# Patient Record
Sex: Female | Born: 1945 | Race: White | Hispanic: No | Smoking: Former smoker
Health system: Southern US, Community
[De-identification: ages and names within clinical notes are randomized; demographics above are authoritative.]

## PROBLEM LIST (undated history)

## (undated) DIAGNOSIS — C801 Malignant (primary) neoplasm, unspecified: Secondary | ICD-10-CM

## (undated) DIAGNOSIS — M546 Pain in thoracic spine: Secondary | ICD-10-CM

## (undated) DIAGNOSIS — M810 Age-related osteoporosis without current pathological fracture: Secondary | ICD-10-CM

## (undated) DIAGNOSIS — T7840XA Allergy, unspecified, initial encounter: Secondary | ICD-10-CM

## (undated) DIAGNOSIS — F32A Depression, unspecified: Secondary | ICD-10-CM

## (undated) DIAGNOSIS — I10 Essential (primary) hypertension: Secondary | ICD-10-CM

## (undated) DIAGNOSIS — F329 Major depressive disorder, single episode, unspecified: Secondary | ICD-10-CM

## (undated) DIAGNOSIS — M549 Dorsalgia, unspecified: Secondary | ICD-10-CM

## (undated) DIAGNOSIS — G8929 Other chronic pain: Secondary | ICD-10-CM

## (undated) HISTORY — DX: Allergy, unspecified, initial encounter: T78.40XA

## (undated) HISTORY — DX: Age-related osteoporosis without current pathological fracture: M81.0

## (undated) HISTORY — PX: TOTAL ABDOMINAL HYSTERECTOMY W/ BILATERAL SALPINGOOPHORECTOMY: SHX83

## (undated) HISTORY — DX: Other chronic pain: G89.29

## (undated) HISTORY — DX: Malignant (primary) neoplasm, unspecified: C80.1

## (undated) HISTORY — DX: Pain in thoracic spine: M54.6

## (undated) HISTORY — DX: Essential (primary) hypertension: I10

## (undated) HISTORY — PX: ABDOMINAL HYSTERECTOMY: SHX81

## (undated) HISTORY — DX: Major depressive disorder, single episode, unspecified: F32.9

## (undated) HISTORY — DX: Depression, unspecified: F32.A

## (undated) HISTORY — DX: Dorsalgia, unspecified: M54.9

---

## 1999-07-17 ENCOUNTER — Ambulatory Visit (HOSPITAL_COMMUNITY): Admission: RE | Admit: 1999-07-17 | Discharge: 1999-07-17 | Payer: Self-pay | Admitting: Family Medicine

## 1999-07-17 ENCOUNTER — Encounter: Payer: Self-pay | Admitting: Family Medicine

## 2005-01-22 ENCOUNTER — Ambulatory Visit: Payer: Self-pay | Admitting: Otolaryngology

## 2006-06-11 ENCOUNTER — Ambulatory Visit: Payer: Self-pay | Admitting: Family Medicine

## 2006-12-24 ENCOUNTER — Ambulatory Visit: Payer: Self-pay | Admitting: Gastroenterology

## 2008-01-29 HISTORY — PX: BREAST LUMPECTOMY: SHX2

## 2008-05-04 ENCOUNTER — Ambulatory Visit: Payer: Self-pay | Admitting: Family Medicine

## 2009-10-09 ENCOUNTER — Ambulatory Visit: Payer: Self-pay | Admitting: Internal Medicine

## 2009-10-11 ENCOUNTER — Ambulatory Visit: Payer: Self-pay | Admitting: Family Medicine

## 2009-10-13 ENCOUNTER — Ambulatory Visit: Payer: Self-pay | Admitting: Internal Medicine

## 2009-10-15 ENCOUNTER — Ambulatory Visit: Payer: Self-pay | Admitting: Internal Medicine

## 2009-10-19 ENCOUNTER — Ambulatory Visit: Payer: Self-pay | Admitting: Family Medicine

## 2012-10-15 ENCOUNTER — Ambulatory Visit: Payer: Self-pay | Admitting: Family Medicine

## 2012-11-10 ENCOUNTER — Emergency Department: Payer: Self-pay | Admitting: Emergency Medicine

## 2013-03-19 ENCOUNTER — Ambulatory Visit: Payer: Self-pay | Admitting: Gastroenterology

## 2013-03-19 LAB — HM COLONOSCOPY

## 2014-11-18 ENCOUNTER — Other Ambulatory Visit: Payer: Self-pay | Admitting: Family Medicine

## 2014-11-18 NOTE — Telephone Encounter (Signed)
Called patient, no answer, unable to leave message.

## 2014-11-18 NOTE — Telephone Encounter (Signed)
Needs an appointment. Will get her enough medicine to make it to appointment when it's booked.   

## 2014-11-21 NOTE — Telephone Encounter (Signed)
No answer, voicemail left to notify patient that she needs an appointment scheduled, then enough medication will be sent to pharmacy.

## 2014-11-23 ENCOUNTER — Other Ambulatory Visit: Payer: Self-pay

## 2014-11-23 MED ORDER — METOPROLOL SUCCINATE ER 25 MG PO TB24: 25.0000 mg | ORAL_TABLET | Freq: Every day | ORAL | Status: DC

## 2014-11-23 MED ORDER — BENAZEPRIL HCL 40 MG PO TABS: 40.0000 mg | ORAL_TABLET | Freq: Every day | ORAL | Status: DC

## 2014-11-23 NOTE — Telephone Encounter (Signed)
Pt scheduled for oct 31 and would like meds to go to [facility]

## 2014-11-23 NOTE — Telephone Encounter (Signed)
No answer, left voicemail to notify patient that she needs an appointment then the medication will be sent over

## 2014-11-23 NOTE — Telephone Encounter (Addendum)
LAST VISIT: 12/16/2013 PRACTICE PARTNER: [id number]  Patient requests benazepril hcl 40 mg tab and metoprolol succ 25 mg tab.

## 2014-11-23 NOTE — Telephone Encounter (Signed)
Your patient.  Thanks 

## 2014-11-28 ENCOUNTER — Ambulatory Visit (INDEPENDENT_AMBULATORY_CARE_PROVIDER_SITE_OTHER): Payer: Managed Care, Other (non HMO) | Admitting: Family Medicine

## 2014-11-28 ENCOUNTER — Encounter: Payer: Self-pay | Admitting: Family Medicine

## 2014-11-28 VITALS — BP 139/84 | HR 69 | Temp 98.7°F | Ht 59.4 in | Wt 113.0 lb

## 2014-11-28 DIAGNOSIS — Z23 Encounter for immunization: Secondary | ICD-10-CM | POA: Diagnosis not present

## 2014-11-28 DIAGNOSIS — M545 Low back pain, unspecified: Secondary | ICD-10-CM | POA: Insufficient documentation

## 2014-11-28 DIAGNOSIS — I10 Essential (primary) hypertension: Secondary | ICD-10-CM | POA: Insufficient documentation

## 2014-11-28 DIAGNOSIS — M5442 Lumbago with sciatica, left side: Secondary | ICD-10-CM | POA: Diagnosis not present

## 2014-11-28 MED ORDER — TRAMADOL HCL 50 MG PO TABS: 50.0000 mg | ORAL_TABLET | Freq: Four times a day (QID) | ORAL | Status: DC | PRN

## 2014-11-28 MED ORDER — SERTRALINE HCL 50 MG PO TABS: 50.0000 mg | ORAL_TABLET | Freq: Every day | ORAL | Status: DC

## 2014-11-28 NOTE — Assessment & Plan Note (Signed)
Discuss back pain care and treatment Exercise regularly Limit use of tramadol and cautions may mix with Tylenol Patient can use Advil or Aleve because of some upset stomach.

## 2014-11-28 NOTE — Assessment & Plan Note (Signed)
The current medical regimen is effective;  continue present plan and medications.  

## 2014-11-28 NOTE — Progress Notes (Signed)
BP 139/84 mmHg  Pulse 69  Temp(Src) 98.7 F (37.1 C)  Ht 4' 11.4" (1.509 m)  Wt 113 lb (51.256 kg)  BMI 22.51 kg/m2  SpO2 97%   Subjective:    Patient ID: Casey Summers, female    DOB: March 12, 1945, 69 y.o.   MRN: [id number]  HPI: Casey Summers is a 69 y.o. female  Chief Complaint  Patient presents with  . Hypertension    medication refill  . Depression    medication refill   patient follow-up blood pressures been doing well taking medications faithfully no side effects Depression doing well on sertraline with no side effects sleeping well. pt now retired and doing better Bothered now primarily with some mild left low back pain with slight radiation into left hip area no other numbness irritation is a little better with walking takes tramadol half a tablet twice a day which seems to control symptoms.  Relevant past medical, surgical, family and social history reviewed and updated as indicated. Interim medical history since our last visit reviewed. Allergies and medications reviewed and updated.  Review of Systems  Constitutional: Negative.   Respiratory: Negative.   Cardiovascular: Negative.     Per HPI unless specifically indicated above     Objective:    BP 139/84 mmHg  Pulse 69  Temp(Src) 98.7 F (37.1 C)  Ht 4' 11.4" (1.509 m)  Wt 113 lb (51.256 kg)  BMI 22.51 kg/m2  SpO2 97%  Wt Readings from Last 3 Encounters:  11/28/14 113 lb (51.256 kg)  12/16/13 110 lb (49.896 kg)    Physical Exam  Constitutional: She is oriented to person, place, and time. She appears well-developed and well-nourished. No distress.  HENT:  Head: Normocephalic and atraumatic.  Right Ear: Hearing normal.  Left Ear: Hearing normal.  Nose: Nose normal.  Eyes: Conjunctivae and lids are normal. Right eye exhibits no discharge. Left eye exhibits no discharge. No scleral icterus.  Pulmonary/Chest: Effort normal. No respiratory distress.  Musculoskeletal: Normal range of  motion.  Back with full range of motion no neuromuscular deficits DTR normal with normal strength and sensation  Neurological: She is alert and oriented to person, place, and time.  Skin: Skin is intact. No rash noted.  Psychiatric: She has a normal mood and affect. Her speech is normal and behavior is normal. Judgment and thought content normal. Cognition and memory are normal.    No results found for this or any previous visit.    Assessment & Plan:   Problem List Items Addressed This Visit      Cardiovascular and Mediastinum   Hypertension    The current medical regimen is effective;  continue present plan and medications.       Relevant Medications   aspirin 81 MG tablet     Other   Low back pain    Discuss back pain care and treatment Exercise regularly Limit use of tramadol and cautions may mix with Tylenol Patient can use Advil or Aleve because of some upset stomach.      Relevant Medications   aspirin 81 MG tablet   traMADol (ULTRAM) 50 MG tablet    Other Visit Diagnoses    Immunization due    -  Primary    Relevant Orders    Flu Vaccine QUAD 36+ mos PF IM (Fluarix & Fluzone Quad PF) (Completed)        Follow up plan: Return for Physical Exam this winter.

## 2015-01-09 ENCOUNTER — Telehealth: Payer: Self-pay | Admitting: Family Medicine

## 2015-01-09 DIAGNOSIS — M810 Age-related osteoporosis without current pathological fracture: Secondary | ICD-10-CM

## 2015-01-09 NOTE — Telephone Encounter (Signed)
Pt would like a call back to discuss what she should do because her bone density results weren't good.

## 2015-01-11 NOTE — Telephone Encounter (Signed)
Phone call Patient with home [facility] screening was noted to have low bone density wants to have bone density test will schedule

## 2015-01-11 NOTE — Addendum Note (Signed)
Addended byGolden Pop on: 01/11/2015 05:13 PM   Modules accepted: Orders

## 2015-01-31 ENCOUNTER — Telehealth: Payer: Self-pay | Admitting: Family Medicine

## 2015-01-31 ENCOUNTER — Other Ambulatory Visit: Payer: Self-pay

## 2015-01-31 DIAGNOSIS — M810 Age-related osteoporosis without current pathological fracture: Secondary | ICD-10-CM

## 2015-01-31 NOTE — Telephone Encounter (Signed)
Pt came in stated she needs a Osteoporosis testing scheduled ASAP. Please call pt regarding this. Thanks.

## 2015-02-02 ENCOUNTER — Encounter: Payer: Self-pay | Admitting: Family Medicine

## 2015-02-02 ENCOUNTER — Ambulatory Visit
Admission: RE | Admit: 2015-02-02 | Discharge: 2015-02-02 | Disposition: A | Discharge status: Home / Self Care | Payer: 59 | Source: Ambulatory Visit | Attending: Family Medicine | Admitting: Family Medicine

## 2015-02-02 DIAGNOSIS — M810 Age-related osteoporosis without current pathological fracture: Secondary | ICD-10-CM | POA: Diagnosis not present

## 2015-02-02 MED ORDER — RALOXIFENE HCL 60 MG PO TABS
60.0000 mg | ORAL_TABLET | Freq: Every day | ORAL | Status: DC
Start: 2015-02-02 — End: 2015-05-24

## 2015-02-02 NOTE — Telephone Encounter (Signed)
Phone call Discussed with patient osteoporosis patient's had partial treatment almost 10 years ago Had partial treatment with tamoxifen for breast cancer but unable to complete due to side effects We'll use Evista to treat osteoporosis and for primary breast cancer prevention

## 2015-02-02 NOTE — Telephone Encounter (Signed)
-----   Message from Wynn Maudlin, [place] sent at 02/02/2015 12:04 PM EST ----- Bone density

## 2015-03-01 ENCOUNTER — Telehealth: Payer: Self-pay | Admitting: Family Medicine

## 2015-03-01 NOTE — Telephone Encounter (Signed)
Pt called stated that she needs a referral or order sent to [facility] for her mammogram. Please call pt with any issues. Thanks.

## 2015-03-02 ENCOUNTER — Other Ambulatory Visit: Payer: Self-pay

## 2015-03-02 NOTE — Telephone Encounter (Signed)
Expand All Collapse All   Pt called stated that she needs a referral or order sent to [facility] for her mammogram. Please call pt with any issues. Thanks.

## 2015-03-02 NOTE — Telephone Encounter (Signed)
Order faxed.

## 2015-03-06 ENCOUNTER — Other Ambulatory Visit: Payer: Self-pay

## 2015-03-06 DIAGNOSIS — Z853 Personal history of malignant neoplasm of breast: Secondary | ICD-10-CM

## 2015-03-06 DIAGNOSIS — Z1239 Encounter for other screening for malignant neoplasm of breast: Secondary | ICD-10-CM

## 2015-03-22 ENCOUNTER — Encounter: Payer: Self-pay | Admitting: Family Medicine

## 2015-03-22 ENCOUNTER — Encounter: Payer: Medicare Other | Admitting: Family Medicine

## 2015-05-24 ENCOUNTER — Ambulatory Visit (INDEPENDENT_AMBULATORY_CARE_PROVIDER_SITE_OTHER): Payer: 59 | Admitting: Family Medicine

## 2015-05-24 ENCOUNTER — Encounter: Payer: Self-pay | Admitting: Family Medicine

## 2015-05-24 VITALS — BP 163/91 | HR 71 | Ht 59.5 in | Wt 114.0 lb

## 2015-05-24 DIAGNOSIS — Z23 Encounter for immunization: Secondary | ICD-10-CM | POA: Diagnosis not present

## 2015-05-24 DIAGNOSIS — I10 Essential (primary) hypertension: Secondary | ICD-10-CM

## 2015-05-24 DIAGNOSIS — M5442 Lumbago with sciatica, left side: Secondary | ICD-10-CM

## 2015-05-24 MED ORDER — METOPROLOL SUCCINATE ER 25 MG PO TB24: 25.0000 mg | ORAL_TABLET | Freq: Every day | ORAL | Status: DC

## 2015-05-24 MED ORDER — TRAMADOL HCL 50 MG PO TABS: 50.0000 mg | ORAL_TABLET | Freq: Four times a day (QID) | ORAL | Status: DC | PRN

## 2015-05-24 MED ORDER — SERTRALINE HCL 50 MG PO TABS: 50.0000 mg | ORAL_TABLET | Freq: Every day | ORAL | Status: DC

## 2015-05-24 MED ORDER — BENAZEPRIL HCL 40 MG PO TABS: 40.0000 mg | ORAL_TABLET | Freq: Every day | ORAL | Status: DC

## 2015-05-24 MED ORDER — AMLODIPINE BESYLATE 5 MG PO TABS: 5.0000 mg | ORAL_TABLET | Freq: Every day | ORAL | Status: DC

## 2015-05-24 NOTE — Assessment & Plan Note (Signed)
Has hypertension poor control will add amlodipine 5 mg continue other current medications.

## 2015-05-24 NOTE — Assessment & Plan Note (Signed)
Ongoing chronic low back pain hurts a lot takes tramadol one a day.

## 2015-05-24 NOTE — Progress Notes (Signed)
   BP 163/91 mmHg  Pulse 71  Ht 4' 11.5" (1.511 m)  Wt 114 lb (51.71 kg)  BMI 22.65 kg/m2  SpO2 99%   Subjective:    Patient ID: Casey Summers, female    DOB: 03-28-1945, 70 y.o.   MRN: [id number]  HPI: [name] is a 70 y.o. female  Chief Complaint  Patient presents with  . Hypertension  Blood pressure patient with great deal of stress ongoing taking medications faithfully with no side effects  Back pain stable taking tramadol one a day seems to do okay.  Not taking Evista as is developed breast soreness taking extra calcium.  Depression doing okay taking sertraline without problems  Relevant past medical, surgical, family and social history reviewed and updated as indicated. Interim medical history since our last visit reviewed. Allergies and medications reviewed and updated.  Review of Systems  Constitutional: Negative.   Respiratory: Negative.   Cardiovascular: Negative.     Per HPI unless specifically indicated above     Objective:    BP 163/91 mmHg  Pulse 71  Ht 4' 11.5" (1.511 m)  Wt 114 lb (51.71 kg)  BMI 22.65 kg/m2  SpO2 99%  Wt Readings from Last 3 Encounters:  05/24/15 114 lb (51.71 kg)  11/28/14 113 lb (51.256 kg)  12/16/13 110 lb (49.896 kg)    Physical Exam  Constitutional: She is oriented to person, place, and time. She appears well-developed and well-nourished. No distress.  HENT:  Head: Normocephalic and atraumatic.  Right Ear: Hearing normal.  Left Ear: Hearing normal.  Nose: Nose normal.  Eyes: Conjunctivae and lids are normal. Right eye exhibits no discharge. Left eye exhibits no discharge. No scleral icterus.  Cardiovascular: Normal rate, regular rhythm and normal heart sounds.   Pulmonary/Chest: Effort normal and breath sounds normal. No respiratory distress.  Musculoskeletal: Normal range of motion.  Neurological: She is alert and oriented to person, place, and time.  Skin: Skin is intact. No rash noted.  Psychiatric:  She has a normal mood and affect. Her speech is normal and behavior is normal. Judgment and thought content normal. Cognition and memory are normal.    No results found for this or any previous visit.    Assessment & Plan:   Problem List Items Addressed This Visit      Cardiovascular and Mediastinum   Hypertension    Has hypertension poor control will add amlodipine 5 mg continue other current medications.      Relevant Medications   metoprolol succinate (TOPROL-XL) 25 MG 24 hr tablet   benazepril (LOTENSIN) 40 MG tablet   amLODipine (NORVASC) 5 MG tablet     Other   Low back pain    Ongoing chronic low back pain hurts a lot takes tramadol one a day.      Relevant Medications   traMADol (ULTRAM) 50 MG tablet    Other Visit Diagnoses    Immunization due    -  Primary    Relevant Orders    Pneumococcal conjugate vaccine 13-valent IM (Completed)        Follow up plan: Return in about 4 weeks (around 06/21/2015) for Physical Exam.

## 2015-06-14 DIAGNOSIS — S93492A Sprain of other ligament of left ankle, initial encounter: Secondary | ICD-10-CM | POA: Diagnosis not present

## 2015-06-20 DIAGNOSIS — S93412D Sprain of calcaneofibular ligament of left ankle, subsequent encounter: Secondary | ICD-10-CM | POA: Diagnosis not present

## 2015-07-19 ENCOUNTER — Encounter: Payer: Self-pay | Admitting: Family Medicine

## 2015-07-19 ENCOUNTER — Ambulatory Visit (INDEPENDENT_AMBULATORY_CARE_PROVIDER_SITE_OTHER): Payer: 59 | Admitting: Family Medicine

## 2015-07-19 VITALS — BP 125/76 | HR 72 | Temp 98.4°F | Ht 60.3 in | Wt 115.0 lb

## 2015-07-19 DIAGNOSIS — M5442 Lumbago with sciatica, left side: Secondary | ICD-10-CM | POA: Diagnosis not present

## 2015-07-19 DIAGNOSIS — Z23 Encounter for immunization: Secondary | ICD-10-CM

## 2015-07-19 DIAGNOSIS — I10 Essential (primary) hypertension: Secondary | ICD-10-CM

## 2015-07-19 DIAGNOSIS — Z0000 Encounter for general adult medical examination without abnormal findings: Secondary | ICD-10-CM

## 2015-07-19 DIAGNOSIS — L918 Other hypertrophic disorders of the skin: Secondary | ICD-10-CM | POA: Diagnosis not present

## 2015-07-19 LAB — URINALYSIS, ROUTINE W REFLEX MICROSCOPIC
BILIRUBIN UA: NEGATIVE
Glucose, UA: NEGATIVE
Ketones, UA: NEGATIVE
Nitrite, UA: NEGATIVE
PH UA: 7 (ref 5.0–7.5)
Protein, UA: NEGATIVE
RBC UA: NEGATIVE
Specific Gravity, UA: 1.015 (ref 1.005–1.030)
Urobilinogen, Ur: 0.2 mg/dL (ref 0.2–1.0)

## 2015-07-19 LAB — MICROSCOPIC EXAMINATION

## 2015-07-19 MED ORDER — SERTRALINE HCL 50 MG PO TABS: 50.0000 mg | ORAL_TABLET | Freq: Every day | ORAL | Status: DC

## 2015-07-19 MED ORDER — METOPROLOL SUCCINATE ER 25 MG PO TB24: 25.0000 mg | ORAL_TABLET | Freq: Every day | ORAL | Status: DC

## 2015-07-19 MED ORDER — AMLODIPINE BESYLATE 5 MG PO TABS: 5.0000 mg | ORAL_TABLET | Freq: Every day | ORAL | Status: DC

## 2015-07-19 MED ORDER — TRAMADOL HCL 50 MG PO TABS: 50.0000 mg | ORAL_TABLET | Freq: Four times a day (QID) | ORAL | Status: DC | PRN

## 2015-07-19 MED ORDER — BENAZEPRIL HCL 40 MG PO TABS: 40.0000 mg | ORAL_TABLET | Freq: Every day | ORAL | Status: DC

## 2015-07-19 NOTE — Patient Instructions (Signed)
Tdap Vaccine (Tetanus, Diphtheria and Pertussis): What You Need to Know 1. Why get vaccinated? Tetanus, diphtheria and pertussis are very serious diseases. Tdap vaccine can protect us from these diseases. And, Tdap vaccine given to pregnant women can protect newborn babies against pertussis. TETANUS (Lockjaw) is rare in the [place] today. It causes painful muscle tightening and stiffness, usually all over the body.  It can lead to tightening of muscles in the head and neck so you can't open your mouth, swallow, or sometimes even breathe. Tetanus kills about 1 out of 10 people who are infected even after receiving the best medical care. DIPHTHERIA is also rare in the [place] today. It can cause a thick coating to form in the back of the throat.  It can lead to breathing problems, heart failure, paralysis, and death. PERTUSSIS (Whooping Cough) causes severe coughing spells, which can cause difficulty breathing, vomiting and disturbed sleep.  It can also lead to weight loss, incontinence, and rib fractures. Up to 2 in 100 adolescents and 5 in 100 adults with pertussis are hospitalized or have complications, which could include pneumonia or death. These diseases are caused by bacteria. Diphtheria and pertussis are spread from person to person through secretions from coughing or sneezing. Tetanus enters the body through cuts, scratches, or wounds. Before vaccines, as many as 200,000 cases of diphtheria, 200,000 cases of pertussis, and hundreds of cases of tetanus, were reported in the [place] each year. Since vaccination began, reports of cases for tetanus and diphtheria have dropped by about 99% and for pertussis by about 80%. 2. Tdap vaccine Tdap vaccine can protect adolescents and adults from tetanus, diphtheria, and pertussis. One dose of Tdap is routinely given at age 11 or 12. People who did not get Tdap at that age should get it as soon as possible. Tdap is especially important  for healthcare professionals and anyone having close contact with a baby younger than 12 months. Pregnant women should get a dose of Tdap during every pregnancy, to protect the newborn from pertussis. Infants are most at risk for severe, life-threatening complications from pertussis. Another vaccine, called Td, protects against tetanus and diphtheria, but not pertussis. A Td booster should be given every 10 years. Tdap may be given as one of these boosters if you have never gotten Tdap before. Tdap may also be given after a severe cut or burn to prevent tetanus infection. Your doctor or the person giving you the vaccine can give you more information. Tdap may safely be given at the same time as other vaccines. 3. Some people should not get this vaccine  A person who has ever had a life-threatening allergic reaction after a previous dose of any diphtheria, tetanus or pertussis containing vaccine, OR has a severe allergy to any part of this vaccine, should not get Tdap vaccine. Tell the person giving the vaccine about any severe allergies.  Anyone who had coma or long repeated seizures within 7 days after a childhood dose of DTP or DTaP, or a previous dose of Tdap, should not get Tdap, unless a cause other than the vaccine was found. They can still get Td.  Talk to your doctor if you:  have seizures or another nervous system problem,  had severe pain or swelling after any vaccine containing diphtheria, tetanus or pertussis,  ever had a condition called Guillain-Barr Syndrome (GBS),  aren't feeling well on the day the shot is scheduled. 4. Risks With any medicine, including vaccines, there is   a chance of side effects. These are usually mild and go away on their own. Serious reactions are also possible but are rare. Most people who get Tdap vaccine do not have any problems with it. Mild problems following Tdap (Did not interfere with activities)  Pain where the shot was given (about 3 in 4  adolescents or 2 in 3 adults)  Redness or swelling where the shot was given (about 1 person in 5)  Mild fever of at least 100.4F (up to about 1 in 25 adolescents or 1 in 100 adults)  Headache (about 3 or 4 people in 10)  Tiredness (about 1 person in 3 or 4)  Nausea, vomiting, diarrhea, stomach ache (up to 1 in 4 adolescents or 1 in 10 adults)  Chills, sore joints (about 1 person in 10)  Body aches (about 1 person in 3 or 4)  Rash, swollen glands (uncommon) Moderate problems following Tdap (Interfered with activities, but did not require medical attention)  Pain where the shot was given (up to 1 in 5 or 6)  Redness or swelling where the shot was given (up to about 1 in 16 adolescents or 1 in 12 adults)  Fever over 102F (about 1 in 100 adolescents or 1 in 250 adults)  Headache (about 1 in 7 adolescents or 1 in 10 adults)  Nausea, vomiting, diarrhea, stomach ache (up to 1 or 3 people in 100)  Swelling of the entire arm where the shot was given (up to about 1 in 500). Severe problems following Tdap (Unable to perform usual activities; required medical attention)  Swelling, severe pain, bleeding and redness in the arm where the shot was given (rare). Problems that could happen after any vaccine:  People sometimes faint after a medical procedure, including vaccination. Sitting or lying down for about 15 minutes can help prevent fainting, and injuries caused by a fall. Tell your doctor if you feel dizzy, or have vision changes or ringing in the ears.  Some people get severe pain in the shoulder and have difficulty moving the arm where a shot was given. This happens very rarely.  Any medication can cause a severe allergic reaction. Such reactions from a vaccine are very rare, estimated at fewer than 1 in a million doses, and would happen within a few minutes to a few hours after the vaccination. As with any medicine, there is a very remote chance of a vaccine causing a serious  injury or death. The safety of vaccines is always being monitored. For more information, visit: www.cdc.gov/vaccinesafety/ 5. What if there is a serious problem? What should I look for?  Look for anything that concerns you, such as signs of a severe allergic reaction, very high fever, or unusual behavior.  Signs of a severe allergic reaction can include hives, swelling of the face and throat, difficulty breathing, a fast heartbeat, dizziness, and weakness. These would usually start a few minutes to a few hours after the vaccination. What should I do?  If you think it is a severe allergic reaction or other emergency that can't wait, call 9-1-1 or get the person to the nearest hospital. Otherwise, call your doctor.  Afterward, the reaction should be reported to the Vaccine Adverse Event Reporting System (VAERS). Your doctor might file this report, or you can do it yourself through the VAERS web site at www.vaers.hhs.gov, or by calling 1-800-822-7967. VAERS does not give medical advice.  6. The National Vaccine Injury Compensation Program The National Vaccine Injury Compensation Program (  VICP) is a federal program that was created to compensate people who may have been injured by certain vaccines. Persons who believe they may have been injured by a vaccine can learn about the program and about filing a claim by calling 1-800-338-2382 or visiting the VICP website at www.hrsa.gov/vaccinecompensation. There is a time limit to file a claim for compensation. 7. How can I learn more?  Ask your doctor. He or she can give you the vaccine package insert or suggest other sources of information.  Call your local or state health department.  Contact the Centers for Disease Control and Prevention (CDC):  Call 1-800-232-4636 (1-800-CDC-INFO) or  Visit CDC's website at www.cdc.gov/vaccines CDC Tdap Vaccine VIS (03/23/13)   This information is not intended to replace advice given to you by your health care  provider. Make sure you discuss any questions you have with your health care provider.   Document Released: 07/16/2011 Document Revised: 02/04/2014 Document Reviewed: 04/28/2013 Elsevier Interactive Patient Education 2016 [employer]vier Inc.  

## 2015-07-19 NOTE — Assessment & Plan Note (Signed)
The current medical regimen is effective;  continue present plan and medications.  

## 2015-07-19 NOTE — Progress Notes (Signed)
BP 125/76 mmHg  Pulse 72  Temp(Src) 98.4 F (36.9 C)  Ht 5' 0.3" (1.532 m)  Wt 115 lb (52.164 kg)  BMI 22.23 kg/m2  SpO2 98%   Subjective:    Patient ID: Casey Summers, female    DOB: 1945/12/23, 70 y.o.   MRN: [id number]  HPI: Casey Summers is a 70 y.o. female  Chief Complaint  Patient presents with  . Annual Exam   Patient all in all doing very well blood pressure good control with no side effects from medications taken faithfully. Nerves depression doing well on low-dose sertraline Low back pain intermittently has used tramadol in the past like to break her tramadol in half and take that on occasions last generic she got was not able to break and Upsets Her Stomach so Hasn't Taken Much  Relevant past medical, surgical, family and social history reviewed and updated as indicated. Interim medical history since our last visit reviewed. Allergies and medications reviewed and updated.  Review of Systems  Constitutional: Negative.   HENT: Negative.   Eyes: Negative.   Respiratory: Negative.   Cardiovascular: Negative.   Gastrointestinal: Negative.   Endocrine: Negative.   Genitourinary: Negative.   Musculoskeletal: Negative.   Skin: Negative.   Allergic/Immunologic: Negative.   Neurological: Negative.   Hematological: Negative.   Psychiatric/Behavioral: Negative.     Per HPI unless specifically indicated above     Objective:    BP 125/76 mmHg  Pulse 72  Temp(Src) 98.4 F (36.9 C)  Ht 5' 0.3" (1.532 m)  Wt 115 lb (52.164 kg)  BMI 22.23 kg/m2  SpO2 98%  Wt Readings from Last 3 Encounters:  07/19/15 115 lb (52.164 kg)  05/24/15 114 lb (51.71 kg)  11/28/14 113 lb (51.256 kg)    Physical Exam  Constitutional: She is oriented to person, place, and time. She appears well-developed and well-nourished.  HENT:  Head: Normocephalic and atraumatic.  Right Ear: External ear normal.  Left Ear: External ear normal.  Nose: Nose normal.  Mouth/Throat:  Oropharynx is clear and moist.  Eyes: Conjunctivae and EOM are normal. Pupils are equal, round, and reactive to light.  Neck: Normal range of motion. Neck supple. Carotid bruit is not present.  Cardiovascular: Normal rate, regular rhythm and normal heart sounds.   No murmur heard. Pulmonary/Chest: Effort normal and breath sounds normal. She exhibits no mass. Right breast exhibits no mass, no skin change and no tenderness. Left breast exhibits no mass, no skin change and no tenderness. Breasts are symmetrical.  Abdominal: Soft. Bowel sounds are normal. There is no hepatosplenomegaly.  Musculoskeletal: Normal range of motion.  Neurological: She is alert and oriented to person, place, and time.  Skin: No rash noted.  Skin tag right anterior chest occasionally gets caught and bleed destroyed with cryo-unit patient tolerated procedure well.  Psychiatric: She has a normal mood and affect. Her behavior is normal. Judgment and thought content normal.    No results found for this or any previous visit.    Assessment & Plan:   Problem List Items Addressed This Visit      Cardiovascular and Mediastinum   Hypertension    The current medical regimen is effective;  continue present plan and medications.       Relevant Medications   amLODipine (NORVASC) 5 MG tablet   benazepril (LOTENSIN) 40 MG tablet   metoprolol succinate (TOPROL-XL) 25 MG 24 hr tablet     Other   Low back pain  The current medical regimen is effective;  continue present plan and medications.       Relevant Medications   traMADol (ULTRAM) 50 MG tablet    Other Visit Diagnoses    Well adult exam    -  Primary    Relevant Orders    CBC with Differential/Platelet    Comprehensive metabolic panel    Lipid Panel w/o Chol/HDL Ratio    TSH    Urinalysis, Routine w reflex microscopic (not at [facility])    Healthcare maintenance        Relevant Orders    Hepatitis C Antibody    Need for Tdap vaccination        Relevant  Orders    Tdap vaccine greater than or equal to 7yo IM (Completed)    Cutaneous skin tags            Follow up plan: Return in about 6 months (around 01/18/2016) for bmp.

## 2015-07-20 LAB — HEPATITIS C ANTIBODY

## 2015-07-21 ENCOUNTER — Telehealth: Payer: Self-pay | Admitting: Family Medicine

## 2015-07-21 LAB — CBC WITH DIFFERENTIAL/PLATELET
BASOS: 0 %
Basophils Absolute: 0 10*3/uL (ref 0.0–0.2)
EOS (ABSOLUTE): 0.1 10*3/uL (ref 0.0–0.4)
EOS: 1 %
HEMATOCRIT: 39.2 % (ref 34.0–46.6)
Hemoglobin: 13.4 g/dL (ref 11.1–15.9)
IMMATURE GRANS (ABS): 0 10*3/uL (ref 0.0–0.1)
IMMATURE GRANULOCYTES: 0 %
LYMPHS: 16 %
Lymphocytes Absolute: 1 10*3/uL (ref 0.7–3.1)
MCH: 33.3 pg — ABNORMAL HIGH (ref 26.6–33.0)
MCHC: 34.2 g/dL (ref 31.5–35.7)
MCV: 98 fL — AB (ref 79–97)
MONOS ABS: 0.5 10*3/uL (ref 0.1–0.9)
Monocytes: 8 %
NEUTROS PCT: 75 %
Neutrophils Absolute: 4.6 10*3/uL (ref 1.4–7.0)
PLATELETS: 217 10*3/uL (ref 150–379)
RBC: 4.02 x10E6/uL (ref 3.77–5.28)
RDW: 12.7 % (ref 12.3–15.4)
WBC: 6.2 10*3/uL (ref 3.4–10.8)

## 2015-07-21 LAB — COMPREHENSIVE METABOLIC PANEL
ALT: 75 IU/L — AB (ref 0–32)
AST: 63 IU/L — AB (ref 0–40)
Albumin/Globulin Ratio: 2 (ref 1.2–2.2)
Albumin: 4.7 g/dL (ref 3.6–4.8)
Alkaline Phosphatase: 110 IU/L (ref 39–117)
BUN/Creatinine Ratio: 16 (ref 12–28)
BUN: 10 mg/dL (ref 8–27)
Bilirubin Total: 0.7 mg/dL (ref 0.0–1.2)
CALCIUM: 9.9 mg/dL (ref 8.7–10.3)
CO2: 22 mmol/L (ref 18–29)
Chloride: 101 mmol/L (ref 96–106)
Creatinine, Ser: 0.62 mg/dL (ref 0.57–1.00)
GFR, EST AFRICAN AMERICAN: 108 mL/min/{1.73_m2} (ref >59)
GFR, EST NON AFRICAN AMERICAN: 94 mL/min/{1.73_m2} (ref >59)
Globulin, Total: 2.3 g/dL (ref 1.5–4.5)
Glucose: 94 mg/dL (ref 65–99)
Potassium: 4.6 mmol/L (ref 3.5–5.2)
Sodium: 139 mmol/L (ref 134–144)
TOTAL PROTEIN: 7 g/dL (ref 6.0–8.5)

## 2015-07-21 LAB — LIPID PANEL W/O CHOL/HDL RATIO
Cholesterol, Total: 190 mg/dL (ref 100–199)
HDL: 91 mg/dL (ref >39)
LDL CALC: 81 mg/dL (ref 0–99)
TRIGLYCERIDES: 90 mg/dL (ref 0–149)
VLDL CHOLESTEROL CAL: 18 mg/dL (ref 5–40)

## 2015-07-21 LAB — TSH: TSH: 0.622 u[IU]/mL (ref 0.450–4.500)

## 2015-07-24 NOTE — Telephone Encounter (Signed)
Patient notified of labs. She said she had a weekend of drinking more than usual.

## 2015-08-04 DIAGNOSIS — H25013 Cortical age-related cataract, bilateral: Secondary | ICD-10-CM | POA: Diagnosis not present

## 2015-10-10 ENCOUNTER — Other Ambulatory Visit: Payer: Self-pay | Admitting: Family Medicine

## 2016-01-18 ENCOUNTER — Ambulatory Visit: Payer: Medicare Other | Admitting: Family Medicine

## 2016-01-24 ENCOUNTER — Other Ambulatory Visit: Payer: Self-pay | Admitting: Family Medicine

## 2016-02-07 ENCOUNTER — Encounter: Payer: Self-pay | Admitting: Family Medicine

## 2016-02-07 ENCOUNTER — Ambulatory Visit (INDEPENDENT_AMBULATORY_CARE_PROVIDER_SITE_OTHER): Payer: 59 | Admitting: Family Medicine

## 2016-02-07 VITALS — BP 136/80 | HR 65 | Temp 97.6°F | Wt 117.0 lb

## 2016-02-07 DIAGNOSIS — I10 Essential (primary) hypertension: Secondary | ICD-10-CM

## 2016-02-07 DIAGNOSIS — M5442 Lumbago with sciatica, left side: Secondary | ICD-10-CM | POA: Diagnosis not present

## 2016-02-07 DIAGNOSIS — Z23 Encounter for immunization: Secondary | ICD-10-CM

## 2016-02-07 DIAGNOSIS — G8929 Other chronic pain: Secondary | ICD-10-CM

## 2016-02-07 MED ORDER — TRAMADOL HCL 50 MG PO TABS: 50.0000 mg | ORAL_TABLET | Freq: Four times a day (QID) | ORAL | 5 refills | Status: DC | PRN

## 2016-02-07 MED ORDER — AMLODIPINE BESYLATE 5 MG PO TABS: 5.0000 mg | ORAL_TABLET | Freq: Every day | ORAL | 12 refills | Status: DC

## 2016-02-07 NOTE — Assessment & Plan Note (Signed)
Discuss hypertension with orthostatic hypotension changes with first standing is demonstrated with blood pressure drop with sitting to standing and then blood pressure raise with prolonged standing. Discuss preventative measures strengthening exercises increase fluids and Valsalva maneuver. Will restart amlodipine 5 mg continue other blood pressure medications

## 2016-02-07 NOTE — Assessment & Plan Note (Signed)
Back pain is improving only take an occasional half tramadol discussed need to continue to taper tramadol usage.

## 2016-02-07 NOTE — Progress Notes (Signed)
BP 136/80 (BP Location: Left Arm)   Pulse 65   Temp 97.6 F (36.4 C)   Wt 117 lb (53.1 kg)   SpO2 96%   BMI 22.62 kg/m    Subjective:    Patient ID: Casey Summers, female    DOB: July 14, 1945, 71 y.o.   MRN: [id number]  HPI: Casey Summers is a 71 y.o. female  Chief Complaint  Patient presents with  . Hypertension    She's been out of her Amlodipine for 5 days.   Patient with hypertension but having some lightheaded dizzy spells when first gets out of bed or stands up after sitting for some time. No dizziness with head position changes or gaze. Amlodipine's been out for several days. Symptoms have not changed with being off amlodipine. Relevant past medical, surgical, family and social history reviewed and updated as indicated. Interim medical history since our last visit reviewed. Allergies and medications reviewed and updated.  Review of Systems  Constitutional: Negative.   Respiratory: Negative.   Cardiovascular: Negative.     Per HPI unless specifically indicated above     Objective:    BP 136/80 (BP Location: Left Arm)   Pulse 65   Temp 97.6 F (36.4 C)   Wt 117 lb (53.1 kg)   SpO2 96%   BMI 22.62 kg/m   Wt Readings from Last 3 Encounters:  02/07/16 117 lb (53.1 kg)  07/19/15 115 lb (52.2 kg)  05/24/15 114 lb (51.7 kg)    Physical Exam  Constitutional: She is oriented to person, place, and time. She appears well-developed and well-nourished. No distress.  HENT:  Head: Normocephalic and atraumatic.  Right Ear: Hearing normal.  Left Ear: Hearing normal.  Nose: Nose normal.  Eyes: Conjunctivae and lids are normal. Right eye exhibits no discharge. Left eye exhibits no discharge. No scleral icterus.  Cardiovascular: Normal rate, regular rhythm and normal heart sounds.   Pulmonary/Chest: Effort normal and breath sounds normal. No respiratory distress.  Musculoskeletal: Normal range of motion.  Neurological: She is alert and oriented to person,  place, and time.  Skin: Skin is intact. No rash noted.  Psychiatric: She has a normal mood and affect. Her speech is normal and behavior is normal. Judgment and thought content normal. Cognition and memory are normal.    Results for orders placed or performed in visit on 07/19/15  Microscopic Examination  Result Value Ref Range   WBC, UA 0-5 0 - 5 /hpf   RBC, UA 0-2 0 - 2 /hpf   Epithelial Cells (non renal) 0-10 0 - 10 /hpf   Mucus, UA Present Not Estab.   Bacteria, UA Few None seen/Few  CBC with Differential/Platelet  Result Value Ref Range   WBC 6.2 3.4 - 10.8 x10E3/uL   RBC 4.02 3.77 - 5.28 x10E6/uL   Hemoglobin 13.4 11.1 - 15.9 g/dL   Hematocrit 39.2 34.0 - 46.6 %   MCV 98 (H) 79 - 97 fL   MCH 33.3 (H) 26.6 - 33.0 pg   MCHC 34.2 31.5 - 35.7 g/dL   RDW 12.7 12.3 - 15.4 %   Platelets 217 150 - 379 x10E3/uL   Neutrophils 75 %   Lymphs 16 %   Monocytes 8 %   Eos 1 %   Basos 0 %   Neutrophils Absolute 4.6 1.4 - 7.0 x10E3/uL   Lymphocytes Absolute 1.0 0.7 - 3.1 x10E3/uL   Monocytes Absolute 0.5 0.1 - 0.9 x10E3/uL   EOS (ABSOLUTE) 0.1 0.0 -  0.4 x10E3/uL   Basophils Absolute 0.0 0.0 - 0.2 x10E3/uL   Immature Granulocytes 0 %   Immature Grans (Abs) 0.0 0.0 - 0.1 x10E3/uL  Comprehensive metabolic panel  Result Value Ref Range   Glucose 94 65 - 99 mg/dL   BUN 10 8 - 27 mg/dL   Creatinine, Ser 0.62 0.57 - 1.00 mg/dL   GFR calc non Af Amer 94 >59 mL/min/1.73   GFR calc Af Amer 108 >59 mL/min/1.73   BUN/Creatinine Ratio 16 12 - 28   Sodium 139 134 - 144 mmol/L   Potassium 4.6 3.5 - 5.2 mmol/L   Chloride 101 96 - 106 mmol/L   CO2 22 18 - 29 mmol/L   Calcium 9.9 8.7 - 10.3 mg/dL   Total Protein 7.0 6.0 - 8.5 g/dL   Albumin 4.7 3.6 - 4.8 g/dL   Globulin, Total 2.3 1.5 - 4.5 g/dL   Albumin/Globulin Ratio 2.0 1.2 - 2.2   Bilirubin Total 0.7 0.0 - 1.2 mg/dL   Alkaline Phosphatase 110 39 - 117 IU/L   AST 63 (H) 0 - 40 IU/L   ALT 75 (H) 0 - 32 IU/L  Lipid Panel w/o Chol/HDL  Ratio  Result Value Ref Range   Cholesterol, Total 190 100 - 199 mg/dL   Triglycerides 90 0 - 149 mg/dL   HDL 91 >39 mg/dL   VLDL Cholesterol Cal 18 5 - 40 mg/dL   LDL Calculated 81 0 - 99 mg/dL  TSH  Result Value Ref Range   TSH 0.622 0.450 - 4.500 uIU/mL  Urinalysis, Routine w reflex microscopic (not at [facility])  Result Value Ref Range   Specific Gravity, UA 1.015 1.005 - 1.030   pH, UA 7.0 5.0 - 7.5   Color, UA Yellow Yellow   Appearance Ur Clear Clear   Leukocytes, UA 1+ (A) Negative   Protein, UA Negative Negative/Trace   Glucose, UA Negative Negative   Ketones, UA Negative Negative   RBC, UA Negative Negative   Bilirubin, UA Negative Negative   Urobilinogen, Ur 0.2 0.2 - 1.0 mg/dL   Nitrite, UA Negative Negative   Microscopic Examination See below:   Hepatitis C Antibody  Result Value Ref Range   Hep C Virus Ab <0.1 0.0 - 0.9 s/co ratio      Assessment & Plan:   Problem List Items Addressed This Visit      Cardiovascular and Mediastinum   Hypertension - Primary    Discuss hypertension with orthostatic hypotension changes with first standing is demonstrated with blood pressure drop with sitting to standing and then blood pressure raise with prolonged standing. Discuss preventative measures strengthening exercises increase fluids and Valsalva maneuver. Will restart amlodipine 5 mg continue other blood pressure medications      Relevant Medications   amLODipine (NORVASC) 5 MG tablet   Other Relevant Orders   Basic metabolic panel     Other   Low back pain    Back pain is improving only take an occasional half tramadol discussed need to continue to taper tramadol usage.      Relevant Medications   traMADol (ULTRAM) 50 MG tablet    Other Visit Diagnoses    Needs flu shot           Follow up plan: Return in about 4 weeks (around 03/06/2016) for Blood pressure dizzy check.

## 2016-02-08 ENCOUNTER — Encounter: Payer: Self-pay | Admitting: Family Medicine

## 2016-02-08 LAB — BASIC METABOLIC PANEL
BUN/Creatinine Ratio: 18 (ref 12–28)
BUN: 12 mg/dL (ref 8–27)
CHLORIDE: 96 mmol/L (ref 96–106)
CO2: 23 mmol/L (ref 18–29)
CREATININE: 0.66 mg/dL (ref 0.57–1.00)
Calcium: 9.4 mg/dL (ref 8.7–10.3)
GFR calc Af Amer: 105 mL/min/{1.73_m2} (ref >59)
GFR calc non Af Amer: 91 mL/min/{1.73_m2} (ref >59)
GLUCOSE: 83 mg/dL (ref 65–99)
Potassium: 4.7 mmol/L (ref 3.5–5.2)
SODIUM: 135 mmol/L (ref 134–144)

## 2016-03-06 ENCOUNTER — Telehealth: Payer: Self-pay | Admitting: Family Medicine

## 2016-03-06 DIAGNOSIS — Z1239 Encounter for other screening for malignant neoplasm of breast: Secondary | ICD-10-CM

## 2016-03-06 NOTE — Telephone Encounter (Signed)
Breast Imaging Appointment line at [facility] [id number] opt 2. Number was given to patient. Called Imaging center, they do  Not need an order.

## 2016-03-06 NOTE — Telephone Encounter (Signed)
Patient called to get a referral to [facility] for her mammogram.  Thank You Santiago Glad

## 2016-03-11 ENCOUNTER — Encounter: Payer: Self-pay | Admitting: Family Medicine

## 2016-03-11 ENCOUNTER — Ambulatory Visit (INDEPENDENT_AMBULATORY_CARE_PROVIDER_SITE_OTHER): Payer: 59 | Admitting: Family Medicine

## 2016-03-11 DIAGNOSIS — I10 Essential (primary) hypertension: Secondary | ICD-10-CM | POA: Diagnosis not present

## 2016-03-11 DIAGNOSIS — G8929 Other chronic pain: Secondary | ICD-10-CM | POA: Diagnosis not present

## 2016-03-11 DIAGNOSIS — M5442 Lumbago with sciatica, left side: Secondary | ICD-10-CM | POA: Diagnosis not present

## 2016-03-11 NOTE — Assessment & Plan Note (Signed)
The current medical regimen is effective;  continue present plan and medications.  

## 2016-03-11 NOTE — Progress Notes (Signed)
   BP 128/74   Pulse 82   Ht 5\' 4"  (1.626 m)   Wt 117 lb (53.1 kg)   SpO2 98%   BMI 20.08 kg/m    Subjective:    Patient ID: Casey Summers, female    DOB: 06/11/1945, 71 y.o.   MRN: [id number]  HPI: [name] is a 71 y.o. female  Chief Complaint  Patient presents with  . Blood Pressure Check  . Hypertension  Taking blood pressure medicines without problems or side effects taking faithfully. Reviewed back pain care and tramadol use and as noted below. Patient doing well with good control.  Relevant past medical, surgical, family and social history reviewed and updated as indicated. Interim medical history since our last visit reviewed. Allergies and medications reviewed and updated.  Review of Systems  Constitutional: Negative.   Respiratory: Negative.   Cardiovascular: Negative.     Per HPI unless specifically indicated above     Objective:    BP 128/74   Pulse 82   Ht 5\' 4"  (1.626 m)   Wt 117 lb (53.1 kg)   SpO2 98%   BMI 20.08 kg/m   Wt Readings from Last 3 Encounters:  03/11/16 117 lb (53.1 kg)  02/07/16 117 lb (53.1 kg)  07/19/15 115 lb (52.2 kg)    Physical Exam  Constitutional: She is oriented to person, place, and time. She appears well-developed and well-nourished. No distress.  HENT:  Head: Normocephalic and atraumatic.  Right Ear: Hearing normal.  Left Ear: Hearing normal.  Nose: Nose normal.  Eyes: Conjunctivae and lids are normal. Right eye exhibits no discharge. Left eye exhibits no discharge. No scleral icterus.  Cardiovascular: Normal rate, regular rhythm and normal heart sounds.   Pulmonary/Chest: Effort normal and breath sounds normal. No respiratory distress.  Musculoskeletal: Normal range of motion.  Neurological: She is alert and oriented to person, place, and time.  Skin: Skin is intact. No rash noted.  Psychiatric: She has a normal mood and affect. Her speech is normal and behavior is normal. Judgment and thought content  normal. Cognition and memory are normal.    Results for orders placed or performed in visit on [id number]  Basic metabolic panel  Result Value Ref Range   Glucose 83 65 - 99 mg/dL   BUN 12 8 - 27 mg/dL   Creatinine, Ser 0.66 0.57 - 1.00 mg/dL   GFR calc non Af Amer 91 >59 mL/min/1.73   GFR calc Af Amer 105 >59 mL/min/1.73   BUN/Creatinine Ratio 18 12 - 28   Sodium 135 134 - 144 mmol/L   Potassium 4.7 3.5 - 5.2 mmol/L   Chloride 96 96 - 106 mmol/L   CO2 23 18 - 29 mmol/L   Calcium 9.4 8.7 - 10.3 mg/dL      Assessment & Plan:   Problem List Items Addressed This Visit      Cardiovascular and Mediastinum   Hypertension    The current medical regimen is effective;  continue present plan and medications.         Other   Low back pain    Currently takes tramadol pretty much a half a tablet every day sometimes an extra half a tablet is taken. This works well to control her pain          Follow up plan: Return for June , Physical Exam.

## 2016-03-11 NOTE — Assessment & Plan Note (Signed)
Currently takes tramadol pretty much a half a tablet every day sometimes an extra half a tablet is taken. This works well to control her pain

## 2016-05-29 ENCOUNTER — Other Ambulatory Visit: Payer: Self-pay | Admitting: Family Medicine

## 2016-07-03 ENCOUNTER — Other Ambulatory Visit: Payer: Self-pay | Admitting: Family Medicine

## 2016-07-03 NOTE — Telephone Encounter (Signed)
Last OV: 03/11/16 Next OV: 07/23/16  BMP Latest Ref Rng & Units 02/07/2016 07/19/2015  Glucose 65 - 99 mg/dL 83 94  BUN 8 - 27 mg/dL 12 10  Creatinine 0.57 - 1.00 mg/dL 0.66 0.62  BUN/Creat Ratio 12 - 28 18 16   Sodium 134 - 144 mmol/L 135 139  Potassium 3.5 - 5.2 mmol/L 4.7 4.6  Chloride 96 - 106 mmol/L 96 101  CO2 18 - 29 mmol/L 23 22  Calcium 8.7 - 10.3 mg/dL 9.4 9.9

## 2016-07-11 ENCOUNTER — Other Ambulatory Visit: Payer: Self-pay | Admitting: Family Medicine

## 2016-07-23 ENCOUNTER — Encounter: Payer: Medicare Other | Admitting: Family Medicine

## 2016-07-30 ENCOUNTER — Other Ambulatory Visit: Payer: Self-pay | Admitting: Family Medicine

## 2016-07-30 NOTE — Telephone Encounter (Signed)
Apt pe 

## 2016-07-30 NOTE — Telephone Encounter (Signed)
Called and spoke to patient she is in [place] She will call back to schedule when she is in town.

## 2016-08-02 ENCOUNTER — Telehealth: Payer: Self-pay | Admitting: Family Medicine

## 2016-08-02 NOTE — Telephone Encounter (Signed)
Patient states Casey Summers or someone had called her regarding scheduling an appt but she needs an appt with Dr Jeananne Rama next week as she will be leaving to go back out of town.  I offered her to see someone else but she did not want to do that.  She said she would like to speak with Casey Summers.  Thanks  [phone]

## 2016-08-05 ENCOUNTER — Ambulatory Visit: Payer: Self-pay | Admitting: Family Medicine

## 2016-08-05 NOTE — Telephone Encounter (Signed)
Pt called back and she is unable to make it to office today as she is in [place] still.

## 2016-08-05 NOTE — Telephone Encounter (Signed)
Attempted to reach pt on both lines to see if she could come in today at 1 p.m. For annual exam since we had a cancellation. Was not able to reach pt.

## 2016-08-05 NOTE — Telephone Encounter (Signed)
Patient returned call. Patient stated that she is in [place] and will be in [place] until the end of July. Scheduled patient for Thursday 08/22/2016.

## 2016-08-22 ENCOUNTER — Encounter: Payer: Self-pay | Admitting: Family Medicine

## 2016-08-22 ENCOUNTER — Ambulatory Visit (INDEPENDENT_AMBULATORY_CARE_PROVIDER_SITE_OTHER): Payer: Medicare Other | Admitting: Family Medicine

## 2016-08-22 VITALS — BP 116/74 | HR 82 | Ht 60.63 in | Wt 115.0 lb

## 2016-08-22 DIAGNOSIS — G8929 Other chronic pain: Secondary | ICD-10-CM | POA: Diagnosis not present

## 2016-08-22 DIAGNOSIS — Z1329 Encounter for screening for other suspected endocrine disorder: Secondary | ICD-10-CM

## 2016-08-22 DIAGNOSIS — Z9079 Acquired absence of other genital organ(s): Secondary | ICD-10-CM

## 2016-08-22 DIAGNOSIS — Z13220 Encounter for screening for lipoid disorders: Secondary | ICD-10-CM

## 2016-08-22 DIAGNOSIS — Z170 Estrogen receptor positive status [ER+]: Secondary | ICD-10-CM | POA: Diagnosis not present

## 2016-08-22 DIAGNOSIS — Z90722 Acquired absence of ovaries, bilateral: Secondary | ICD-10-CM | POA: Diagnosis not present

## 2016-08-22 DIAGNOSIS — I10 Essential (primary) hypertension: Secondary | ICD-10-CM | POA: Diagnosis not present

## 2016-08-22 DIAGNOSIS — Z90710 Acquired absence of both cervix and uterus: Secondary | ICD-10-CM | POA: Diagnosis not present

## 2016-08-22 DIAGNOSIS — Z131 Encounter for screening for diabetes mellitus: Secondary | ICD-10-CM | POA: Diagnosis not present

## 2016-08-22 DIAGNOSIS — C50919 Malignant neoplasm of unspecified site of unspecified female breast: Secondary | ICD-10-CM | POA: Insufficient documentation

## 2016-08-22 DIAGNOSIS — M5442 Lumbago with sciatica, left side: Secondary | ICD-10-CM | POA: Diagnosis not present

## 2016-08-22 DIAGNOSIS — Z7189 Other specified counseling: Secondary | ICD-10-CM | POA: Insufficient documentation

## 2016-08-22 DIAGNOSIS — Z0000 Encounter for general adult medical examination without abnormal findings: Secondary | ICD-10-CM

## 2016-08-22 DIAGNOSIS — C50111 Malignant neoplasm of central portion of right female breast: Secondary | ICD-10-CM

## 2016-08-22 MED ORDER — METOPROLOL SUCCINATE ER 25 MG PO TB24: 25.0000 mg | ORAL_TABLET | Freq: Every day | ORAL | 12 refills | Status: DC

## 2016-08-22 MED ORDER — TRAMADOL HCL 50 MG PO TABS
50.0000 mg | ORAL_TABLET | Freq: Four times a day (QID) | ORAL | 5 refills | Status: DC | PRN
Start: 2016-08-22 — End: 2018-02-16

## 2016-08-22 MED ORDER — BENAZEPRIL HCL 40 MG PO TABS: 40.0000 mg | ORAL_TABLET | Freq: Every day | ORAL | 12 refills | Status: DC

## 2016-08-22 MED ORDER — SERTRALINE HCL 50 MG PO TABS: 50.0000 mg | ORAL_TABLET | Freq: Every day | ORAL | 12 refills | Status: DC

## 2016-08-22 MED ORDER — AMLODIPINE BESYLATE 5 MG PO TABS: 5.0000 mg | ORAL_TABLET | Freq: Every day | ORAL | 12 refills | Status: DC

## 2016-08-22 NOTE — Progress Notes (Signed)
BP 116/74   Pulse 82   Ht 5' 0.63" (1.54 m)   Wt 115 lb (52.2 kg)   SpO2 99%   BMI 22.00 kg/m    Subjective:    Patient ID: Casey Summers, female    DOB: 06-28-1945, 71 y.o.   MRN: [id number]  HPI: Casey Summers is a 71 y.o. female  Chief Complaint  Patient presents with  . Annual Exam  AWV metrics met.  Patient all in all doing well Blood pressure doing well with no complaints taking medications faithfully without problems Breast cancer on resolves not taking any antiestrogen agents because of intolerance and been doing well. Had follow-up this winter and is doing well.  Patient also with intermittent chronic back pain takes tramadol 50 mg half a tablet most days sometimes takes the other half a tablet. This controls her back pain adequately.  Relevant past medical, surgical, family and social history reviewed and updated as indicated. Interim medical history since our last visit reviewed. Allergies and medications reviewed and updated.  Review of Systems  Constitutional: Negative.   HENT: Negative.   Eyes: Negative.   Respiratory: Negative.   Cardiovascular: Negative.   Gastrointestinal: Negative.   Endocrine: Negative.   Genitourinary: Negative.   Musculoskeletal: Negative.   Skin: Negative.   Allergic/Immunologic: Negative.   Neurological: Negative.   Hematological: Negative.   Psychiatric/Behavioral: Negative.     Per HPI unless specifically indicated above     Objective:    BP 116/74   Pulse 82   Ht 5' 0.63" (1.54 m)   Wt 115 lb (52.2 kg)   SpO2 99%   BMI 22.00 kg/m   Wt Readings from Last 3 Encounters:  08/22/16 115 lb (52.2 kg)  03/11/16 117 lb (53.1 kg)  02/07/16 117 lb (53.1 kg)    Physical Exam  Constitutional: She is oriented to person, place, and time. She appears well-developed and well-nourished.  HENT:  Head: Normocephalic and atraumatic.  Right Ear: External ear normal.  Left Ear: External ear normal.  Nose: Nose  normal.  Mouth/Throat: Oropharynx is clear and moist.  Eyes: Pupils are equal, round, and reactive to light. Conjunctivae and EOM are normal.  Neck: Normal range of motion. Neck supple. Carotid bruit is not present.  Cardiovascular: Normal rate, regular rhythm and normal heart sounds.   No murmur heard. Pulmonary/Chest: Effort normal and breath sounds normal.  Breasts exam done in [facility] and reported as normal.  Abdominal: Soft. Bowel sounds are normal. There is no hepatosplenomegaly.  Musculoskeletal: Normal range of motion.  Neurological: She is alert and oriented to person, place, and time.  Skin: No rash noted.  Psychiatric: She has a normal mood and affect. Her behavior is normal. Judgment and thought content normal.    Results for orders placed or performed in visit on 39/53/20  Basic metabolic panel  Result Value Ref Range   Glucose 83 65 - 99 mg/dL   BUN 12 8 - 27 mg/dL   Creatinine, Ser 0.66 0.57 - 1.00 mg/dL   GFR calc non Af Amer 91 >59 mL/min/1.73   GFR calc Af Amer 105 >59 mL/min/1.73   BUN/Creatinine Ratio 18 12 - 28   Sodium 135 134 - 144 mmol/L   Potassium 4.7 3.5 - 5.2 mmol/L   Chloride 96 96 - 106 mmol/L   CO2 23 18 - 29 mmol/L   Calcium 9.4 8.7 - 10.3 mg/dL      Assessment & Plan:  Problem List Items Addressed This Visit      Cardiovascular and Mediastinum   Hypertension    The current medical regimen is effective;  continue present plan and medications.       Relevant Medications   benazepril (LOTENSIN) 40 MG tablet   metoprolol succinate (TOPROL-XL) 25 MG 24 hr tablet   amLODipine (NORVASC) 5 MG tablet   Other Relevant Orders   CBC with Differential/Platelet     Other   Low back pain    The current medical regimen is effective;  continue present plan and medications.       Relevant Medications   traMADol (ULTRAM) 50 MG tablet   Advanced care planning/counseling discussion    The current medical regimen is effective;  continue present  plan and medications.       Breast cancer ([place])   S/P [id number]    Other Visit Diagnoses    Screening for diabetes mellitus (DM)    -  Primary   Relevant Orders   Comprehensive metabolic panel   Urinalysis, Routine w reflex microscopic   Well adult exam       Screening cholesterol level       Relevant Orders   Lipid panel   Thyroid disorder screen       Relevant Orders   TSH       Follow up plan: Return in about 6 months (around 02/22/2017) for BMP.

## 2016-08-22 NOTE — Assessment & Plan Note (Signed)
The current medical regimen is effective;  continue present plan and medications.  

## 2016-08-23 LAB — COMPREHENSIVE METABOLIC PANEL
ALBUMIN: 4.5 g/dL (ref 3.6–4.8)
ALT: 41 IU/L — ABNORMAL HIGH (ref 0–32)
AST: 39 IU/L (ref 0–40)
Albumin/Globulin Ratio: 2 (ref 1.2–2.2)
Alkaline Phosphatase: 97 IU/L (ref 39–117)
BUN / CREAT RATIO: 14 (ref 12–28)
BUN: 10 mg/dL (ref 8–27)
Bilirubin Total: 0.8 mg/dL (ref 0.0–1.2)
CALCIUM: 9.4 mg/dL (ref 8.7–10.3)
CO2: 21 mmol/L (ref 20–29)
CREATININE: 0.71 mg/dL (ref 0.57–1.00)
Chloride: 103 mmol/L (ref 96–106)
GFR calc Af Amer: 101 mL/min/{1.73_m2} (ref >59)
GFR, EST NON AFRICAN AMERICAN: 88 mL/min/{1.73_m2} (ref >59)
GLOBULIN, TOTAL: 2.3 g/dL (ref 1.5–4.5)
Glucose: 96 mg/dL (ref 65–99)
Potassium: 4.5 mmol/L (ref 3.5–5.2)
SODIUM: 140 mmol/L (ref 134–144)
Total Protein: 6.8 g/dL (ref 6.0–8.5)

## 2016-08-23 LAB — CBC WITH DIFFERENTIAL/PLATELET
BASOS ABS: 0 10*3/uL (ref 0.0–0.2)
Basos: 0 %
EOS (ABSOLUTE): 0.1 10*3/uL (ref 0.0–0.4)
Eos: 1 %
Hematocrit: 44.4 % (ref 34.0–46.6)
Hemoglobin: 14.5 g/dL (ref 11.1–15.9)
Immature Grans (Abs): 0 10*3/uL (ref 0.0–0.1)
Immature Granulocytes: 0 %
LYMPHS ABS: 0.9 10*3/uL (ref 0.7–3.1)
LYMPHS: 18 %
MCH: 33 pg (ref 26.6–33.0)
MCHC: 32.7 g/dL (ref 31.5–35.7)
MCV: 101 fL — ABNORMAL HIGH (ref 79–97)
Monocytes Absolute: 0.4 10*3/uL (ref 0.1–0.9)
Monocytes: 8 %
NEUTROS ABS: 3.5 10*3/uL (ref 1.4–7.0)
Neutrophils: 73 %
PLATELETS: 203 10*3/uL (ref 150–379)
RBC: 4.4 x10E6/uL (ref 3.77–5.28)
RDW: 12.8 % (ref 12.3–15.4)
WBC: 4.8 10*3/uL (ref 3.4–10.8)

## 2016-08-23 LAB — LIPID PANEL
CHOLESTEROL TOTAL: 188 mg/dL (ref 100–199)
Chol/HDL Ratio: 2.5 ratio (ref 0.0–4.4)
HDL: 76 mg/dL (ref >39)
LDL Calculated: 96 mg/dL (ref 0–99)
TRIGLYCERIDES: 79 mg/dL (ref 0–149)
VLDL Cholesterol Cal: 16 mg/dL (ref 5–40)

## 2016-08-23 LAB — TSH: TSH: 0.637 u[IU]/mL (ref 0.450–4.500)

## 2016-08-26 ENCOUNTER — Encounter: Payer: Self-pay | Admitting: Family Medicine

## 2016-08-27 LAB — MICROSCOPIC EXAMINATION: BACTERIA UA: NONE SEEN

## 2016-08-27 LAB — URINALYSIS, ROUTINE W REFLEX MICROSCOPIC
Bilirubin, UA: NEGATIVE
GLUCOSE, UA: NEGATIVE
NITRITE UA: NEGATIVE
Protein, UA: NEGATIVE
RBC, UA: NEGATIVE
Specific Gravity, UA: 1.02 (ref 1.005–1.030)
Urobilinogen, Ur: 0.2 mg/dL (ref 0.2–1.0)
pH, UA: 6 (ref 5.0–7.5)

## 2016-11-01 DIAGNOSIS — H04201 Unspecified epiphora, right lacrimal gland: Secondary | ICD-10-CM | POA: Diagnosis not present

## 2016-11-05 ENCOUNTER — Ambulatory Visit (INDEPENDENT_AMBULATORY_CARE_PROVIDER_SITE_OTHER): Payer: Medicare Other | Admitting: Family Medicine

## 2016-11-05 ENCOUNTER — Encounter: Payer: Self-pay | Admitting: Family Medicine

## 2016-11-05 VITALS — BP 100/62 | HR 73 | Temp 98.4°F | Wt 116.6 lb

## 2016-11-05 DIAGNOSIS — R1310 Dysphagia, unspecified: Secondary | ICD-10-CM

## 2016-11-05 DIAGNOSIS — I10 Essential (primary) hypertension: Secondary | ICD-10-CM

## 2016-11-05 MED ORDER — PANTOPRAZOLE SODIUM 40 MG PO TBEC: 40.0000 mg | DELAYED_RELEASE_TABLET | Freq: Every day | ORAL | 3 refills | Status: DC

## 2016-11-05 NOTE — Progress Notes (Signed)
BP 100/62 (BP Location: Right Arm, Patient Position: Sitting, Cuff Size: Normal)   Pulse 73   Temp 98.4 F (36.9 C)   Wt 116 lb 9.6 oz (52.9 kg)   SpO2 100%   BMI 22.30 kg/m    Subjective:    Patient ID: Casey Summers, female    DOB: 05-23-1945, 71 y.o.   MRN: [id number]  HPI: JEFF MCCALLUM is a 71 y.o. female  Chief Complaint  Patient presents with  . Dizziness    x's 2 months.   . Dysphagia    x's 2 months. Hard to swallow, eating smaller to swallow better.    Patient presents with dizziness upon standing x 2 months. Denies N/V, and episodes fade after standing upright for a few seconds. Denies CP, palpitations, syncope. Does note that BPs have been on the low side. Currently taking benazepril, amlodipine, and metoprolol. Drinks a fair amount of water.   Also notes dysphagia to larger solids x 2 months. Feels like issue is right at her throat area, not lower down. Does not feel like food gets caught, just like the back of her throat is inflamed. No known hx of reflux issues, esophageal problems, or dysphagia sxs.   Past Medical History:  Diagnosis Date  . Allergy   . Cancer (HCC)    breast  . Chronic mid back pain   . Depression   . Hypertension   . Osteoporosis    Social History   Social History  . Marital status: Divorced    Spouse name: N/A  . Number of children: N/A  . Years of education: N/A   Occupational History  . Not on file.   Social History Main Topics  . Smoking status: Former Smoker    Types: Cigarettes    Quit date: 08/28/2008  . Smokeless tobacco: Never Used  . Alcohol use Yes  . Drug use: No  . Sexual activity: Not on file   Other Topics Concern  . Not on file   Social History Narrative  . No narrative on file    Relevant past medical, surgical, family and social history reviewed and updated as indicated. Interim medical history since our last visit reviewed. Allergies and medications reviewed and updated.  Review of  Systems  Constitutional: Negative.   HENT: Positive for trouble swallowing.   Respiratory: Negative.   Cardiovascular: Negative.   Gastrointestinal: Negative.   Musculoskeletal: Negative.   Neurological: Positive for dizziness.  Psychiatric/Behavioral: Negative.    Per HPI unless specifically indicated above     Objective:    BP 100/62 (BP Location: Right Arm, Patient Position: Sitting, Cuff Size: Normal)   Pulse 73   Temp 98.4 F (36.9 C)   Wt 116 lb 9.6 oz (52.9 kg)   SpO2 100%   BMI 22.30 kg/m   Wt Readings from Last 3 Encounters:  11/05/16 116 lb 9.6 oz (52.9 kg)  08/22/16 115 lb (52.2 kg)  03/11/16 117 lb (53.1 kg)    Physical Exam  Constitutional: She is oriented to person, place, and time. She appears well-developed and well-nourished. No distress.  HENT:  Head: Atraumatic.  Nose: Nose normal.  Mouth/Throat: Oropharynx is clear and moist. No oropharyngeal exudate.  Eyes: Conjunctivae are normal. No scleral icterus.  Neck: Normal range of motion. Neck supple. No thyromegaly present.  Cardiovascular: Normal rate and normal heart sounds.   Pulmonary/Chest: Effort normal and breath sounds normal. No respiratory distress.  Musculoskeletal: Normal range of motion.  Lymphadenopathy:  She has no cervical adenopathy.  Neurological: She is alert and oriented to person, place, and time.  Skin: Skin is warm and dry.  Psychiatric: She has a normal mood and affect. Her behavior is normal.  Nursing note and vitals reviewed.     Assessment & Plan:   Problem List Items Addressed This Visit      Cardiovascular and Mediastinum   Hypertension - Primary    Orthostatics not significantly different, but suspect hypotension is causing her positional dizziness. Push fluids, d/c amlodipine. Monitor BPs at home and watch for persistently elevated readings with d/c of medication. F/u if no improvement       Other Visit Diagnoses    Dysphagia, unspecified type       Discussed  common causes, including reflux. Will do trial of PPI and assess for benefit. If no improvement, will refer to ENT for further eval       Follow up plan: Return in about 4 weeks (around 12/03/2016) for BP and dysphagia f/u.

## 2016-11-05 NOTE — Patient Instructions (Signed)
Stop amlodipine, continue other blood pressure medicines. Monitor blood pressures regularly, let me know if they are persistently abnormal (either high or low).

## 2016-11-07 NOTE — Assessment & Plan Note (Signed)
Orthostatics not significantly different, but suspect hypotension is causing her positional dizziness. Push fluids, d/c amlodipine. Monitor BPs at home and watch for persistently elevated readings with d/c of medication. F/u if no improvement

## 2017-02-14 ENCOUNTER — Telehealth: Payer: Self-pay | Admitting: Family Medicine

## 2017-02-14 DIAGNOSIS — Z1239 Encounter for other screening for malignant neoplasm of breast: Secondary | ICD-10-CM

## 2017-02-14 NOTE — Telephone Encounter (Signed)
Copied from [place] [id number]. Topic: General - Other >> Feb 14, 2017  3:01 PM Cecelia Byars, NT wrote: Reason for CRM: Patient received a letter from [facility] , stating that  she need discuss with her provider  having a mammogram done there , does she need a referral . Please advise  [phone],

## 2017-02-17 NOTE — Telephone Encounter (Signed)
Please sign order if appropriate.

## 2017-02-17 NOTE — Telephone Encounter (Signed)
OK 

## 2017-02-26 ENCOUNTER — Ambulatory Visit: Payer: Medicare Other | Admitting: Family Medicine

## 2017-05-29 ENCOUNTER — Ambulatory Visit (INDEPENDENT_AMBULATORY_CARE_PROVIDER_SITE_OTHER): Payer: Medicare Other | Admitting: Family Medicine

## 2017-05-29 ENCOUNTER — Encounter: Payer: Self-pay | Admitting: Family Medicine

## 2017-05-29 VITALS — BP 169/94 | HR 65 | Ht 61.0 in | Wt 114.3 lb

## 2017-05-29 DIAGNOSIS — C50111 Malignant neoplasm of central portion of right female breast: Secondary | ICD-10-CM

## 2017-05-29 DIAGNOSIS — Z1239 Encounter for other screening for malignant neoplasm of breast: Secondary | ICD-10-CM

## 2017-05-29 DIAGNOSIS — M5442 Lumbago with sciatica, left side: Secondary | ICD-10-CM

## 2017-05-29 DIAGNOSIS — G8929 Other chronic pain: Secondary | ICD-10-CM

## 2017-05-29 DIAGNOSIS — F32A Depression, unspecified: Secondary | ICD-10-CM | POA: Insufficient documentation

## 2017-05-29 DIAGNOSIS — F3342 Major depressive disorder, recurrent, in full remission: Secondary | ICD-10-CM | POA: Diagnosis not present

## 2017-05-29 DIAGNOSIS — Z170 Estrogen receptor positive status [ER+]: Secondary | ICD-10-CM | POA: Diagnosis not present

## 2017-05-29 DIAGNOSIS — I10 Essential (primary) hypertension: Secondary | ICD-10-CM

## 2017-05-29 DIAGNOSIS — Z1231 Encounter for screening mammogram for malignant neoplasm of breast: Secondary | ICD-10-CM | POA: Diagnosis not present

## 2017-05-29 DIAGNOSIS — F329 Major depressive disorder, single episode, unspecified: Secondary | ICD-10-CM | POA: Insufficient documentation

## 2017-05-29 NOTE — Progress Notes (Signed)
BP (!) 169/94   Pulse 65   Ht 5\' 1"  (1.549 m)   Wt 114 lb 4.8 oz (51.8 kg)   SpO2 98%   BMI 21.60 kg/m    Subjective:    Patient ID: Casey Summers, female    DOB: 1945-11-11, 72 y.o.   MRN: [id number]  HPI: Casey Summers is a 72 y.o. female  Chief Complaint  Patient presents with  . Follow-up  . Hypertension   Patient follow-up blood pressure doing well on home blood pressure monitoring no complaints from Benzapril and metoprolol which she takes faithfully ordinarily.  But did not take today. Nerves doing okay with sertraline 50 Reflux stable. Has been taking tramadol half a tablet usually once a day takes in the morning which works fine for back pain.  Relevant past medical, surgical, family and social history reviewed and updated as indicated. Interim medical history since our last visit reviewed. Allergies and medications reviewed and updated.  Review of Systems  Constitutional: Negative.   Respiratory: Negative.   Cardiovascular: Negative.     Per HPI unless specifically indicated above     Objective:    BP (!) 169/94   Pulse 65   Ht 5\' 1"  (1.549 m)   Wt 114 lb 4.8 oz (51.8 kg)   SpO2 98%   BMI 21.60 kg/m   Wt Readings from Last 3 Encounters:  05/29/17 114 lb 4.8 oz (51.8 kg)  11/05/16 116 lb 9.6 oz (52.9 kg)  08/22/16 115 lb (52.2 kg)    Physical Exam  Constitutional: She is oriented to person, place, and time. She appears well-developed and well-nourished.  HENT:  Head: Normocephalic and atraumatic.  Eyes: Conjunctivae and EOM are normal.  Neck: Normal range of motion.  Cardiovascular: Normal rate, regular rhythm and normal heart sounds.  Pulmonary/Chest: Effort normal and breath sounds normal.  Musculoskeletal: Normal range of motion.  Neurological: She is alert and oriented to person, place, and time.  Skin: No erythema.  Several moles patient worried about are seborrheic keratosis will observe  Psychiatric: She has a normal mood and  affect. Her behavior is normal. Judgment and thought content normal.    Results for orders placed or performed in visit on 08/22/16  Microscopic Examination  Result Value Ref Range   WBC, UA 0-5 0 - 5 /hpf   RBC, UA 0-2 0 - 2 /hpf   Epithelial Cells (non renal) 0-10 0 - 10 /hpf   Renal Epithel, UA 0-10 (A) None seen /hpf   Bacteria, UA None seen None seen/Few  CBC with Differential/Platelet  Result Value Ref Range   WBC 4.8 3.4 - 10.8 x10E3/uL   RBC 4.40 3.77 - 5.28 x10E6/uL   Hemoglobin 14.5 11.1 - 15.9 g/dL   Hematocrit 44.4 34.0 - 46.6 %   MCV 101 (H) 79 - 97 fL   MCH 33.0 26.6 - 33.0 pg   MCHC 32.7 31.5 - 35.7 g/dL   RDW 12.8 12.3 - 15.4 %   Platelets 203 150 - 379 x10E3/uL   Neutrophils 73 Not Estab. %   Lymphs 18 Not Estab. %   Monocytes 8 Not Estab. %   Eos 1 Not Estab. %   Basos 0 Not Estab. %   Neutrophils Absolute 3.5 1.4 - 7.0 x10E3/uL   Lymphocytes Absolute 0.9 0.7 - 3.1 x10E3/uL   Monocytes Absolute 0.4 0.1 - 0.9 x10E3/uL   EOS (ABSOLUTE) 0.1 0.0 - 0.4 x10E3/uL   Basophils Absolute 0.0 0.0 -  0.2 x10E3/uL   Immature Granulocytes 0 Not Estab. %   Immature Grans (Abs) 0.0 0.0 - 0.1 x10E3/uL  Lipid panel  Result Value Ref Range   Cholesterol, Total 188 100 - 199 mg/dL   Triglycerides 79 0 - 149 mg/dL   HDL 76 >39 mg/dL   VLDL Cholesterol Cal 16 5 - 40 mg/dL   LDL Calculated 96 0 - 99 mg/dL   Chol/HDL Ratio 2.5 0.0 - 4.4 ratio  Comprehensive metabolic panel  Result Value Ref Range   Glucose 96 65 - 99 mg/dL   BUN 10 8 - 27 mg/dL   Creatinine, Ser 0.71 0.57 - 1.00 mg/dL   GFR calc non Af Amer 88 >59 mL/min/1.73   GFR calc Af Amer 101 >59 mL/min/1.73   BUN/Creatinine Ratio 14 12 - 28   Sodium 140 134 - 144 mmol/L   Potassium 4.5 3.5 - 5.2 mmol/L   Chloride 103 96 - 106 mmol/L   CO2 21 20 - 29 mmol/L   Calcium 9.4 8.7 - 10.3 mg/dL   Total Protein 6.8 6.0 - 8.5 g/dL   Albumin 4.5 3.6 - 4.8 g/dL   Globulin, Total 2.3 1.5 - 4.5 g/dL   Albumin/Globulin Ratio  2.0 1.2 - 2.2   Bilirubin Total 0.8 0.0 - 1.2 mg/dL   Alkaline Phosphatase 97 39 - 117 IU/L   AST 39 0 - 40 IU/L   ALT 41 (H) 0 - 32 IU/L  TSH  Result Value Ref Range   TSH 0.637 0.450 - 4.500 uIU/mL  Urinalysis, Routine w reflex microscopic  Result Value Ref Range   Specific Gravity, UA 1.020 1.005 - 1.030   pH, UA 6.0 5.0 - 7.5   Color, UA Yellow Yellow   Appearance Ur Cloudy (A) Clear   Leukocytes, UA 1+ (A) Negative   Protein, UA Negative Negative/Trace   Glucose, UA Negative Negative   Ketones, UA Trace (A) Negative   RBC, UA Negative Negative   Bilirubin, UA Negative Negative   Urobilinogen, Ur 0.2 0.2 - 1.0 mg/dL   Nitrite, UA Negative Negative   Microscopic Examination See below:       Assessment & Plan:   Problem List Items Addressed This Visit      Cardiovascular and Mediastinum   Hypertension - Primary    The current medical regimen is effective;  continue present plan and medications.       Relevant Orders   Basic metabolic panel     Other   Low back pain    The current medical regimen is effective;  continue present plan and medications.       Breast cancer ([place])   Relevant Orders   HM MAMMOGRAPHY   Depression    The current medical regimen is effective;  continue present plan and medications.        Other Visit Diagnoses    Breast cancer screening           Follow up plan: Return in about 3 months (around 08/29/2017) for Physical Exam.

## 2017-05-29 NOTE — Assessment & Plan Note (Signed)
The current medical regimen is effective;  continue present plan and medications.  

## 2017-05-30 LAB — BASIC METABOLIC PANEL
BUN/Creatinine Ratio: 14 (ref 12–28)
BUN: 10 mg/dL (ref 8–27)
CHLORIDE: 100 mmol/L (ref 96–106)
CO2: 26 mmol/L (ref 20–29)
CREATININE: 0.71 mg/dL (ref 0.57–1.00)
Calcium: 9.8 mg/dL (ref 8.7–10.3)
GFR calc Af Amer: 100 mL/min/{1.73_m2} (ref >59)
GFR calc non Af Amer: 87 mL/min/{1.73_m2} (ref >59)
GLUCOSE: 88 mg/dL (ref 65–99)
Potassium: 4.4 mmol/L (ref 3.5–5.2)
SODIUM: 139 mmol/L (ref 134–144)

## 2017-06-02 ENCOUNTER — Encounter: Payer: Self-pay | Admitting: Family Medicine

## 2017-06-09 ENCOUNTER — Ambulatory Visit
Admission: EM | Admit: 2017-06-09 | Discharge: 2017-06-09 | Disposition: A | Discharge status: Home / Self Care | Payer: Medicare Other | Attending: Family Medicine | Admitting: Family Medicine

## 2017-06-09 ENCOUNTER — Other Ambulatory Visit: Payer: Self-pay

## 2017-06-09 ENCOUNTER — Encounter: Payer: Self-pay | Admitting: Emergency Medicine

## 2017-06-09 DIAGNOSIS — J069 Acute upper respiratory infection, unspecified: Secondary | ICD-10-CM | POA: Diagnosis not present

## 2017-06-09 DIAGNOSIS — H9212 Otorrhea, left ear: Secondary | ICD-10-CM

## 2017-06-09 DIAGNOSIS — H60502 Unspecified acute noninfective otitis externa, left ear: Secondary | ICD-10-CM

## 2017-06-09 DIAGNOSIS — H6690 Otitis media, unspecified, unspecified ear: Secondary | ICD-10-CM

## 2017-06-09 MED ORDER — OFLOXACIN 0.3 % OT SOLN: 5.0000 [drp] | Freq: Two times a day (BID) | OTIC | 0 refills | Status: AC

## 2017-06-09 MED ORDER — AMOXICILLIN 875 MG PO TABS: 875.0000 mg | ORAL_TABLET | Freq: Two times a day (BID) | ORAL | 0 refills | Status: DC

## 2017-06-09 NOTE — ED Triage Notes (Signed)
Patient in today c/o left ear pain and drainage, cough and sore throat x 3 days. Patient has had temp of 99.5. Patient has a history of a hole in her left ear drum and has had surgery on it. Patient has not tried any OTC medications.

## 2017-06-09 NOTE — Discharge Instructions (Addendum)
Take medication as prescribed. Rest. Drink plenty of fluids.  ° °Follow up with your primary care physician this week as needed. Return to Urgent care for new or worsening concerns.  ° °

## 2017-06-09 NOTE — ED Provider Notes (Signed)
[facility] ____________________________________________  Time seen: Approximately 10:26 AM  I have reviewed the triage vital signs and the nursing notes.   HISTORY  Chief Complaint Otalgia (left)   HPI Casey Summers is a 72 y.o. female presenting for evaluation of nasal congestion, nasal drainage, cough present for the last 3 to 4 days.  Patient reports over the last 2 to 3 days she has been having intermittent left ear discomfort, left ear drainage and muffled hearing.  Patient reports otherwise feels well.  No known fevers.  Has not been taking any over-the-counter medications for the same complaints.  Reports approximately 12 years ago she did have surgical repair of left tympanic membrane, per patient healed well.  Denies any trauma.  Reports otherwise feels well.  Denies other aggravating or alleviating factors. Denies chest pain, shortness of breath,  extremity pain, extremity swelling or rash. Denies recent sickness. Denies recent antibiotic use.   Guadalupe Maple, MD: PCP   Past Medical History:  Diagnosis Date  . Allergy   . Cancer (HCC)    breast  . Chronic mid back pain   . Depression   . Hypertension   . Osteoporosis     Patient Active Problem List   Diagnosis Date Noted  . Depression 05/29/2017  . Advanced care planning/counseling discussion 08/22/2016  . Breast cancer ([place]) 08/22/2016  . S/P TAH-BSO 08/22/2016  . Low back pain 11/28/2014  . Hypertension     Past Surgical History:  Procedure Laterality Date  . ABDOMINAL HYSTERECTOMY    . BREAST LUMPECTOMY  2010     No current facility-administered medications for this encounter.   Current Outpatient Medications:  .  aspirin 81 MG tablet, Take 81 mg by mouth., Disp: , Rfl:  .  benazepril (LOTENSIN) 40 MG tablet, Take 1 tablet (40 mg total) by mouth daily., Disp: 30 tablet, Rfl: 12 .  Cholecalciferol (VITAMIN D-1000 MAX ST) 1000 UNITS tablet, Take by mouth., Disp: , Rfl:  .   metoprolol succinate (TOPROL-XL) 25 MG 24 hr tablet, Take 1 tablet (25 mg total) by mouth daily., Disp: 30 tablet, Rfl: 12 .  sertraline (ZOLOFT) 50 MG tablet, Take 1 tablet (50 mg total) by mouth daily., Disp: 30 tablet, Rfl: 12 .  traMADol (ULTRAM) 50 MG tablet, Take 1-2 tablets (50-100 mg total) by mouth every 6 (six) hours as needed., Disp: 30 tablet, Rfl: 5 .  amoxicillin (AMOXIL) 875 MG tablet, Take 1 tablet (875 mg total) by mouth 2 (two) times daily., Disp: 20 tablet, Rfl: 0 .  Multiple Vitamin (MULTIVITAMIN) tablet, Take 1 tablet by mouth daily., Disp: , Rfl:  .  ofloxacin (FLOXIN) 0.3 % OTIC solution, Place 5 drops into the left ear 2 (two) times daily for 7 days., Disp: 5 mL, Rfl: 0  Allergies Patient has no known allergies.  Family History  Problem Relation Age of Onset  . Congestive Heart Failure Mother   . Cancer Sister        breast    Social History Social History   Tobacco Use  . Smoking status: Former Smoker    Types: Cigarettes    Last attempt to quit: 08/28/2008    Years since quitting: 8.7  . Smokeless tobacco: Never Used  Substance Use Topics  . Alcohol use: Yes    Alcohol/week: 8.4 oz    Types: 14 Glasses of wine per week  . Drug use: No    Review of Systems Constitutional: No fever Eyes: No visual  changes. ENT: Some sore throat. AS above.  Cardiovascular: Denies chest pain. Respiratory: Denies shortness of breath. Gastrointestinal: No abdominal pain. Musculoskeletal: Negative for back pain. Skin: Negative for rash.  ____________________________________________   PHYSICAL EXAM:  VITAL SIGNS: ED Triage Vitals [06/09/17 0816]  Enc Vitals Group     BP (!) 144/93     Pulse Rate 80     Resp 16     Temp 99.2 F (37.3 C)     Temp Source Oral     SpO2 97 %     Weight 114 lb (51.7 kg)     Height 5\' 1"  (1.549 m)     Head Circumference      Peak Flow      Pain Score 6     Pain Loc      Pain Edu?      Excl. in [place]?     Constitutional: Alert  and oriented. Well appearing and in no acute distress. Eyes: Conjunctivae are normal.  Head: Atraumatic. No sinus tenderness to palpation. No swelling. No erythema.  Ears: Right: Nontender, normal canal, no erythema, normal TM.  Left: Mild tenderness with auricle movement, canal with mild edema and erythema with purulent drainage present, TM appears erythematous and unable to visualize full TM, no TM perforation visible.  No surrounding tenderness, swelling or erythema bilaterally.  Nose:Nasal congestion with clear rhinorrhea  Mouth/Throat: Mucous membranes are moist. Mild pharyngeal erythema. No tonsillar swelling or exudate.  Neck: No stridor.  No cervical spine tenderness to palpation. Hematological/Lymphatic/Immunilogical: No cervical lymphadenopathy. Cardiovascular: Normal rate, regular rhythm. Grossly normal heart sounds.  Good peripheral circulation. Respiratory: Normal respiratory effort.  No retractions. No wheezes, rales or rhonchi. Good air movement.  Musculoskeletal: Ambulatory with steady gait.  Neurologic:  Normal speech and language. No gait instability. Skin:  Skin appears warm, dry and intact. No rash noted. Psychiatric: Mood and affect are normal. Speech and behavior are normal.   ___________________________________________   LABS (all labs ordered are listed, but only abnormal results are displayed)  Labs Reviewed - No data to display ____________________________________________  RADIOLOGY  No results found. ____________________________________________   PROCEDURES Procedures    INITIAL IMPRESSION / ASSESSMENT AND PLAN / ED COURSE  Pertinent labs & imaging results that were available during my care of the patient were reviewed by me and considered in my medical decision making (see chart for details).  Well-appearing patient.  No acute distress.  Suspect recent viral upper respiratory infection.  Patient with left ear otorrhea and drainage, drainage present in  canal and patient very tender, unable to fully visualize TM, concern for rupture but is unable to confirm.  Will treat with oral amoxicillin as well as ofloxacin topical.  Encourage rest, fluids, supportive care.  Follow-up with primary care or ENT in one week.Discussed indication, risks and benefits of medications with patient.  Discussed follow up with Primary care physician this week. Discussed follow up and return parameters including no resolution or any worsening concerns. Patient verbalized understanding and agreed to plan.   ____________________________________________   FINAL CLINICAL IMPRESSION(S) / ED DIAGNOSES  Final diagnoses:  Acute otitis externa of left ear, unspecified type  Acute otitis media, unspecified otitis media type  Otorrhea, left  Upper respiratory tract infection, unspecified type     ED Discharge Orders        Ordered    amoxicillin (AMOXIL) 875 MG tablet  2 times daily     06/09/17 0840    ofloxacin (FLOXIN) 0.3 %  OTIC solution  2 times daily     06/09/17 0840       Note: This dictation was prepared with Dragon dictation along with smaller phrase technology. Any transcriptional errors that result from this process are unintentional.         Marylene Land, NP 06/09/17 1037

## 2017-06-18 ENCOUNTER — Ambulatory Visit (INDEPENDENT_AMBULATORY_CARE_PROVIDER_SITE_OTHER): Payer: Medicare Other | Admitting: Unknown Physician Specialty

## 2017-06-18 ENCOUNTER — Encounter: Payer: Self-pay | Admitting: Unknown Physician Specialty

## 2017-06-18 ENCOUNTER — Telehealth: Payer: Self-pay | Admitting: Family Medicine

## 2017-06-18 VITALS — BP 126/85 | HR 73 | Temp 98.0°F | Ht 61.0 in | Wt 113.8 lb

## 2017-06-18 DIAGNOSIS — H6122 Impacted cerumen, left ear: Secondary | ICD-10-CM

## 2017-06-18 NOTE — Progress Notes (Signed)
BP 126/85   Pulse 73   Temp 98 F (36.7 C) (Oral)   Ht 5\' 1"  (1.549 m)   Wt 113 lb 12.8 oz (51.6 kg)   SpO2 96%   BMI 21.50 kg/m    Subjective:    Patient ID: Casey Summers, female    DOB: [birth year], 72 y.o.   MRN: [id number]  HPI: Casey Summers is a 72 y.o. female  Chief Complaint  Patient presents with  . URI    pt states she is better, has one tablet of amoxicillin left, still having a little bit of ear pain   Sinusitis  Chronicity: Presented to Urgent care 5/13 for OM.  She is better but continuing with ear pain. The problem has been gradually improving since onset. There has been no fever. The pain is mild. Associated symptoms include ear pain and sinus pressure. Pertinent negatives include no chills, congestion, coughing, diaphoresis, headaches, hoarse voice, neck pain, shortness of breath, sneezing, sore throat or swollen glands. (Can't hear out of left ear.  ) Past treatments include nothing. The treatment provided no relief.   Relevant past medical, surgical, family and social history reviewed and updated as indicated. Interim medical history since our last visit reviewed. Allergies and medications reviewed and updated.  Review of Systems  Constitutional: Negative for chills and diaphoresis.  HENT: Positive for ear pain and sinus pressure. Negative for congestion, hoarse voice, sneezing and sore throat.   Respiratory: Negative for cough and shortness of breath.   Musculoskeletal: Negative for neck pain.  Neurological: Negative for headaches.    Per HPI unless specifically indicated above     Objective:    BP 126/85   Pulse 73   Temp 98 F (36.7 C) (Oral)   Ht 5\' 1"  (1.549 m)   Wt 113 lb 12.8 oz (51.6 kg)   SpO2 96%   BMI 21.50 kg/m   Wt Readings from Last 3 Encounters:  06/18/17 113 lb 12.8 oz (51.6 kg)  06/09/17 114 lb (51.7 kg)  05/29/17 114 lb 4.8 oz (51.8 kg)    Physical Exam  Constitutional: She is oriented to person, place, and time.  She appears well-developed and well-nourished. No distress.  HENT:  Head: Normocephalic and atraumatic.  Unable to see left ear drum due to wax buildup.  Unable to irrigate today.    Eyes: Conjunctivae and lids are normal. Right eye exhibits no discharge. Left eye exhibits no discharge. No scleral icterus.  Neck: Normal range of motion. Neck supple. No JVD present. Carotid bruit is not present.  Cardiovascular: Normal rate, regular rhythm and normal heart sounds.  Pulmonary/Chest: Effort normal and breath sounds normal.  Abdominal: Normal appearance. There is no splenomegaly or hepatomegaly.  Musculoskeletal: Normal range of motion.  Neurological: She is alert and oriented to person, place, and time.  Skin: Skin is warm, dry and intact. No rash noted. No pallor.  Psychiatric: She has a normal mood and affect. Her behavior is normal. Judgment and thought content normal.    Results for orders placed or performed in visit on 63/78/58  Basic metabolic panel  Result Value Ref Range   Glucose 88 65 - 99 mg/dL   BUN 10 8 - 27 mg/dL   Creatinine, Ser 0.71 0.57 - 1.00 mg/dL   GFR calc non Af Amer 87 >59 mL/min/1.73   GFR calc Af Amer 100 >59 mL/min/1.73   BUN/Creatinine Ratio 14 12 - 28   Sodium 139 134 - 144 mmol/L  Potassium 4.4 3.5 - 5.2 mmol/L   Chloride 100 96 - 106 mmol/L   CO2 26 20 - 29 mmol/L   Calcium 9.8 8.7 - 10.3 mg/dL      Assessment & Plan:   Problem List Items Addressed This Visit    None    Visit Diagnoses    Impacted cerumen of left ear    -  Primary   Unable to irrigate today.  Increasing irritation with attempts.  Use OTC baby oil or debrox to soften wax.  Recheck next week       Follow up plan: Return in about 1 week (around 06/25/2017).

## 2017-06-30 ENCOUNTER — Ambulatory Visit (INDEPENDENT_AMBULATORY_CARE_PROVIDER_SITE_OTHER): Payer: Medicare Other

## 2017-06-30 ENCOUNTER — Ambulatory Visit (INDEPENDENT_AMBULATORY_CARE_PROVIDER_SITE_OTHER): Payer: Medicare Other | Admitting: Unknown Physician Specialty

## 2017-06-30 ENCOUNTER — Encounter: Payer: Self-pay | Admitting: Unknown Physician Specialty

## 2017-06-30 VITALS — BP 132/83 | HR 67 | Temp 97.6°F | Ht 60.0 in | Wt 116.7 lb

## 2017-06-30 VITALS — BP 142/82 | HR 75 | Temp 97.6°F | Resp 16 | Ht 60.0 in | Wt 116.7 lb

## 2017-06-30 DIAGNOSIS — Z23 Encounter for immunization: Secondary | ICD-10-CM | POA: Diagnosis not present

## 2017-06-30 DIAGNOSIS — H9202 Otalgia, left ear: Secondary | ICD-10-CM

## 2017-06-30 DIAGNOSIS — Z0000 Encounter for general adult medical examination without abnormal findings: Secondary | ICD-10-CM

## 2017-06-30 DIAGNOSIS — H6122 Impacted cerumen, left ear: Secondary | ICD-10-CM

## 2017-06-30 NOTE — Progress Notes (Signed)
BP 132/83 (BP Location: Right Arm, Cuff Size: Normal)   Pulse 67   Temp 97.6 F (36.4 C) (Temporal)   Ht 5' (1.524 m)   Wt 116 lb 11.2 oz (52.9 kg)   SpO2 98%   BMI 22.79 kg/m    Subjective:    Patient ID: Casey Summers, female    DOB: Jun 09, 1945, 72 y.o.   MRN: [id number]  HPI: Casey Summers is a 72 y.o. female  Chief Complaint  Patient presents with  . Ear Pain    3/10   Pt is here to f/u on her ear pain.  She used ear drops, debrox and baby oil.  Still bothering her. Attempted irrigation and unsuccessful.  History of ear surgery. States she hears air in her ear along with tingling and itching  Relevant past medical, surgical, family and social history reviewed and updated as indicated. Interim medical history since our last visit reviewed. Allergies and medications reviewed and updated.  Review of Systems  Per HPI unless specifically indicated above     Objective:    BP 132/83 (BP Location: Right Arm, Cuff Size: Normal)   Pulse 67   Temp 97.6 F (36.4 C) (Temporal)   Ht 5' (1.524 m)   Wt 116 lb 11.2 oz (52.9 kg)   SpO2 98%   BMI 22.79 kg/m   Wt Readings from Last 3 Encounters:  06/30/17 116 lb 11.2 oz (52.9 kg)  06/30/17 116 lb 11.2 oz (52.9 kg)  06/18/17 113 lb 12.8 oz (51.6 kg)    Physical Exam  Constitutional: She is oriented to person, place, and time. She appears well-developed and well-nourished. No distress.  HENT:  Head: Normocephalic and atraumatic.  Unsuccessful irrigation but able to loosen wax.  Stopped due to pain.    Eyes: Conjunctivae and lids are normal. Right eye exhibits no discharge. Left eye exhibits no discharge. No scleral icterus.  Cardiovascular: Normal rate.  Pulmonary/Chest: Effort normal.  Abdominal: Normal appearance. There is no splenomegaly or hepatomegaly.  Musculoskeletal: Normal range of motion.  Neurological: She is alert and oriented to person, place, and time.  Skin: Skin is intact. No rash noted. No pallor.   Psychiatric: She has a normal mood and affect. Her behavior is normal. Judgment and thought content normal.    Results for orders placed or performed in visit on 25/95/63  Basic metabolic panel  Result Value Ref Range   Glucose 88 65 - 99 mg/dL   BUN 10 8 - 27 mg/dL   Creatinine, Ser 0.71 0.57 - 1.00 mg/dL   GFR calc non Af Amer 87 >59 mL/min/1.73   GFR calc Af Amer 100 >59 mL/min/1.73   BUN/Creatinine Ratio 14 12 - 28   Sodium 139 134 - 144 mmol/L   Potassium 4.4 3.5 - 5.2 mmol/L   Chloride 100 96 - 106 mmol/L   CO2 26 20 - 29 mmol/L   Calcium 9.8 8.7 - 10.3 mg/dL      Assessment & Plan:   Problem List Items Addressed This Visit    None    Visit Diagnoses    Left ear pain    -  Primary   Refer to ENT for further evaluation.  i can't completely observe her ear.     Relevant Orders   Ambulatory referral to ENT   Impacted cerumen of left ear       Unable to clear with our materials.  Pt ed on use of baby oil for further  softening of ear wax       Follow up plan: Return if symptoms worsen or fail to improve.

## 2017-06-30 NOTE — Progress Notes (Signed)
Subjective:   Casey Summers is a 72 y.o. female who presents for Medicare Annual (Subsequent) preventive examination.  Review of Systems:   Cardiac Risk Factors include: hypertension;advanced age (>63men, >9 women);dyslipidemia;smoking/ tobacco exposure     Objective:     Vitals: BP (!) 142/82 (BP Location: Left Arm, Patient Position: Sitting)   Pulse 75   Temp 97.6 F (36.4 C) (Temporal)   Resp 16   Ht 5' (1.524 m)   Wt 116 lb 11.2 oz (52.9 kg)   SpO2 98%   BMI 22.79 kg/m   Body mass index is 22.79 kg/m.  Advanced Directives 06/30/2017  Does Patient Have a Medical Advance Directive? No  Would patient like information on creating a medical advance directive? Yes (MAU/Ambulatory/Procedural Areas - Information given)    Tobacco Social History   Tobacco Use  Smoking Status Former Smoker  . Types: Cigarettes  . Last attempt to quit: 08/28/2008  . Years since quitting: 8.8  Smokeless Tobacco Never Used     Counseling given: Not Answered   Clinical Intake:  Pre-visit preparation completed: Yes  Pain : 0-10 Pain Score: 3  Pain Type: Acute pain Pain Location: Ear Pain Orientation: Left Pain Descriptors / Indicators: Aching Pain Onset: 1 to 4 weeks ago Pain Frequency: Intermittent     Nutritional Status: BMI of 19-24  Normal Nutritional Risks: None Diabetes: No  How often do you need to have someone help you when you read instructions, pamphlets, or other written materials from your doctor or pharmacy?: 1 - Never What is the last grade level you completed in school?: associates degree   Interpreter Needed?: No  Information entered by :: Tiffany Hill,LPN   Past Medical History:  Diagnosis Date  . Allergy   . Cancer (HCC)    breast  . Chronic mid back pain   . Depression   . Hypertension   . Osteoporosis    Past Surgical History:  Procedure Laterality Date  . ABDOMINAL HYSTERECTOMY    . BREAST LUMPECTOMY  2010   Family History  Problem  Relation Age of Onset  . Congestive Heart Failure Mother   . Cancer Sister        breast   Social History   Socioeconomic History  . Marital status: Divorced    Spouse name: Not on file  . Number of children: Not on file  . Years of education: Not on file  . Highest education level: Associate degree: academic program  Occupational History  . Not on file  Social Needs  . Financial resource strain: Not hard at all  . Food insecurity:    Worry: Never true    Inability: Never true  . Transportation needs:    Medical: No    Non-medical: No  Tobacco Use  . Smoking status: Former Smoker    Types: Cigarettes    Last attempt to quit: 08/28/2008    Years since quitting: 8.8  . Smokeless tobacco: Never Used  Substance and Sexual Activity  . Alcohol use: Yes    Alcohol/week: 8.4 oz    Types: 14 Glasses of wine per week  . Drug use: No  . Sexual activity: Not on file  Lifestyle  . Physical activity:    Days per week: 0 days    Minutes per session: 0 min  . Stress: Not at all  Relationships  . Social connections:    Talks on phone: More than three times a week    Gets together:  More than three times a week    Attends religious service: 1 to 4 times per year    Active member of club or organization: No    Attends meetings of clubs or organizations: Never    Relationship status: Living with partner  Other Topics Concern  . Not on file  Social History Narrative  . Not on file    Outpatient Encounter Medications as of 06/30/2017  Medication Sig  . benazepril (LOTENSIN) 40 MG tablet Take 1 tablet (40 mg total) by mouth daily.  . Cholecalciferol (VITAMIN D-1000 MAX ST) 1000 UNITS tablet Take by mouth.  . metoprolol succinate (TOPROL-XL) 25 MG 24 hr tablet Take 1 tablet (25 mg total) by mouth daily.  . Multiple Vitamin (MULTIVITAMIN) tablet Take 1 tablet by mouth daily.  . sertraline (ZOLOFT) 50 MG tablet Take 1 tablet (50 mg total) by mouth daily.  . traMADol (ULTRAM) 50 MG  tablet Take 1-2 tablets (50-100 mg total) by mouth every 6 (six) hours as needed.  Marland Kitchen aspirin 81 MG tablet Take 81 mg by mouth.  . [DISCONTINUED] amoxicillin (AMOXIL) 875 MG tablet Take 1 tablet (875 mg total) by mouth 2 (two) times daily. (Patient not taking: Reported on 06/30/2017)   No facility-administered encounter medications on file as of 06/30/2017.     Activities of Daily Living In your present state of health, do you have any difficulty performing the following activities: 06/30/2017  Hearing? N  Vision? N  Difficulty concentrating or making decisions? N  Walking or climbing stairs? N  Dressing or bathing? N  Doing errands, shopping? N  Preparing Food and eating ? N  Using the Toilet? N  In the past six months, have you accidently leaked urine? Y  Comment pads for protection   Do you have problems with loss of bowel control? N  Managing your Medications? N  Managing your Finances? N  Housekeeping or managing your Housekeeping? N  Some recent data might be hidden    Patient Care Team: Guadalupe Maple, MD as PCP - General (Family Medicine) Ardell Isaacs, NP as Nurse Practitioner (Surgical Oncology)    Assessment:   This is a routine wellness examination for [place].  Exercise Activities and Dietary recommendations Current Exercise Habits: The patient does not participate in regular exercise at present, Exercise limited by: None identified  Goals    . DIET - INCREASE WATER INTAKE     Recommend drinking at least 6-8 glasses of water a day        Fall Risk Fall Risk  06/30/2017 05/29/2017 08/22/2016 08/22/2016 03/11/2016  Falls in the past year? No No No No No  Number falls in past yr: - - - - -  Injury with Fall? - - - - -   Is the patient's home free of loose throw rugs in walkways, pet beds, electrical cords, etc?   yes      Grab bars in the bathroom? no      Handrails on the stairs?   no stairs       Adequate lighting?   yes  Timed Get Up and Go performed:  Completed in 8 seconds with no use of assistive devices, steady gait. No intervention needed at this time.   Depression Screen PHQ 2/9 Scores 06/30/2017 05/29/2017 08/22/2016 08/22/2016  PHQ - 2 Score 0 0 0 0     Cognitive Function     6CIT Screen 06/30/2017  What Year? 0 points  What month? 0  points  What time? 0 points  Count back from 20 0 points  Months in reverse 0 points  Repeat phrase 0 points  Total Score 0    Immunization History  Administered Date(s) Administered  . Influenza, High Dose Seasonal PF 02/07/2016  . Influenza,inj,Quad PF,6+ Mos 11/28/2014  . Pneumococcal Conjugate-13 05/24/2015  . Pneumococcal Polysaccharide-23 06/30/2017  . Td 01/29/2004  . Tdap 07/19/2015  . Zoster 10/22/2011, 09/07/2012    Qualifies for Shingles Vaccine? Yes, discussed shingrix vaccine  Screening Tests Health Maintenance  Topic Date Due  . DEXA SCAN  02/01/2017  . INFLUENZA VACCINE  08/28/2017  . MAMMOGRAM  03/13/2018  . COLONOSCOPY  12/07/2019  . TETANUS/TDAP  07/18/2025  . Hepatitis C Screening  Completed  . PNA vac Low Risk Adult  Completed    Cancer Screenings: Lung: Low Dose CT Chest recommended if Age 70-80 years, 30 pack-year currently smoking OR have quit w/in 15years. Patient does qualify. Message sent to Jeb Levering to see if patient qualifies  Breast:  Up to date on Mammogram? Yes ordered for [facility] - patient to schedule Up to date of Bone Density/Dexa? No , scheduled for 08/03/2017 Colorectal: completed 12/06/2009  Additional Screenings:  Hepatitis C Screening: completed 06/30/2015     Plan:    I have personally reviewed and addressed the Medicare Annual Wellness questionnaire and have noted the following in the patient's chart:  A. Medical and social history B. Use of alcohol, tobacco or illicit drugs  C. Current medications and supplements D. Functional ability and status E.  Nutritional status F.  Physical activity G. Advance directives H. List of other  physicians I.  Hospitalizations, surgeries, and ER visits in previous 12 months J.  [place] such as hearing and vision if needed, cognitive and depression L. Referrals and appointments   In addition, I have reviewed and discussed with patient certain preventive protocols, quality metrics, and best practice recommendations. A written personalized care plan for preventive services as well as general preventive health recommendations were provided to patient.   Signed,  Tyler Aas, LPN Nurse Health Advisor   Nurse Notes:none

## 2017-06-30 NOTE — Patient Instructions (Addendum)
Casey Summers , Thank you for taking time to come for your Medicare Wellness Visit. I appreciate your ongoing commitment to your health goals. Please review the following plan we discussed and let me know if I can assist you in the future.   Screening recommendations/referrals: Colonoscopy: completed 12/06/2009  Mammogram: completed 03/13/2016, ordered.  Bone Density: completed 02/02/2015, scheduled 08/03/2017 Recommended yearly ophthalmology/optometry visit for glaucoma screening and checkup Recommended yearly dental visit for hygiene and checkup  Vaccinations: Influenza vaccine: due 09/2017 Pneumococcal vaccine: completed series Tdap vaccine: up to date  Shingles vaccine: shingrix eligible, check with your insurance company for coverage   Advanced directives: Advance directive discussed with you today. I have provided a copy for you to complete at home and have notarized. Once this is complete please bring a copy in to our office so we can scan it into your chart.  Conditions/risks identified: Recommend drinking at least 6-8 glasses of water a day   Next appointment: Follow up in one year for your annual wellness exam.    Preventive Care 65 Years and Older, Female Preventive care refers to lifestyle choices and visits with your health care provider that can promote health and wellness. What does preventive care include?  A yearly physical exam. This is also called an annual well check.  Dental exams once or twice a year.  Routine eye exams. Ask your health care provider how often you should have your eyes checked.  Personal lifestyle choices, including:  Daily care of your teeth and gums.  Regular physical activity.  Eating a healthy diet.  Avoiding tobacco and drug use.  Limiting alcohol use.  Practicing safe sex.  Taking low-dose aspirin every day.  Taking vitamin and mineral supplements as recommended by your health care provider. What happens during an annual well  check? The services and screenings done by your health care provider during your annual well check will depend on your age, overall health, lifestyle risk factors, and family history of disease. Counseling  Your health care provider may ask you questions about your:  Alcohol use.  Tobacco use.  Drug use.  Emotional well-being.  Home and relationship well-being.  Sexual activity.  Eating habits.  History of falls.  Memory and ability to understand (cognition).  Work and work Statistician.  Reproductive health. Screening  You may have the following tests or measurements:  Height, weight, and BMI.  Blood pressure.  Lipid and cholesterol levels. These may be checked every 5 years, or more frequently if you are over 50 years old.  Skin check.  Lung cancer screening. You may have this screening every year starting at age 31 if you have a 30-pack-year history of smoking and currently smoke or have quit within the past 15 years.  Fecal occult blood test (FOBT) of the stool. You may have this test every year starting at age 14.  Flexible sigmoidoscopy or colonoscopy. You may have a sigmoidoscopy every 5 years or a colonoscopy every 10 years starting at age 19.  Hepatitis C blood test.  Hepatitis B blood test.  Sexually transmitted disease (STD) testing.  Diabetes screening. This is done by checking your blood sugar (glucose) after you have not eaten for a while (fasting). You may have this done every 1-3 years.  Bone density scan. This is done to screen for osteoporosis. You may have this done starting at age 72.  Mammogram. This may be done every 1-2 years. Talk to your health care provider about how often you  should have regular mammograms. Talk with your health care provider about your test results, treatment options, and if necessary, the need for more tests. Vaccines  Your health care provider may recommend certain vaccines, such as:  Influenza vaccine. This is  recommended every year.  Tetanus, diphtheria, and acellular pertussis (Tdap, Td) vaccine. You may need a Td booster every 10 years.  Zoster vaccine. You may need this after age 28.  Pneumococcal 13-valent conjugate (PCV13) vaccine. One dose is recommended after age 76.  Pneumococcal polysaccharide (PPSV23) vaccine. One dose is recommended after age 24. Talk to your health care provider about which screenings and vaccines you need and how often you need them. This information is not intended to replace advice given to you by your health care provider. Make sure you discuss any questions you have with your health care provider. Document Released: 02/10/2015 Document Revised: 10/04/2015 Document Reviewed: 11/15/2014 Elsevier Interactive Patient Education  2017 [place] Prevention in the Home Falls can cause injuries. They can happen to people of all ages. There are many things you can do to make your home safe and to help prevent falls. What can I do on the outside of my home?  Regularly fix the edges of walkways and driveways and fix any cracks.  Remove anything that might make you trip as you walk through a door, such as a raised step or threshold.  Trim any bushes or trees on the path to your home.  Use bright outdoor lighting.  Clear any walking paths of anything that might make someone trip, such as rocks or tools.  Regularly check to see if handrails are loose or broken. Make sure that both sides of any steps have handrails.  Any raised decks and porches should have guardrails on the edges.  Have any leaves, snow, or ice cleared regularly.  Use sand or salt on walking paths during winter.  Clean up any spills in your garage right away. This includes oil or grease spills. What can I do in the bathroom?  Use night lights.  Install grab bars by the toilet and in the tub and shower. Do not use towel bars as grab bars.  Use non-skid mats or decals in the tub or  shower.  If you need to sit down in the shower, use a plastic, non-slip stool.  Keep the floor dry. Clean up any water that spills on the floor as soon as it happens.  Remove soap buildup in the tub or shower regularly.  Attach bath mats securely with double-sided non-slip rug tape.  Do not have throw rugs and other things on the floor that can make you trip. What can I do in the bedroom?  Use night lights.  Make sure that you have a light by your bed that is easy to reach.  Do not use any sheets or blankets that are too big for your bed. They should not hang down onto the floor.  Have a firm chair that has side arms. You can use this for support while you get dressed.  Do not have throw rugs and other things on the floor that can make you trip. What can I do in the kitchen?  Clean up any spills right away.  Avoid walking on wet floors.  Keep items that you use a lot in easy-to-reach places.  If you need to reach something above you, use a strong step stool that has a grab bar.  Keep electrical cords out  of the way.  Do not use floor polish or wax that makes floors slippery. If you must use wax, use non-skid floor wax.  Do not have throw rugs and other things on the floor that can make you trip. What can I do with my stairs?  Do not leave any items on the stairs.  Make sure that there are handrails on both sides of the stairs and use them. Fix handrails that are broken or loose. Make sure that handrails are as long as the stairways.  Check any carpeting to make sure that it is firmly attached to the stairs. Fix any carpet that is loose or worn.  Avoid having throw rugs at the top or bottom of the stairs. If you do have throw rugs, attach them to the floor with carpet tape.  Make sure that you have a light switch at the top of the stairs and the bottom of the stairs. If you do not have them, ask someone to add them for you. What else can I do to help prevent  falls?  Wear shoes that:  Do not have high heels.  Have rubber bottoms.  Are comfortable and fit you well.  Are closed at the toe. Do not wear sandals.  If you use a stepladder:  Make sure that it is fully opened. Do not climb a closed stepladder.  Make sure that both sides of the stepladder are locked into place.  Ask someone to hold it for you, if possible.  Clearly mark and make sure that you can see:  Any grab bars or handrails.  First and last steps.  Where the edge of each step is.  Use tools that help you move around (mobility aids) if they are needed. These include:  Canes.  Walkers.  Scooters.  Crutches.  Turn on the lights when you go into a dark area. Replace any light bulbs as soon as they burn out.  Set up your furniture so you have a clear path. Avoid moving your furniture around.  If any of your floors are uneven, fix them.  If there are any pets around you, be aware of where they are.  Review your medicines with your doctor. Some medicines can make you feel dizzy. This can increase your chance of falling. Ask your doctor what other things that you can do to help prevent falls. This information is not intended to replace advice given to you by your health care provider. Make sure you discuss any questions you have with your health care provider. Document Released: 11/10/2008 Document Revised: 06/22/2015 Document Reviewed: 02/18/2014 Elsevier Interactive Patient Education  2017 [place].  Pneumococcal Polysaccharide Vaccine: What You Need to Know 1. Why get vaccinated? Vaccination can protect older adults (and some children and younger adults) from pneumococcal disease. Pneumococcal disease is caused by bacteria that can spread from person to person through close contact. It can cause ear infections, and it can also lead to more serious infections of the:  Lungs (pneumonia),  Blood (bacteremia), and  Covering of the brain and spinal cord  (meningitis). Meningitis can cause deafness and brain damage, and it can be fatal.  Anyone can get pneumococcal disease, but children under 54 years of age, people with certain medical conditions, adults over 24 years of age, and cigarette smokers are at the highest risk. About 18,000 older adults die each year from pneumococcal disease in the [place]. Treatment of pneumococcal infections with penicillin and other drugs used to be  more effective. But some strains of the disease have become resistant to these drugs. This makes prevention of the disease, through vaccination, even more important. 2. Pneumococcal polysaccharide vaccine (PPSV23) Pneumococcal polysaccharide vaccine (PPSV23) protects against 23 types of pneumococcal bacteria. It will not prevent all pneumococcal disease. PPSV23 is recommended for:  All adults 25 years of age and older,  Anyone 2 through 70 years of age with certain long-term health problems,  Anyone 2 through 70 years of age with a weakened immune system,  Adults 43 through 70 years of age who smoke cigarettes or have asthma.  Most people need only one dose of PPSV. A second dose is recommended for certain high-risk groups. People 54 and older should get a dose even if they have gotten one or more doses of the vaccine before they turned 65. Your healthcare provider can give you more information about these recommendations. Most healthy adults develop protection within 2 to 3 weeks of getting the shot. 3. Some people should not get this vaccine  Anyone who has had a life-threatening allergic reaction to PPSV should not get another dose.  Anyone who has a severe allergy to any component of PPSV should not receive it. Tell your provider if you have any severe allergies.  Anyone who is moderately or severely ill when the shot is scheduled may be asked to wait until they recover before getting the vaccine. Someone with a mild illness can usually be  vaccinated.  Children less than 33 years of age should not receive this vaccine.  There is no evidence that PPSV is harmful to either a pregnant woman or to her fetus. However, as a precaution, women who need the vaccine should be vaccinated before becoming pregnant, if possible. 4. Risks of a vaccine reaction With any medicine, including vaccines, there is a chance of side effects. These are usually mild and go away on their own, but serious reactions are also possible. About half of people who get PPSV have mild side effects, such as redness or pain where the shot is given, which go away within about two days. Less than 1 out of 100 people develop a fever, muscle aches, or more severe local reactions. Problems that could happen after any vaccine:  People sometimes faint after a medical procedure, including vaccination. Sitting or lying down for about 15 minutes can help prevent fainting, and injuries caused by a fall. Tell your doctor if you feel dizzy, or have vision changes or ringing in the ears.  Some people get severe pain in the shoulder and have difficulty moving the arm where a shot was given. This happens very rarely.  Any medication can cause a severe allergic reaction. Such reactions from a vaccine are very rare, estimated at about 1 in a million doses, and would happen within a few minutes to a few hours after the vaccination. As with any medicine, there is a very remote chance of a vaccine causing a serious injury or death. The safety of vaccines is always being monitored. For more information, visit: http://www.aguilar.org/ 5. What if there is a serious reaction? What should I look for? Look for anything that concerns you, such as signs of a severe allergic reaction, very high fever, or unusual behavior. Signs of a severe allergic reaction can include hives, swelling of the face and throat, difficulty breathing, a fast heartbeat, dizziness, and weakness. These would usually  start a few minutes to a few hours after the vaccination. What should I do?  If you think it is a severe allergic reaction or other emergency that can't wait, call 9-1-1 or get to the nearest hospital. Otherwise, call your doctor. Afterward, the reaction should be reported to the Vaccine Adverse Event Reporting System (VAERS). Your doctor might file this report, or you can do it yourself through the VAERS web site at www.vaers.SamedayNews.es, or by calling (224) 053-1279. VAERS does not give medical advice. 6. How can I learn more?  Ask your doctor. He or she can give you the vaccine package insert or suggest other sources of information.  Call your local or state health department.  Contact the Centers for Disease Control and Prevention (CDC): ? Call 419-548-9360 (1-800-CDC-INFO) or ? Visit CDC's website at http://hunter.com/ CDC Pneumococcal Polysaccharide Vaccine VIS (05/21/13) This information is not intended to replace advice given to you by your health care provider. Make sure you discuss any questions you have with your health care provider. Document Released: 11/11/2005 Document Revised: 10/05/2015 Document Reviewed: 10/05/2015 Elsevier Interactive Patient Education  2017 [employer].

## 2017-07-01 ENCOUNTER — Telehealth: Payer: Self-pay | Admitting: *Deleted

## 2017-07-01 ENCOUNTER — Other Ambulatory Visit: Payer: Self-pay | Admitting: *Deleted

## 2017-07-01 DIAGNOSIS — Z122 Encounter for screening for malignant neoplasm of respiratory organs: Secondary | ICD-10-CM

## 2017-07-01 DIAGNOSIS — Z87891 Personal history of nicotine dependence: Secondary | ICD-10-CM

## 2017-07-01 NOTE — Telephone Encounter (Signed)
Received referral for initial lung cancer screening scan. Contacted patient and obtained smoking history,(former, quit 2010, 30 pack year) as well as answering questions related to screening process. Patient denies signs of lung cancer such as weight loss or hemoptysis. Patient denies comorbidity that would prevent curative treatment if lung cancer were found. Patient is scheduled for shared decision making visit and CT scan on 07/15/17.

## 2017-07-04 NOTE — Telephone Encounter (Signed)
ERROR

## 2017-07-09 DIAGNOSIS — R928 Other abnormal and inconclusive findings on diagnostic imaging of breast: Secondary | ICD-10-CM | POA: Diagnosis not present

## 2017-07-09 DIAGNOSIS — Z1231 Encounter for screening mammogram for malignant neoplasm of breast: Secondary | ICD-10-CM | POA: Diagnosis not present

## 2017-07-09 LAB — HM MAMMOGRAPHY

## 2017-07-10 DIAGNOSIS — H6980 Other specified disorders of Eustachian tube, unspecified ear: Secondary | ICD-10-CM | POA: Diagnosis not present

## 2017-07-10 DIAGNOSIS — H903 Sensorineural hearing loss, bilateral: Secondary | ICD-10-CM | POA: Diagnosis not present

## 2017-07-10 DIAGNOSIS — H6122 Impacted cerumen, left ear: Secondary | ICD-10-CM | POA: Diagnosis not present

## 2017-07-10 DIAGNOSIS — J301 Allergic rhinitis due to pollen: Secondary | ICD-10-CM | POA: Diagnosis not present

## 2017-07-14 ENCOUNTER — Telehealth: Payer: Self-pay | Admitting: Nurse Practitioner

## 2017-07-15 ENCOUNTER — Ambulatory Visit
Admission: RE | Admit: 2017-07-15 | Discharge: 2017-07-15 | Disposition: A | Discharge status: Home / Self Care | Payer: Medicare Other | Source: Ambulatory Visit | Attending: Nurse Practitioner | Admitting: Nurse Practitioner

## 2017-07-15 ENCOUNTER — Inpatient Hospital Stay: Payer: Medicare Other | Attending: Nurse Practitioner | Admitting: Nurse Practitioner

## 2017-07-15 ENCOUNTER — Encounter: Payer: Self-pay | Admitting: Nurse Practitioner

## 2017-07-15 DIAGNOSIS — I700 Atherosclerosis of aorta: Secondary | ICD-10-CM | POA: Insufficient documentation

## 2017-07-15 DIAGNOSIS — Z87891 Personal history of nicotine dependence: Secondary | ICD-10-CM | POA: Diagnosis not present

## 2017-07-15 DIAGNOSIS — Z122 Encounter for screening for malignant neoplasm of respiratory organs: Secondary | ICD-10-CM | POA: Diagnosis not present

## 2017-07-15 DIAGNOSIS — J439 Emphysema, unspecified: Secondary | ICD-10-CM | POA: Diagnosis not present

## 2017-07-15 NOTE — Progress Notes (Signed)
In accordance with CMS guidelines, patient has met eligibility criteria including age, absence of signs or symptoms of lung cancer.  Social History   Tobacco Use  . Smoking status: Former Smoker    Packs/day: 1.25    Years: 24.00    Pack years: 30.00    Types: Cigarettes    Last attempt to quit: 08/28/2008    Years since quitting: 8.8  . Smokeless tobacco: Never Used  Substance Use Topics  . Alcohol use: Yes    Alcohol/week: 8.4 oz    Types: 14 Glasses of wine per week  . Drug use: No      A shared decision-making session was conducted prior to the performance of CT scan. This includes one or more decision aids, includes benefits and harms of screening, follow-up diagnostic testing, over-diagnosis, false positive rate, and total radiation exposure.   Counseling on the importance of adherence to annual lung cancer LDCT screening, impact of co-morbidities, and ability or willingness to undergo diagnosis and treatment is imperative for compliance of the program.   Counseling on the importance of continued smoking cessation for former smokers; the importance of smoking cessation for current smokers, and information about tobacco cessation interventions have been given to patient including [place] [facility]uit [facility] and [facility] quit [place] programs.   Written order for lung cancer screening with LDCT has been given to the patient and any and all questions have been answered to the best of my abilities.    Yearly follow up will be coordinated by Shawn Perkins, Thoracic Navigator.  Lauren Allen, DNP, AGNP-C [facility] [phone] (work cell) [phone] (office) 07/15/17 2:45 PM   

## 2017-07-17 ENCOUNTER — Telehealth: Payer: Self-pay | Admitting: *Deleted

## 2017-07-17 DIAGNOSIS — K769 Liver disease, unspecified: Secondary | ICD-10-CM

## 2017-07-17 NOTE — Telephone Encounter (Signed)
error 

## 2017-07-17 NOTE — Telephone Encounter (Signed)
Notified patient of LDCT lung cancer screening program results with recommendation for 12 month follow up imaging. Also notified of incidental findings noted below and is encouraged to discuss further with PCP who will receive a copy of this note and/or the CT report. Patient is aware that she may need an MRI to evaluate the liver finding. Patient verbalizes understanding.   IMPRESSION: 1. Lung-RADS 2S, benign appearance or behavior. Continue annual screening with low-dose chest CT without contrast in 12 months. 2. The "S" modifier represents a potentially clinically significant non pulmonary finding. Right-sided hepatic lesion which is indeterminate. Although this most likely represents a benign entity such as a hemangioma, given history of primary malignancy, metastatic disease cannot be excluded. Recommend further evaluation with pre and post contrast abdominal MRI, ideally with Eovist. 3. Aortic atherosclerosis (ICD10-I70.0) and emphysema (ICD10-J43.9).

## 2017-07-17 NOTE — Telephone Encounter (Signed)
Meg would you please handle this.

## 2017-07-18 NOTE — Addendum Note (Signed)
Addended by: Valerie Roys on: 07/18/2017 03:46 PM   Modules accepted: Orders

## 2017-07-18 NOTE — Telephone Encounter (Signed)
Order placed

## 2017-07-28 ENCOUNTER — Telehealth: Payer: Self-pay | Admitting: Family Medicine

## 2017-07-28 NOTE — Telephone Encounter (Signed)
Order was placed last week for MRI- It was my understanding that patient was aware of results and just needed order placed- can we find out if this is the case?

## 2017-07-28 NOTE — Telephone Encounter (Signed)
Copied from [place] [id number]. Topic: General - Other >> Jul 25, 2017  4:59 PM Mcneil, Ja-Kwan wrote: Reason for CRM: Pt states she had a lung screening at the [facility] and she was told by Dr. Dara Lords that she should hear from Dr. Jeananne Rama this week but she has not heard from anyone. Pt requests a call back. Cb# [phone]  >> Jul 28, 2017  8:41 AM Don Perking M wrote: LVM that provider was out of the office last week and won't be back until tomorrow. Forwarding for pt fu.  >> Jul 28, 2017  8:55 AM Amada Kingfisher, CMA wrote: Please advise.   Does not appear that it was ordered by this office.

## 2017-08-05 ENCOUNTER — Other Ambulatory Visit: Payer: Self-pay | Admitting: Family Medicine

## 2017-08-05 ENCOUNTER — Encounter (INDEPENDENT_AMBULATORY_CARE_PROVIDER_SITE_OTHER): Payer: Self-pay

## 2017-08-05 ENCOUNTER — Ambulatory Visit
Admission: RE | Admit: 2017-08-05 | Discharge: 2017-08-05 | Disposition: A | Discharge status: Home / Self Care | Payer: Medicare Other | Source: Ambulatory Visit | Attending: Family Medicine | Admitting: Family Medicine

## 2017-08-05 ENCOUNTER — Other Ambulatory Visit
Admission: RE | Admit: 2017-08-05 | Discharge: 2017-08-05 | Disposition: A | Discharge status: Home / Self Care | Payer: Medicare Other | Source: Ambulatory Visit | Attending: Family Medicine | Admitting: Family Medicine

## 2017-08-05 ENCOUNTER — Telehealth: Payer: Self-pay | Admitting: Family Medicine

## 2017-08-05 DIAGNOSIS — K769 Liver disease, unspecified: Secondary | ICD-10-CM | POA: Insufficient documentation

## 2017-08-05 DIAGNOSIS — I10 Essential (primary) hypertension: Secondary | ICD-10-CM

## 2017-08-05 DIAGNOSIS — Z01818 Encounter for other preprocedural examination: Secondary | ICD-10-CM

## 2017-08-05 LAB — CREATININE, SERUM
CREATININE: 0.59 mg/dL (ref 0.44–1.00)
GFR calc Af Amer: >60 mL/min (ref >60)

## 2017-08-05 LAB — BUN: BUN: 12 mg/dL (ref 8–23)

## 2017-08-05 MED ORDER — GADOXETATE DISODIUM 0.25 MMOL/ML IV SOLN
10.0000 mL | Freq: Once | INTRAVENOUS | Status: AC | PRN
  Administered 2017-08-05: 5 mL via INTRAVENOUS

## 2017-08-05 NOTE — Telephone Encounter (Signed)
-----   Message from [place], [place] sent at 08/05/2017 11:23 AM EDT ----- Contact: [phone] PLEASE PUT IN CREATININE/BUN ORDER FOR MRI CONTRAST. PATIENT TO ARRIVE AT 3pm TODAY.

## 2017-08-05 NOTE — Telephone Encounter (Signed)
Refill requests: Last refill Metoprolol ER; 08/22/16; #30; RF x 12 Last refill Sertraline ; 08/22/16; #30; RF x 12  Last OV: 05/29/17  PCP: Dr. Trixie Dredge.: [facility] [place], [place]  Refilled per protocol

## 2017-08-06 ENCOUNTER — Other Ambulatory Visit: Payer: Self-pay | Admitting: Family Medicine

## 2017-08-06 DIAGNOSIS — I10 Essential (primary) hypertension: Secondary | ICD-10-CM

## 2017-08-11 ENCOUNTER — Telehealth: Payer: Self-pay | Admitting: Family Medicine

## 2017-08-11 DIAGNOSIS — D1803 Hemangioma of intra-abdominal structures: Secondary | ICD-10-CM

## 2017-08-11 NOTE — Telephone Encounter (Signed)
Copied from [place] [id number]. Topic: Quick Communication - Lab Results >> Aug 11, 2017  4:10 PM Percell Belt A wrote: Pt called in and is requesting someone to call her back with MRI results??    Best number [phone]

## 2017-08-12 DIAGNOSIS — D1803 Hemangioma of intra-abdominal structures: Secondary | ICD-10-CM | POA: Insufficient documentation

## 2017-08-12 NOTE — Telephone Encounter (Signed)
Call pt 

## 2017-08-12 NOTE — Telephone Encounter (Signed)
You are correct. Per Burgess Estelle, RN the patient was informed of results.

## 2017-08-12 NOTE — Telephone Encounter (Signed)
Phone call Discussed with patient MRI showing liver lesion most likely hemangioma with repeat imaging in 6 to 12 months with MRI with liver

## 2017-08-12 NOTE — Telephone Encounter (Signed)
Patient was transferred to provider for telephone conversation.   

## 2017-09-09 ENCOUNTER — Ambulatory Visit (INDEPENDENT_AMBULATORY_CARE_PROVIDER_SITE_OTHER): Payer: Medicare Other | Admitting: Family Medicine

## 2017-09-09 ENCOUNTER — Encounter: Payer: Self-pay | Admitting: Family Medicine

## 2017-09-09 DIAGNOSIS — I10 Essential (primary) hypertension: Secondary | ICD-10-CM | POA: Diagnosis not present

## 2017-09-09 DIAGNOSIS — I700 Atherosclerosis of aorta: Secondary | ICD-10-CM

## 2017-09-09 DIAGNOSIS — G8929 Other chronic pain: Secondary | ICD-10-CM | POA: Diagnosis not present

## 2017-09-09 DIAGNOSIS — M5442 Lumbago with sciatica, left side: Secondary | ICD-10-CM

## 2017-09-09 DIAGNOSIS — F3342 Major depressive disorder, recurrent, in full remission: Secondary | ICD-10-CM | POA: Diagnosis not present

## 2017-09-09 DIAGNOSIS — Z7189 Other specified counseling: Secondary | ICD-10-CM

## 2017-09-09 LAB — MICROSCOPIC EXAMINATION
BACTERIA UA: NONE SEEN
RBC, UA: NONE SEEN /hpf (ref 0–2)

## 2017-09-09 LAB — URINALYSIS, ROUTINE W REFLEX MICROSCOPIC
BILIRUBIN UA: NEGATIVE
Glucose, UA: NEGATIVE
Nitrite, UA: NEGATIVE
PH UA: 5.5 (ref 5.0–7.5)
PROTEIN UA: NEGATIVE
RBC, UA: NEGATIVE
SPEC GRAV UA: 1.02 (ref 1.005–1.030)
Urobilinogen, Ur: 0.2 mg/dL (ref 0.2–1.0)

## 2017-09-09 MED ORDER — BENAZEPRIL HCL 40 MG PO TABS: 40.0000 mg | ORAL_TABLET | Freq: Every day | ORAL | 4 refills | Status: DC

## 2017-09-09 MED ORDER — METOPROLOL SUCCINATE ER 25 MG PO TB24: 25.0000 mg | ORAL_TABLET | Freq: Every day | ORAL | 4 refills | Status: DC

## 2017-09-09 MED ORDER — SERTRALINE HCL 50 MG PO TABS: 50.0000 mg | ORAL_TABLET | Freq: Every day | ORAL | 4 refills | Status: DC

## 2017-09-09 NOTE — Progress Notes (Signed)
BP 138/78 (BP Location: Left Arm)   Pulse 71   Temp 98 F (36.7 C) (Oral)   Ht 5' (1.524 m)   Wt 116 lb 4.8 oz (52.8 kg)   SpO2 98%   BMI 22.71 kg/m    Subjective:    Patient ID: Casey Summers, female    DOB: 1945/07/07, 72 y.o.   MRN: [id number]  HPI: Casey Summers is a 72 y.o. female  Annual Exam  Chief Complaint  Patient presents with  . Depression  . Hypertension  Patient all in all doing well nerves doing very good with no issues with depression Zoloft without problems or side effects. Blood pressure good control with no issues from benazepril takes without problems or side effects. Takes tramadol 50 mg generally half a tablet once a day has plenty left still.  Will take an extra one from time to time and has noticed that tramadol helps with sinus drainage.  Relevant past medical, surgical, family and social history reviewed and updated as indicated. Interim medical history since our last visit reviewed. Allergies and medications reviewed and updated.  Review of Systems  Constitutional: Negative.   Respiratory: Negative.   Cardiovascular: Negative.     Per HPI unless specifically indicated above     Objective:    BP 138/78 (BP Location: Left Arm)   Pulse 71   Temp 98 F (36.7 C) (Oral)   Ht 5' (1.524 m)   Wt 116 lb 4.8 oz (52.8 kg)   SpO2 98%   BMI 22.71 kg/m   Wt Readings from Last 3 Encounters:  09/09/17 116 lb 4.8 oz (52.8 kg)  07/15/17 115 lb (52.2 kg)  06/30/17 116 lb 11.2 oz (52.9 kg)    Physical Exam  Constitutional: She is oriented to person, place, and time. She appears well-developed and well-nourished.  HENT:  Head: Normocephalic and atraumatic.  Eyes: Conjunctivae and EOM are normal.  Neck: Normal range of motion.  Cardiovascular: Normal rate, regular rhythm and normal heart sounds.  Pulmonary/Chest: Effort normal and breath sounds normal.  Musculoskeletal: Normal range of motion.  Neurological: She is alert and oriented to  person, place, and time.  Skin: No erythema.  Psychiatric: She has a normal mood and affect. Her behavior is normal. Judgment and thought content normal.    Results for orders placed or performed during the hospital encounter of 08/05/17  Creatinine  Result Value Ref Range   Creatinine, Ser 0.59 0.44 - 1.00 mg/dL   GFR calc non Af Amer >60 >60 mL/min   GFR calc Af Amer >60 >60 mL/min  BUN  Result Value Ref Range   BUN 12 8 - 23 mg/dL      Assessment & Plan:   Problem List Items Addressed This Visit      Cardiovascular and Mediastinum   Hypertension    The current medical regimen is effective;  continue present plan and medications.       Relevant Medications   metoprolol succinate (TOPROL-XL) 25 MG 24 hr tablet   benazepril (LOTENSIN) 40 MG tablet   Other Relevant Orders   Comprehensive metabolic panel   CBC with Differential/Platelet   TSH   Urinalysis, Routine w reflex microscopic   Aortic atherosclerosis (HCC)   Relevant Medications   metoprolol succinate (TOPROL-XL) 25 MG 24 hr tablet   benazepril (LOTENSIN) 40 MG tablet   Other Relevant Orders   Comprehensive metabolic panel   Lipid panel   CBC with Differential/Platelet  TSH     Other   Low back pain    The current medical regimen is effective;  continue present plan and medications.       Relevant Orders   TSH   Advanced care planning/counseling discussion    A voluntary discussion about advanced care planning including explanation and discussion of advanced directives was extentively discussed with the patient.  Explained about the healthcare proxy and living will was reviewed and packet with forms with expiration of how to fill them out was given.  Time spent: Encounter 16+ min individuals present: Patient      Depression    The current medical regimen is effective;  continue present plan and medications.       Relevant Medications   sertraline (ZOLOFT) 50 MG tablet   Other Relevant Orders    TSH       Follow up plan: Return in about 6 months (around 03/12/2018) for BMP.

## 2017-09-09 NOTE — Assessment & Plan Note (Signed)
The current medical regimen is effective;  continue present plan and medications.  

## 2017-09-09 NOTE — Assessment & Plan Note (Signed)
A voluntary discussion about advanced care planning including explanation and discussion of advanced directives was extentively discussed with the patient.  Explained about the healthcare proxy and living will was reviewed and packet with forms with expiration of how to fill them out was given.  Time spent: Encounter 16+ min individuals present: Patient 

## 2017-09-10 LAB — COMPREHENSIVE METABOLIC PANEL
ALK PHOS: 92 IU/L (ref 39–117)
ALT: 79 IU/L — AB (ref 0–32)
AST: 57 IU/L — ABNORMAL HIGH (ref 0–40)
Albumin/Globulin Ratio: 2.2 (ref 1.2–2.2)
Albumin: 4.8 g/dL (ref 3.5–4.8)
BILIRUBIN TOTAL: 0.8 mg/dL (ref 0.0–1.2)
BUN/Creatinine Ratio: 17 (ref 12–28)
BUN: 11 mg/dL (ref 8–27)
CHLORIDE: 101 mmol/L (ref 96–106)
CO2: 22 mmol/L (ref 20–29)
CREATININE: 0.64 mg/dL (ref 0.57–1.00)
Calcium: 10 mg/dL (ref 8.7–10.3)
GFR calc Af Amer: 105 mL/min/{1.73_m2} (ref >59)
GFR calc non Af Amer: 91 mL/min/{1.73_m2} (ref >59)
GLUCOSE: 90 mg/dL (ref 65–99)
Globulin, Total: 2.2 g/dL (ref 1.5–4.5)
Potassium: 4.6 mmol/L (ref 3.5–5.2)
Sodium: 136 mmol/L (ref 134–144)
TOTAL PROTEIN: 7 g/dL (ref 6.0–8.5)

## 2017-09-10 LAB — CBC WITH DIFFERENTIAL/PLATELET
BASOS: 0 %
Basophils Absolute: 0 10*3/uL (ref 0.0–0.2)
EOS (ABSOLUTE): 0.1 10*3/uL (ref 0.0–0.4)
EOS: 1 %
HEMATOCRIT: 43.5 % (ref 34.0–46.6)
Hemoglobin: 14.8 g/dL (ref 11.1–15.9)
Immature Grans (Abs): 0 10*3/uL (ref 0.0–0.1)
Immature Granulocytes: 0 %
LYMPHS ABS: 0.9 10*3/uL (ref 0.7–3.1)
Lymphs: 20 %
MCH: 34.3 pg — AB (ref 26.6–33.0)
MCHC: 34 g/dL (ref 31.5–35.7)
MCV: 101 fL — AB (ref 79–97)
MONOS ABS: 0.5 10*3/uL (ref 0.1–0.9)
Monocytes: 12 %
NEUTROS ABS: 2.9 10*3/uL (ref 1.4–7.0)
Neutrophils: 67 %
Platelets: 186 10*3/uL (ref 150–450)
RBC: 4.32 x10E6/uL (ref 3.77–5.28)
RDW: 12.7 % (ref 12.3–15.4)
WBC: 4.4 10*3/uL (ref 3.4–10.8)

## 2017-09-10 LAB — LIPID PANEL
CHOLESTEROL TOTAL: 224 mg/dL — AB (ref 100–199)
Chol/HDL Ratio: 2.9 ratio (ref 0.0–4.4)
HDL: 77 mg/dL (ref >39)
LDL Calculated: 125 mg/dL — ABNORMAL HIGH (ref 0–99)
Triglycerides: 108 mg/dL (ref 0–149)
VLDL Cholesterol Cal: 22 mg/dL (ref 5–40)

## 2017-09-10 LAB — TSH: TSH: 0.684 u[IU]/mL (ref 0.450–4.500)

## 2017-09-15 ENCOUNTER — Telehealth: Payer: Self-pay

## 2017-10-08 NOTE — Telephone Encounter (Signed)
Opened in error

## 2017-10-28 DIAGNOSIS — Z23 Encounter for immunization: Secondary | ICD-10-CM | POA: Diagnosis not present

## 2018-01-02 ENCOUNTER — Encounter: Payer: Self-pay | Admitting: Family Medicine

## 2018-02-03 ENCOUNTER — Ambulatory Visit: Payer: Medicare Other | Admitting: Family Medicine

## 2018-02-16 ENCOUNTER — Encounter: Payer: Self-pay | Admitting: Family Medicine

## 2018-02-16 ENCOUNTER — Telehealth: Payer: Self-pay | Admitting: Family Medicine

## 2018-02-16 ENCOUNTER — Ambulatory Visit (INDEPENDENT_AMBULATORY_CARE_PROVIDER_SITE_OTHER): Payer: Medicare Other | Admitting: Family Medicine

## 2018-02-16 VITALS — BP 120/76 | HR 70 | Temp 98.5°F | Ht 60.0 in | Wt 112.0 lb

## 2018-02-16 DIAGNOSIS — I10 Essential (primary) hypertension: Secondary | ICD-10-CM | POA: Diagnosis not present

## 2018-02-16 DIAGNOSIS — C50111 Malignant neoplasm of central portion of right female breast: Secondary | ICD-10-CM | POA: Diagnosis not present

## 2018-02-16 DIAGNOSIS — Z170 Estrogen receptor positive status [ER+]: Secondary | ICD-10-CM | POA: Diagnosis not present

## 2018-02-16 DIAGNOSIS — G8929 Other chronic pain: Secondary | ICD-10-CM

## 2018-02-16 DIAGNOSIS — I700 Atherosclerosis of aorta: Secondary | ICD-10-CM | POA: Diagnosis not present

## 2018-02-16 DIAGNOSIS — M5442 Lumbago with sciatica, left side: Secondary | ICD-10-CM

## 2018-02-16 DIAGNOSIS — F3342 Major depressive disorder, recurrent, in full remission: Secondary | ICD-10-CM | POA: Diagnosis not present

## 2018-02-16 MED ORDER — TRAMADOL HCL 50 MG PO TABS: 50.0000 mg | ORAL_TABLET | Freq: Four times a day (QID) | ORAL | 5 refills | Status: DC | PRN

## 2018-02-16 NOTE — Assessment & Plan Note (Signed)
stable °

## 2018-02-16 NOTE — Progress Notes (Signed)
BP 120/76 (BP Location: Left Arm)   Pulse 70   Temp 98.5 F (36.9 C) (Oral)   Ht 5' (1.524 m)   Wt 112 lb (50.8 kg)   SpO2 97%   BMI 21.87 kg/m    Subjective:    Patient ID: Casey Summers, female    DOB: 10/02/1945, 73 y.o.   MRN: [id number]  HPI: Casey Summers is a 73 y.o. female  Chief Complaint  Patient presents with  . Follow-up  . Hypertension  Patient follow-up hypertension doing well takes medications without problems or issues.  Good control of blood pressure Takes sertraline for nerves which is good control no issues with sertraline. Takes tramadol half a tablet generally morning and evening for back pain with good control has had no increasing usage and consistent usage.  Will give refill.  Relevant past medical, surgical, family and social history reviewed and updated as indicated. Interim medical history since our last visit reviewed. Allergies and medications reviewed and updated.  Review of Systems  Constitutional: Negative.   Respiratory: Negative.   Cardiovascular: Negative.     Per HPI unless specifically indicated above     Objective:    BP 120/76 (BP Location: Left Arm)   Pulse 70   Temp 98.5 F (36.9 C) (Oral)   Ht 5' (1.524 m)   Wt 112 lb (50.8 kg)   SpO2 97%   BMI 21.87 kg/m   Wt Readings from Last 3 Encounters:  02/16/18 112 lb (50.8 kg)  09/09/17 116 lb 4.8 oz (52.8 kg)  07/15/17 115 lb (52.2 kg)    Physical Exam Constitutional:      Appearance: She is well-developed.  HENT:     Head: Normocephalic and atraumatic.  Eyes:     Conjunctiva/sclera: Conjunctivae normal.  Neck:     Musculoskeletal: Normal range of motion.  Cardiovascular:     Rate and Rhythm: Normal rate and regular rhythm.     Heart sounds: Normal heart sounds.  Pulmonary:     Effort: Pulmonary effort is normal.     Breath sounds: Normal breath sounds.  Musculoskeletal: Normal range of motion.  Skin:    Findings: No erythema.  Neurological:   Mental Status: She is alert and oriented to person, place, and time.  Psychiatric:        Behavior: Behavior normal.        Thought Content: Thought content normal.        Judgment: Judgment normal.     Results for orders placed or performed in visit on 09/09/17  Microscopic Examination  Result Value Ref Range   WBC, UA 0-5 0 - 5 /hpf   RBC, UA None seen 0 - 2 /hpf   Epithelial Cells (non renal) 0-10 0 - 10 /hpf   Renal Epithel, UA 0-10 (A) None seen /hpf   Casts Present None seen /lpf   Cast Type White cell casts (A) N/A   Bacteria, UA None seen None seen/Few  Comprehensive metabolic panel  Result Value Ref Range   Glucose 90 65 - 99 mg/dL   BUN 11 8 - 27 mg/dL   Creatinine, Ser 0.64 0.57 - 1.00 mg/dL   GFR calc non Af Amer 91 >59 mL/min/1.73   GFR calc Af Amer 105 >59 mL/min/1.73   BUN/Creatinine Ratio 17 12 - 28   Sodium 136 134 - 144 mmol/L   Potassium 4.6 3.5 - 5.2 mmol/L   Chloride 101 96 - 106 mmol/L   CO2  22 20 - 29 mmol/L   Calcium 10.0 8.7 - 10.3 mg/dL   Total Protein 7.0 6.0 - 8.5 g/dL   Albumin 4.8 3.5 - 4.8 g/dL   Globulin, Total 2.2 1.5 - 4.5 g/dL   Albumin/Globulin Ratio 2.2 1.2 - 2.2   Bilirubin Total 0.8 0.0 - 1.2 mg/dL   Alkaline Phosphatase 92 39 - 117 IU/L   AST 57 (H) 0 - 40 IU/L   ALT 79 (H) 0 - 32 IU/L  Lipid panel  Result Value Ref Range   Cholesterol, Total 224 (H) 100 - 199 mg/dL   Triglycerides 108 0 - 149 mg/dL   HDL 77 >39 mg/dL   VLDL Cholesterol Cal 22 5 - 40 mg/dL   LDL Calculated 125 (H) 0 - 99 mg/dL   Chol/HDL Ratio 2.9 0.0 - 4.4 ratio  CBC with Differential/Platelet  Result Value Ref Range   WBC 4.4 3.4 - 10.8 x10E3/uL   RBC 4.32 3.77 - 5.28 x10E6/uL   Hemoglobin 14.8 11.1 - 15.9 g/dL   Hematocrit 43.5 34.0 - 46.6 %   MCV 101 (H) 79 - 97 fL   MCH 34.3 (H) 26.6 - 33.0 pg   MCHC 34.0 31.5 - 35.7 g/dL   RDW 12.7 12.3 - 15.4 %   Platelets 186 150 - 450 x10E3/uL   Neutrophils 67 Not Estab. %   Lymphs 20 Not Estab. %   Monocytes  12 Not Estab. %   Eos 1 Not Estab. %   Basos 0 Not Estab. %   Neutrophils Absolute 2.9 1.4 - 7.0 x10E3/uL   Lymphocytes Absolute 0.9 0.7 - 3.1 x10E3/uL   Monocytes Absolute 0.5 0.1 - 0.9 x10E3/uL   EOS (ABSOLUTE) 0.1 0.0 - 0.4 x10E3/uL   Basophils Absolute 0.0 0.0 - 0.2 x10E3/uL   Immature Granulocytes 0 Not Estab. %   Immature Grans (Abs) 0.0 0.0 - 0.1 x10E3/uL  TSH  Result Value Ref Range   TSH 0.684 0.450 - 4.500 uIU/mL  Urinalysis, Routine w reflex microscopic  Result Value Ref Range   Specific Gravity, UA 1.020 1.005 - 1.030   pH, UA 5.5 5.0 - 7.5   Color, UA Yellow Yellow   Appearance Ur Clear Clear   Leukocytes, UA 1+ (A) Negative   Protein, UA Negative Negative/Trace   Glucose, UA Negative Negative   Ketones, UA Trace (A) Negative   RBC, UA Negative Negative   Bilirubin, UA Negative Negative   Urobilinogen, Ur 0.2 0.2 - 1.0 mg/dL   Nitrite, UA Negative Negative   Microscopic Examination See below:       Assessment & Plan:   Problem List Items Addressed This Visit      Cardiovascular and Mediastinum   Hypertension - Primary    The current medical regimen is effective;  continue present plan and medications.       Relevant Orders   Basic metabolic panel   Aortic atherosclerosis (HCC)    stable        Other   Low back pain    The current medical regimen is effective;  continue present plan and medications.       Relevant Medications   traMADol (ULTRAM) 50 MG tablet   Breast cancer (HCC)    No treatment      Depression    The current medical regimen is effective;  continue present plan and medications.           Follow up plan: Return in about 6 months (around  08/17/2018) for Physical Exam.

## 2018-02-16 NOTE — Assessment & Plan Note (Signed)
The current medical regimen is effective;  continue present plan and medications.  

## 2018-02-16 NOTE — Assessment & Plan Note (Signed)
No treatment

## 2018-02-16 NOTE — Telephone Encounter (Signed)
Patient is requesting a referral for her mammogram to be sent to [facility]   She forgot to mention this to Dr Jeananne Rama.  Thank You

## 2018-02-16 NOTE — Telephone Encounter (Signed)
ok 

## 2018-02-17 NOTE — Telephone Encounter (Signed)
Can you please place order through [facility].

## 2018-02-17 NOTE — Telephone Encounter (Signed)
Order entered in [facility] for Casey Summers.

## 2018-03-16 ENCOUNTER — Telehealth: Payer: Self-pay | Admitting: Family Medicine

## 2018-03-16 DIAGNOSIS — Z170 Estrogen receptor positive status [ER+]: Principal | ICD-10-CM

## 2018-03-16 DIAGNOSIS — C50111 Malignant neoplasm of central portion of right female breast: Secondary | ICD-10-CM

## 2018-03-16 NOTE — Telephone Encounter (Signed)
Order placed, ready to fax  Copied from [place]. Topic: General - Other >> Mar 16, 2018  4:42 PM Yvette Rack wrote: Reason for CRM: Aldona Bar with [facility] stated they need the request for bilateral diagnostic mammogram faxed to [phone]

## 2018-03-17 NOTE — Telephone Encounter (Signed)
Order, Demographics, and insurance card printed and faxed to [facility]

## 2018-04-15 DIAGNOSIS — J069 Acute upper respiratory infection, unspecified: Secondary | ICD-10-CM | POA: Diagnosis not present

## 2018-04-20 ENCOUNTER — Telehealth: Payer: Self-pay | Admitting: Family Medicine

## 2018-04-20 NOTE — Telephone Encounter (Unsigned)
Copied from [place] [id number] [phone]. Topic: Medicare AWV >> Apr 20, 2018 10:27 AM Sherren Kerns wrote: Called to RESCHEDULE Medicare Annual Wellness Visit with the Nurse Health Advisor, due to change in [facility] schedule.  No answer at both Home and Mobile numbers.  Left message on both answering machines to call Lattie Haw at [phone].    If patient returns call, please note: their last AWV was on 06/30/2017, please schedule AWV with [facility] any date AFTER mid June to July at an available time and date.    Thank you! For any questions please contact: Janace Hoard at [phone] or Skype [email or web address]

## 2018-07-08 ENCOUNTER — Ambulatory Visit: Payer: Medicare Other

## 2018-07-29 ENCOUNTER — Telehealth: Payer: Self-pay | Admitting: *Deleted

## 2018-07-29 NOTE — Telephone Encounter (Signed)
Left message for patient to notify them that it is time to schedule annual low dose lung cancer screening CT scan. Instructed patient to call back to verify information prior to the scan being scheduled.  

## 2018-08-12 DIAGNOSIS — C50111 Malignant neoplasm of central portion of right female breast: Secondary | ICD-10-CM | POA: Diagnosis not present

## 2018-08-12 LAB — HM MAMMOGRAPHY

## 2018-08-17 ENCOUNTER — Ambulatory Visit (INDEPENDENT_AMBULATORY_CARE_PROVIDER_SITE_OTHER): Payer: Medicare Other | Admitting: Family Medicine

## 2018-08-17 ENCOUNTER — Ambulatory Visit (INDEPENDENT_AMBULATORY_CARE_PROVIDER_SITE_OTHER): Payer: Medicare Other

## 2018-08-17 ENCOUNTER — Encounter: Payer: Self-pay | Admitting: Family Medicine

## 2018-08-17 ENCOUNTER — Ambulatory Visit: Payer: Medicare Other | Admitting: Family Medicine

## 2018-08-17 ENCOUNTER — Other Ambulatory Visit: Payer: Self-pay

## 2018-08-17 VITALS — BP 119/66 | HR 91 | Ht 60.0 in | Wt 114.0 lb

## 2018-08-17 DIAGNOSIS — I10 Essential (primary) hypertension: Secondary | ICD-10-CM | POA: Diagnosis not present

## 2018-08-17 DIAGNOSIS — F3342 Major depressive disorder, recurrent, in full remission: Secondary | ICD-10-CM

## 2018-08-17 DIAGNOSIS — Z7189 Other specified counseling: Secondary | ICD-10-CM | POA: Diagnosis not present

## 2018-08-17 DIAGNOSIS — G8929 Other chronic pain: Secondary | ICD-10-CM

## 2018-08-17 DIAGNOSIS — Z0000 Encounter for general adult medical examination without abnormal findings: Secondary | ICD-10-CM | POA: Diagnosis not present

## 2018-08-17 DIAGNOSIS — Z780 Asymptomatic menopausal state: Secondary | ICD-10-CM

## 2018-08-17 DIAGNOSIS — I700 Atherosclerosis of aorta: Secondary | ICD-10-CM

## 2018-08-17 DIAGNOSIS — M5442 Lumbago with sciatica, left side: Secondary | ICD-10-CM

## 2018-08-17 MED ORDER — SERTRALINE HCL 50 MG PO TABS: 50.0000 mg | ORAL_TABLET | Freq: Every day | ORAL | 4 refills | Status: DC

## 2018-08-17 MED ORDER — METOPROLOL SUCCINATE ER 25 MG PO TB24: 25.0000 mg | ORAL_TABLET | Freq: Every day | ORAL | 4 refills | Status: DC

## 2018-08-17 MED ORDER — BENAZEPRIL HCL 40 MG PO TABS: 40.0000 mg | ORAL_TABLET | Freq: Every day | ORAL | 4 refills | Status: DC

## 2018-08-17 MED ORDER — TRAMADOL HCL 50 MG PO TABS: 50.0000 mg | ORAL_TABLET | Freq: Four times a day (QID) | ORAL | 5 refills | Status: DC | PRN

## 2018-08-17 NOTE — Progress Notes (Signed)
BP 119/66   Wt 114 lb (51.7 kg)   BMI 22.26 kg/m    Subjective:    Patient ID: Casey Summers, female    DOB: July 09, 1945, 73 y.o.   MRN: [id number]  HPI: Casey Summers is a 73 y.o. female  Med check Discussed with patient hypertension doing well no complaints from medications taken faithfully with good control and good blood pressure Discussed sertraline and depression patient having a great deal of stress with her son who is having issues.  Discussed can try Zoloft 100 mg patient reluctant to change prescription 200 mg so we will give prescription for 50 with possibility of taking 2 a day and may need to change prescription. Discussed tramadol reviewed [facility] website which was normal and showing no signs of abuse and gave refills  Relevant past medical, surgical, family and social history reviewed and updated as indicated. Interim medical history since our last visit reviewed. Allergies and medications reviewed and updated.  Review of Systems  Constitutional: Negative.   HENT: Negative.   Eyes: Negative.   Respiratory: Negative.   Cardiovascular: Negative.   Gastrointestinal: Negative.   Endocrine: Negative.   Genitourinary: Negative.   Musculoskeletal: Negative.   Skin: Negative.   Allergic/Immunologic: Negative.   Neurological: Negative.   Hematological: Negative.   Psychiatric/Behavioral: Negative.     Per HPI unless specifically indicated above     Objective:    BP 119/66   Wt 114 lb (51.7 kg)   BMI 22.26 kg/m   Wt Readings from Last 3 Encounters:  08/17/18 114 lb (51.7 kg)  02/16/18 112 lb (50.8 kg)  09/09/17 116 lb 4.8 oz (52.8 kg)    Physical Exam  Results for orders placed or performed in visit on 08/13/18  HM MAMMOGRAPHY  Result Value Ref Range   HM Mammogram 0-4 Bi-Rad 0-4 Bi-Rad, Self Reported Normal      Assessment & Plan:   Problem List Items Addressed This Visit      Cardiovascular and Mediastinum   Hypertension    The  current medical regimen is effective;  continue present plan and medications.       Relevant Medications   benazepril (LOTENSIN) 40 MG tablet   metoprolol succinate (TOPROL-XL) 25 MG 24 hr tablet   Aortic atherosclerosis (HCC)    No symptoms      Relevant Medications   benazepril (LOTENSIN) 40 MG tablet   metoprolol succinate (TOPROL-XL) 25 MG 24 hr tablet     Other   Low back pain    The current medical regimen is effective;  continue present plan and medications.       Relevant Medications   traMADol (ULTRAM) 50 MG tablet   Advanced care planning/counseling discussion    A voluntary discussion about advanced care planning including explanation and discussion of advanced directives was extentively discussed with the patient.  Explained about the healthcare proxy and living will was reviewed and packet with forms with expiration of how to fill them out was given.  Time spent: Encounter 16+ min individuals present: Patient      Depression    The current medical regimen is effective;  continue present plan and medications.       Relevant Medications   sertraline (ZOLOFT) 50 MG tablet      Telemedicine using audio/video telecommunications for a synchronous communication visit. Today's visit due to COVID-19 isolation precautions I connected with and verified that I am speaking with the correct person using two  identifiers.   I discussed the limitations, risks, security and privacy concerns of performing an evaluation and management service by telecommunication and the availability of in person appointments. I also discussed with the patient that there may be a patient responsible charge related to this service. The patient expressed understanding and agreed to proceed. The patient's location is home. I am at home.   I discussed the assessment and treatment plan with the patient. The patient was provided an opportunity to ask questions and all were answered. The patient agreed  with the plan and demonstrated an understanding of the instructions.   The patient was advised to call back or seek an in-person evaluation if the symptoms worsen or if the condition fails to improve as anticipated.   I provided 21+ minutes of time during this encounter. Follow up plan: Return in about 6 months (around 02/17/2019) for BMP, depression check, med check for tramadol.

## 2018-08-17 NOTE — Assessment & Plan Note (Signed)
A voluntary discussion about advanced care planning including explanation and discussion of advanced directives was extentively discussed with the patient.  Explained about the healthcare proxy and living will was reviewed and packet with forms with expiration of how to fill them out was given.  Time spent: Encounter 16+ min individuals present: Patient 

## 2018-08-17 NOTE — Progress Notes (Signed)
Subjective:   Casey Summers is a 73 y.o. female who presents for Medicare Annual (Subsequent) preventive examination.  Review of Systems:   Cardiac Risk Factors include: none;hypertension;advanced age (>85men, >48 women)     Objective:     Vitals: BP 119/66 Comment: patient reported  Pulse 91 Comment: patient reported  Ht 5' (1.524 m)   Wt 114 lb (51.7 kg) Comment: patient reported  BMI 22.26 kg/m   Body mass index is 22.26 kg/m.  Advanced Directives 08/17/2018 06/30/2017  Does Patient Have a Medical Advance Directive? Yes No  Type of Advance Directive Living will;Healthcare Power of Attorney -  [place] Jonte Shiller in Chart? No - copy requested -  Would patient like information on creating a medical advance directive? - Yes (MAU/Ambulatory/Procedural Areas - Information given)    Tobacco Social History   Tobacco Use  Smoking Status Former Smoker  . Packs/day: 1.25  . Years: 24.00  . Pack years: 30.00  . Types: Cigarettes  . Quit date: 08/28/2008  . Years since quitting: 9.9  Smokeless Tobacco Never Used     Counseling given: Not Answered   Clinical Intake:  Pre-visit preparation completed: Yes  Pain : No/denies pain     Nutritional Risks: None Diabetes: No  How often do you need to have someone help you when you read instructions, pamphlets, or other written materials from your doctor or pharmacy?: 1 - Never  Interpreter Needed?: No  Information entered by :: Makiya Jeune,LPN  Past Medical History:  Diagnosis Date  . Allergy   . Cancer (HCC)    breast  . Chronic mid back pain   . Depression   . Hypertension   . Osteoporosis    Past Surgical History:  Procedure Laterality Date  . ABDOMINAL HYSTERECTOMY    . BREAST LUMPECTOMY  2010   Family History  Problem Relation Age of Onset  . Congestive Heart Failure Mother   . Cancer Sister        breast   Social History   Socioeconomic History  . Marital status: Divorced   Spouse name: Not on file  . Number of children: Not on file  . Years of education: Not on file  . Highest education level: Associate degree: academic program  Occupational History  . Occupation: retired  Scientific laboratory technician  . Financial resource strain: Not hard at all  . Food insecurity    Worry: Never true    Inability: Never true  . Transportation needs    Medical: No    Non-medical: No  Tobacco Use  . Smoking status: Former Smoker    Packs/day: 1.25    Years: 24.00    Pack years: 30.00    Types: Cigarettes    Quit date: 08/28/2008    Years since quitting: 9.9  . Smokeless tobacco: Never Used  Substance and Sexual Activity  . Alcohol use: Yes    Alcohol/week: 12.0 standard drinks    Types: 12 Glasses of wine per week  . Drug use: No  . Sexual activity: Not on file  Lifestyle  . Physical activity    Days per week: 0 days    Minutes per session: 0 min  . Stress: Not at all  Relationships  . Social connections    Talks on phone: More than three times a week    Gets together: More than three times a week    Attends religious service: 1 to 4 times per year  Active member of club or organization: No    Attends meetings of clubs or organizations: Never    Relationship status: Living with partner  Other Topics Concern  . Not on file  Social History Narrative  . Not on file    Outpatient Encounter Medications as of 08/17/2018  Medication Sig  . aspirin 81 MG tablet Take 81 mg by mouth.  . benazepril (LOTENSIN) 40 MG tablet Take 1 tablet (40 mg total) by mouth daily.  . Cholecalciferol (VITAMIN D-1000 MAX ST) 1000 UNITS tablet Take by mouth.  . metoprolol succinate (TOPROL-XL) 25 MG 24 hr tablet Take 1 tablet (25 mg total) by mouth daily.  . Multiple Vitamin (MULTIVITAMIN) tablet Take 1 tablet by mouth daily.  . sertraline (ZOLOFT) 50 MG tablet Take 1 tablet (50 mg total) by mouth daily.  . traMADol (ULTRAM) 50 MG tablet Take 1-2 tablets (50-100 mg total) by mouth every 6  (six) hours as needed.   No facility-administered encounter medications on file as of 08/17/2018.     Activities of Daily Living In your present state of health, do you have any difficulty performing the following activities: 08/17/2018  Hearing? N  Comment with background noise  Vision? N  Difficulty concentrating or making decisions? N  Walking or climbing stairs? N  Dressing or bathing? N  Doing errands, shopping? N  Preparing Food and eating ? N  Using the Toilet? N  In the past six months, have you accidently leaked urine? N  Comment panty liner for protection  Do you have problems with loss of bowel control? N  Managing your Medications? N  Managing your Finances? N  Housekeeping or managing your Housekeeping? N  Some recent data might be hidden    Patient Care Team: Guadalupe Maple, MD as PCP - General (Family Medicine) Ardell Isaacs, NP as Nurse Practitioner (Surgical Oncology)    Assessment:   This is a routine wellness examination for Casey Summers.  Exercise Activities and Dietary recommendations Current Exercise Habits: The patient does not participate in regular exercise at present, Exercise limited by: None identified  Goals    . DIET - INCREASE WATER INTAKE     Recommend drinking at least 6-8 glasses of water a day        Fall Risk: Fall Risk  08/17/2018 02/16/2018 09/09/2017 06/30/2017 06/30/2017  Falls in the past year? 0 0 No No No  Number falls in past yr: - - - - -  Injury with Fall? - - - - -  Follow up - Falls evaluation completed - - -    FALL RISK PREVENTION PERTAINING TO THE HOME:  Any stairs in or around the home? Yes  If so, are there any without handrails? No   Home free of loose throw rugs in walkways, pet beds, electrical cords, etc? Yes  Adequate lighting in your home to reduce risk of falls? Yes   ASSISTIVE DEVICES UTILIZED TO PREVENT FALLS:  Life alert? No  Use of a cane, walker or w/c? No  Grab bars in the bathroom? No  Shower  chair or bench in shower? No  Elevated toilet seat or a handicapped toilet? No   DME ORDERS:  DME order needed?  No   TIMED UP AND GO:  Unable to perform    Depression Screen PHQ 2/9 Scores 08/17/2018 09/09/2017 06/30/2017 06/30/2017  PHQ - 2 Score 0 0 0 0  PHQ- 9 Score - 0 0 -     Cognitive  Function     6CIT Screen 08/17/2018 06/30/2017  What Year? 0 points 0 points  What month? 0 points 0 points  What time? 0 points 0 points  Count back from 20 0 points 0 points  Months in reverse 0 points 0 points  Repeat phrase - 0 points  Total Score - 0    Immunization History  Administered Date(s) Administered  . Influenza, High Dose Seasonal PF 02/07/2016, 10/27/2017  . Influenza,inj,Quad PF,6+ Mos 11/28/2014  . Pneumococcal Conjugate-13 05/24/2015  . Pneumococcal Polysaccharide-23 06/30/2017  . Td 01/29/2004  . Tdap 07/19/2015  . Zoster 10/22/2011, 09/07/2012    Qualifies for Shingles Vaccine? Yes  Zostavax completed 2014. Due for Shingrix. Education has been provided regarding the importance of this vaccine. Pt has been advised to call insurance company to determine out of pocket expense. Advised may also receive vaccine at local pharmacy or Health Dept. Verbalized acceptance and understanding.  Tdap: up to date   Flu Vaccine: up to date   Pneumococcal Vaccine: up to date   Screening Tests Health Maintenance  Topic Date Due  . DEXA SCAN  02/01/2017  . INFLUENZA VACCINE  08/29/2018  . MAMMOGRAM  08/11/2020  . COLONOSCOPY  03/20/2023  . TETANUS/TDAP  07/18/2025  . Hepatitis C Screening  Completed  . PNA vac Low Risk Adult  Completed    Cancer Screenings:  Colorectal Screening: Completed 03/19/2013. Repeat every 10 years  Mammogram: Completed 08/12/2018. Repeat every year   Bone Density: Completed 02/02/2015, ordered   Lung Cancer Screening: (Low Dose CT Chest recommended if Age 51-80 years, 30 pack-year currently smoking OR have quit w/in 15years.) does qualify.  Last  completed 07/16/2017   Additional Screening:  Hepatitis C Screening: does qualify; Completed 07/19/2015  Vision Screening: Recommended annual ophthalmology exams for early detection of glaucoma and other disorders of the eye. Is the patient up to date with their annual eye exam?  Yes  Who is the provider or what is the name of the office in which the pt attends annual eye exams? [facility]   Dental Screening: Recommended annual dental exams for proper oral hygiene  Community Resource Referral:  CRR required this visit?  No       Plan:  I have personally reviewed and addressed the Medicare Annual Wellness questionnaire and have noted the following in the patient's chart:  A. Medical and social history B. Use of alcohol, tobacco or illicit drugs  C. Current medications and supplements D. Functional ability and status E.  Nutritional status F.  Physical activity G. Advance directives H. List of other physicians I.  Hospitalizations, surgeries, and ER visits in previous 12 months J.  [place] such as hearing and vision if needed, cognitive and depression L. Referrals and appointments   In addition, I have reviewed and discussed with patient certain preventive protocols, quality metrics, and best practice recommendations. A written personalized care plan for preventive services as well as general preventive health recommendations were provided to patient. Nurse Health Advisor  Signed,    [place], Caro Hight, [place]  [phone]/[id number] Nurse Health Advisor   Nurse Notes: none

## 2018-08-17 NOTE — Assessment & Plan Note (Signed)
The current medical regimen is effective;  continue present plan and medications.  

## 2018-08-17 NOTE — Assessment & Plan Note (Signed)
No symptoms 

## 2018-08-17 NOTE — Patient Instructions (Signed)
[name] , Thank you for taking time to come for your Medicare Wellness Visit. I appreciate your ongoing commitment to your health goals. Please review the following plan we discussed and let me know if I can assist you in the future.   Screening recommendations/referrals: Colonoscopy: completed 2015, due 2025 Mammogram: completed 07/2018 Bone Density: Please call [phone] to schedule Recommended yearly ophthalmology/optometry visit for glaucoma screening and checkup Recommended yearly dental visit for hygiene and checkup  Vaccinations: Influenza vaccine: up to date Pneumococcal vaccine: up to date Tdap vaccine: up to date Shingles vaccine: shingrix eligible, check with your insurance company for coverage    Advanced directives: Please bring a copy of your health care power of attorney and living will to the office at your convenience.  Conditions/risks identified: none  Next appointment: follow up in one year for your annual wellness exam.    Preventive Care 65 Years and Older, Female Preventive care refers to lifestyle choices and visits with your health care provider that can promote health and wellness. What does preventive care include?  A yearly physical exam. This is also called an annual well check.  Dental exams once or twice a year.  Routine eye exams. Ask your health care provider how often you should have your eyes checked.  Personal lifestyle choices, including:  Daily care of your teeth and gums.  Regular physical activity.  Eating a healthy diet.  Avoiding tobacco and drug use.  Limiting alcohol use.  Practicing safe sex.  Taking low-dose aspirin every day.  Taking vitamin and mineral supplements as recommended by your health care provider. What happens during an annual well check? The services and screenings done by your health care provider during your annual well check will depend on your age, overall health, lifestyle risk factors, and  family history of disease. Counseling  Your health care provider may ask you questions about your:  Alcohol use.  Tobacco use.  Drug use.  Emotional well-being.  Home and relationship well-being.  Sexual activity.  Eating habits.  History of falls.  Memory and ability to understand (cognition).  Work and work Statistician.  Reproductive health. Screening  You may have the following tests or measurements:  Height, weight, and BMI.  Blood pressure.  Lipid and cholesterol levels. These may be checked every 5 years, or more frequently if you are over 64 years old.  Skin check.  Lung cancer screening. You may have this screening every year starting at age 40 if you have a 30-pack-year history of smoking and currently smoke or have quit within the past 15 years.  Fecal occult blood test (FOBT) of the stool. You may have this test every year starting at age 30.  Flexible sigmoidoscopy or colonoscopy. You may have a sigmoidoscopy every 5 years or a colonoscopy every 10 years starting at age 34.  Hepatitis C blood test.  Hepatitis B blood test.  Sexually transmitted disease (STD) testing.  Diabetes screening. This is done by checking your blood sugar (glucose) after you have not eaten for a while (fasting). You may have this done every 1-3 years.  Bone density scan. This is done to screen for osteoporosis. You may have this done starting at age 79.  Mammogram. This may be done every 1-2 years. Talk to your health care provider about how often you should have regular mammograms. Talk with your health care provider about your test results, treatment options, and if necessary, the need for more tests. Vaccines  Your health  care provider may recommend certain vaccines, such as:  Influenza vaccine. This is recommended every year.  Tetanus, diphtheria, and acellular pertussis (Tdap, Td) vaccine. You may need a Td booster every 10 years.  Zoster vaccine. You may need this  after age 57.  Pneumococcal 13-valent conjugate (PCV13) vaccine. One dose is recommended after age 45.  Pneumococcal polysaccharide (PPSV23) vaccine. One dose is recommended after age 40. Talk to your health care provider about which screenings and vaccines you need and how often you need them. This information is not intended to replace advice given to you by your health care provider. Make sure you discuss any questions you have with your health care provider. Document Released: 02/10/2015 Document Revised: 10/04/2015 Document Reviewed: 11/15/2014 Elsevier Interactive Patient Education  2017 [place] Prevention in the Home Falls can cause injuries. They can happen to people of all ages. There are many things you can do to make your home safe and to help prevent falls. What can I do on the outside of my home?  Regularly fix the edges of walkways and driveways and fix any cracks.  Remove anything that might make you trip as you walk through a door, such as a raised step or threshold.  Trim any bushes or trees on the path to your home.  Use bright outdoor lighting.  Clear any walking paths of anything that might make someone trip, such as rocks or tools.  Regularly check to see if handrails are loose or broken. Make sure that both sides of any steps have handrails.  Any raised decks and porches should have guardrails on the edges.  Have any leaves, snow, or ice cleared regularly.  Use sand or salt on walking paths during winter.  Clean up any spills in your garage right away. This includes oil or grease spills. What can I do in the bathroom?  Use night lights.  Install grab bars by the toilet and in the tub and shower. Do not use towel bars as grab bars.  Use non-skid mats or decals in the tub or shower.  If you need to sit down in the shower, use a plastic, non-slip stool.  Keep the floor dry. Clean up any water that spills on the floor as soon as it happens.   Remove soap buildup in the tub or shower regularly.  Attach bath mats securely with double-sided non-slip rug tape.  Do not have throw rugs and other things on the floor that can make you trip. What can I do in the bedroom?  Use night lights.  Make sure that you have a light by your bed that is easy to reach.  Do not use any sheets or blankets that are too big for your bed. They should not hang down onto the floor.  Have a firm chair that has side arms. You can use this for support while you get dressed.  Do not have throw rugs and other things on the floor that can make you trip. What can I do in the kitchen?  Clean up any spills right away.  Avoid walking on wet floors.  Keep items that you use a lot in easy-to-reach places.  If you need to reach something above you, use a strong step stool that has a grab bar.  Keep electrical cords out of the way.  Do not use floor polish or wax that makes floors slippery. If you must use wax, use non-skid floor wax.  Do not have  throw rugs and other things on the floor that can make you trip. What can I do with my stairs?  Do not leave any items on the stairs.  Make sure that there are handrails on both sides of the stairs and use them. Fix handrails that are broken or loose. Make sure that handrails are as long as the stairways.  Check any carpeting to make sure that it is firmly attached to the stairs. Fix any carpet that is loose or worn.  Avoid having throw rugs at the top or bottom of the stairs. If you do have throw rugs, attach them to the floor with carpet tape.  Make sure that you have a light switch at the top of the stairs and the bottom of the stairs. If you do not have them, ask someone to add them for you. What else can I do to help prevent falls?  Wear shoes that:  Do not have high heels.  Have rubber bottoms.  Are comfortable and fit you well.  Are closed at the toe. Do not wear sandals.  If you use a  stepladder:  Make sure that it is fully opened. Do not climb a closed stepladder.  Make sure that both sides of the stepladder are locked into place.  Ask someone to hold it for you, if possible.  Clearly mark and make sure that you can see:  Any grab bars or handrails.  First and last steps.  Where the edge of each step is.  Use tools that help you move around (mobility aids) if they are needed. These include:  Canes.  Walkers.  Scooters.  Crutches.  Turn on the lights when you go into a dark area. Replace any light bulbs as soon as they burn out.  Set up your furniture so you have a clear path. Avoid moving your furniture around.  If any of your floors are uneven, fix them.  If there are any pets around you, be aware of where they are.  Review your medicines with your doctor. Some medicines can make you feel dizzy. This can increase your chance of falling. Ask your doctor what other things that you can do to help prevent falls. This information is not intended to replace advice given to you by your health care provider. Make sure you discuss any questions you have with your health care provider. Document Released: 11/10/2008 Document Revised: 06/22/2015 Document Reviewed: 02/18/2014 Elsevier Interactive Patient Education  2017 [employer].

## 2018-08-18 ENCOUNTER — Telehealth: Payer: Self-pay | Admitting: *Deleted

## 2018-08-18 NOTE — Telephone Encounter (Signed)
Left message for patient to notify them that it is time to schedule annual low dose lung cancer screening CT scan. Instructed patient to call back to verify information prior to the scan being scheduled.  

## 2018-09-30 ENCOUNTER — Ambulatory Visit
Admission: RE | Admit: 2018-09-30 | Discharge: 2018-09-30 | Disposition: A | Discharge status: Home / Self Care | Payer: Medicare Other | Source: Ambulatory Visit | Attending: Family Medicine | Admitting: Family Medicine

## 2018-09-30 ENCOUNTER — Other Ambulatory Visit: Payer: Self-pay

## 2018-09-30 DIAGNOSIS — Z780 Asymptomatic menopausal state: Secondary | ICD-10-CM | POA: Insufficient documentation

## 2018-10-05 ENCOUNTER — Other Ambulatory Visit: Payer: Self-pay | Admitting: Family Medicine

## 2018-10-05 MED ORDER — ALENDRONATE SODIUM 70 MG PO TABS: 70.0000 mg | ORAL_TABLET | ORAL | 11 refills | Status: DC

## 2018-10-07 DIAGNOSIS — H25013 Cortical age-related cataract, bilateral: Secondary | ICD-10-CM | POA: Diagnosis not present

## 2018-10-20 ENCOUNTER — Telehealth: Payer: Self-pay | Admitting: *Deleted

## 2018-10-20 NOTE — Telephone Encounter (Signed)
Left message for patient to notify them that it is time to schedule annual low dose lung cancer screening CT scan. Instructed patient to call back to verify information prior to the scan being scheduled.  

## 2018-10-21 ENCOUNTER — Encounter: Payer: Self-pay | Admitting: Family Medicine

## 2018-11-03 ENCOUNTER — Encounter: Payer: Self-pay | Admitting: *Deleted

## 2018-11-04 DIAGNOSIS — Z23 Encounter for immunization: Secondary | ICD-10-CM | POA: Diagnosis not present

## 2018-12-22 ENCOUNTER — Telehealth: Payer: Self-pay | Admitting: Family Medicine

## 2018-12-22 NOTE — Telephone Encounter (Signed)
That's fine

## 2018-12-22 NOTE — Telephone Encounter (Signed)
Copied from [place] [phone]. Topic: General - Inquiry >> Dec 11, 2018  3:34 PM Casey Summers, [place] wrote: Reason for CRM: Patient called in stating she would like to be seen by Dr.Jacquie Lukes as new PCP. Has seen her before and pt would feel comfortable if she could continue care with her. Please advise.

## 2019-02-22 ENCOUNTER — Encounter: Payer: Self-pay | Admitting: Family Medicine

## 2019-02-22 ENCOUNTER — Ambulatory Visit: Payer: Self-pay | Admitting: Family Medicine

## 2019-02-22 ENCOUNTER — Other Ambulatory Visit: Payer: Self-pay

## 2019-02-22 ENCOUNTER — Ambulatory Visit (INDEPENDENT_AMBULATORY_CARE_PROVIDER_SITE_OTHER): Payer: Medicare Other | Admitting: Family Medicine

## 2019-02-22 DIAGNOSIS — Z79899 Other long term (current) drug therapy: Secondary | ICD-10-CM | POA: Insufficient documentation

## 2019-02-22 DIAGNOSIS — G8929 Other chronic pain: Secondary | ICD-10-CM | POA: Diagnosis not present

## 2019-02-22 DIAGNOSIS — I10 Essential (primary) hypertension: Secondary | ICD-10-CM

## 2019-02-22 DIAGNOSIS — M5442 Lumbago with sciatica, left side: Secondary | ICD-10-CM | POA: Diagnosis not present

## 2019-02-22 DIAGNOSIS — F3342 Major depressive disorder, recurrent, in full remission: Secondary | ICD-10-CM | POA: Diagnosis not present

## 2019-02-22 MED ORDER — BENAZEPRIL HCL 40 MG PO TABS: 40.0000 mg | ORAL_TABLET | Freq: Every day | ORAL | 1 refills | Status: DC

## 2019-02-22 MED ORDER — ALENDRONATE SODIUM 70 MG PO TABS: 70.0000 mg | ORAL_TABLET | ORAL | 11 refills | Status: DC

## 2019-02-22 MED ORDER — TRAMADOL HCL 50 MG PO TABS: 25.0000 mg | ORAL_TABLET | Freq: Every day | ORAL | 2 refills | Status: DC | PRN

## 2019-02-22 MED ORDER — METOPROLOL SUCCINATE ER 25 MG PO TB24: 25.0000 mg | ORAL_TABLET | Freq: Every day | ORAL | 1 refills | Status: DC

## 2019-02-22 MED ORDER — SERTRALINE HCL 50 MG PO TABS: 50.0000 mg | ORAL_TABLET | Freq: Every day | ORAL | 1 refills | Status: DC

## 2019-02-22 MED ORDER — OMEPRAZOLE 20 MG PO CPDR: 20.0000 mg | DELAYED_RELEASE_CAPSULE | Freq: Every day | ORAL | 3 refills | Status: DC

## 2019-02-22 NOTE — Assessment & Plan Note (Signed)
02/22/19 for Tramadol- see signed documents

## 2019-02-22 NOTE — Progress Notes (Signed)
BP 127/79   Pulse 70   Temp 98.2 F (36.8 C) (Oral)   Ht 5' (1.524 m)   Wt 116 lb (52.6 kg)   SpO2 99%   BMI 22.65 kg/m    Subjective:    Patient ID: Casey Summers, female    DOB: 07/16/1945, 74 y.o.   MRN: [id number]  HPI: Casey Summers is a 74 y.o. female  Chief Complaint  Patient presents with  . Hypertension  . Depression   HYPERTENSION Hypertension status: controlled  Satisfied with current treatment? yes Duration of hypertension: chronic BP monitoring frequency:  not checking BP medication side effects:  no Medication compliance: excellent compliance Previous BP meds: benazepril, metoprolol Aspirin: yes Recurrent headaches: no Visual changes: no Palpitations: no Dyspnea: no Chest pain: no Lower extremity edema: no Dizzy/lightheaded: no  DEPRESSION Mood status: controlled Satisfied with current treatment?: yes Symptom severity: mild  Duration of current treatment : chronic Side effects: no Medication compliance: excellent compliance Psychotherapy/counseling: no  Previous psychiatric medications: sertraline Depressed mood: no Anxious mood: no Anhedonia: no Significant weight loss or gain: no Insomnia: no  Fatigue: yes Feelings of worthlessness or guilt: no Impaired concentration/indecisiveness: no Suicidal ideations: no Hopelessness: no Crying spells: no Depression screen [facility] 2/9 02/22/2019 08/17/2018 09/09/2017 06/30/2017 06/30/2017  Decreased Interest 1 0 0 0 0  Down, Depressed, Hopeless 2 0 0 0 0  PHQ - 2 Score 3 0 0 0 0  Altered sleeping 1 - 0 0 -  Tired, decreased energy 1 - 0 0 -  Change in appetite 2 - 0 0 -  Feeling bad or failure about yourself  0 - 0 0 -  Trouble concentrating 0 - 0 0 -  Moving slowly or fidgety/restless 0 - 0 0 -  Suicidal thoughts 0 - 0 0 -  PHQ-9 Score 7 - 0 0 -  Difficult doing work/chores Somewhat difficult - - - -   CHRONIC PAIN- taking 1/2 to 1 tab a day  Present dose: 20 Morphine equivalents Pain  control status: controlled Duration: chronic Location: low back Quality: sharp Current Pain Level: moderate Previous Pain Level: moderate Breakthrough pain: no Benefit from narcotic medications: yes What Activities task can be accomplished with current medication? Interested in weaning off narcotics:no   Stool softners/OTC fiber: no  Previous pain specialty evaluation: no Non-narcotic analgesic meds: no Narcotic contract: yes  Relevant past medical, surgical, family and social history reviewed and updated as indicated. Interim medical history since our last visit reviewed. Allergies and medications reviewed and updated.  Review of Systems  Constitutional: Negative.   Respiratory: Negative.   Cardiovascular: Negative.   Gastrointestinal: Negative.   Musculoskeletal: Positive for back pain and myalgias. Negative for arthralgias, gait problem, joint swelling, neck pain and neck stiffness.  Skin: Negative.   Neurological: Negative.   Psychiatric/Behavioral: Negative.     Per HPI unless specifically indicated above     Objective:    BP 127/79   Pulse 70   Temp 98.2 F (36.8 C) (Oral)   Ht 5' (1.524 m)   Wt 116 lb (52.6 kg)   SpO2 99%   BMI 22.65 kg/m   Wt Readings from Last 3 Encounters:  02/22/19 116 lb (52.6 kg)  08/17/18 114 lb (51.7 kg)  08/17/18 114 lb (51.7 kg)    Physical Exam Vitals and nursing note reviewed.  Constitutional:      General: She is not in acute distress.    Appearance: Normal appearance. She is  not ill-appearing, toxic-appearing or diaphoretic.  HENT:     Head: Normocephalic and atraumatic.     Right Ear: External ear normal.     Left Ear: External ear normal.     Nose: Nose normal.     Mouth/Throat:     Mouth: Mucous membranes are moist.     Pharynx: Oropharynx is clear.  Eyes:     General: No scleral icterus.       Right eye: No discharge.        Left eye: No discharge.     Extraocular Movements: Extraocular movements intact.      Conjunctiva/sclera: Conjunctivae normal.     Pupils: Pupils are equal, round, and reactive to light.  Cardiovascular:     Rate and Rhythm: Normal rate and regular rhythm.     Pulses: Normal pulses.     Heart sounds: Normal heart sounds. No murmur. No friction rub. No gallop.   Pulmonary:     Effort: Pulmonary effort is normal. No respiratory distress.     Breath sounds: Normal breath sounds. No stridor. No wheezing, rhonchi or rales.  Chest:     Chest wall: No tenderness.  Musculoskeletal:        General: Normal range of motion.     Cervical back: Normal range of motion and neck supple.  Skin:    General: Skin is warm and dry.     Capillary Refill: Capillary refill takes less than 2 seconds.     Coloration: Skin is not jaundiced or pale.     Findings: No bruising, erythema, lesion or rash.  Neurological:     General: No focal deficit present.     Mental Status: She is alert and oriented to person, place, and time. Mental status is at baseline.  Psychiatric:        Mood and Affect: Mood normal.        Behavior: Behavior normal.        Thought Content: Thought content normal.        Judgment: Judgment normal.     Results for orders placed or performed in visit on 08/13/18  HM MAMMOGRAPHY  Result Value Ref Range   HM Mammogram 0-4 Bi-Rad 0-4 Bi-Rad, Self Reported Normal      Assessment & Plan:   Problem List Items Addressed This Visit      Cardiovascular and Mediastinum   Hypertension    Under good control on current regimen. Continue current regimen. Continue to monitor. Call with any concerns. Refills given.        Relevant Medications   metoprolol succinate (TOPROL-XL) 25 MG 24 hr tablet   benazepril (LOTENSIN) 40 MG tablet   Other Relevant Orders   Basic metabolic panel     Other   Low back pain    Stable on low-dose tramadol. Will continue it. Controlled substance agreement signed today. Utox done today. 3 month supply given. Will need to be seen every 3  months. Call with any concerns.       Relevant Medications   traMADol (ULTRAM) 50 MG tablet   Other Relevant Orders   [id number] 11+Oxyco+Alc+Crt-Bund   Depression    Under good control on current regimen. Continue current regimen. Continue to monitor. Call with any concerns. Refills given.        Relevant Medications   sertraline (ZOLOFT) 50 MG tablet   Controlled substance agreement signed    02/22/19 for Tramadol- see signed documents  Follow up plan: Return in about 3 months (around 05/23/2019) for chronic pain.

## 2019-02-22 NOTE — Assessment & Plan Note (Signed)
Stable on low-dose tramadol. Will continue it. Controlled substance agreement signed today. Utox done today. 3 month supply given. Will need to be seen every 3 months. Call with any concerns.

## 2019-02-22 NOTE — Assessment & Plan Note (Signed)
Under good control on current regimen. Continue current regimen. Continue to monitor. Call with any concerns. Refills given.   

## 2019-02-23 ENCOUNTER — Encounter: Payer: Self-pay | Admitting: Family Medicine

## 2019-02-23 LAB — BASIC METABOLIC PANEL
BUN/Creatinine Ratio: 16 (ref 12–28)
BUN: 11 mg/dL (ref 8–27)
CO2: 23 mmol/L (ref 20–29)
Calcium: 9.4 mg/dL (ref 8.7–10.3)
Chloride: 101 mmol/L (ref 96–106)
Creatinine, Ser: 0.69 mg/dL (ref 0.57–1.00)
GFR calc Af Amer: 101 mL/min/{1.73_m2} (ref >59)
GFR calc non Af Amer: 88 mL/min/{1.73_m2} (ref >59)
Glucose: 83 mg/dL (ref 65–99)
Potassium: 5 mmol/L (ref 3.5–5.2)
Sodium: 139 mmol/L (ref 134–144)

## 2019-02-24 LAB — DRUG SCREEN 764883 11+OXYCO+ALC+CRT-BUND
Amphetamines, Urine: NEGATIVE ng/mL
BENZODIAZ UR QL: NEGATIVE ng/mL
Barbiturate: NEGATIVE ng/mL
Cannabinoid Quant, Ur: NEGATIVE ng/mL
Cocaine (Metabolite): NEGATIVE ng/mL
Creatinine: 116.9 mg/dL (ref 20.0–300.0)
Ethanol: NEGATIVE %
Meperidine: NEGATIVE ng/mL
Methadone Screen, Urine: NEGATIVE ng/mL
OPIATE SCREEN URINE: NEGATIVE ng/mL
Oxycodone/Oxymorphone, Urine: NEGATIVE ng/mL
Phencyclidine: NEGATIVE ng/mL
Propoxyphene: NEGATIVE ng/mL
pH, Urine: 6.4 (ref 4.5–8.9)

## 2019-02-24 LAB — TRAMADOL GC/MS, URINE
Tramadol gc/ms Conf: 665 ng/mL
Tramadol: POSITIVE — AB

## 2019-03-19 ENCOUNTER — Other Ambulatory Visit: Payer: Self-pay | Admitting: Family Medicine

## 2019-04-17 DIAGNOSIS — L02212 Cutaneous abscess of back [any part, except buttock]: Secondary | ICD-10-CM | POA: Diagnosis not present

## 2019-04-19 DIAGNOSIS — Z4800 Encounter for change or removal of nonsurgical wound dressing: Secondary | ICD-10-CM | POA: Diagnosis not present

## 2019-04-23 ENCOUNTER — Ambulatory Visit: Payer: Self-pay | Admitting: *Deleted

## 2019-04-23 NOTE — Telephone Encounter (Signed)
Duplicate request; see previous nurse triage note dated 04/23/19.  Reason for Disposition . [1] Follow-up call to recent contact AND [2] information only call, no triage required  Protocols used: INFORMATION ONLY CALL - NO TRIAGE-A-AH

## 2019-04-23 NOTE — Telephone Encounter (Signed)
Per initial encounter, "Pt would like to speak with a nurse about her 2nd covid vaccine. She is in [place] taking care of a family member and trying to get her 2nd vaccine while there but she was advised that it is too late. She had her 1st vaccine on Feb 2nd/ please advise"; pt advised per cdc guidelines, "vaccines administered beyond this window. If the second dose is administered beyond these intervals, there is no need to restart the series." ; she verbalized understanding, and will reschedule 2nd dose when she returns.  Reason for Disposition . General information question, no triage required and triager able to answer question  Answer Assessment - Initial Assessment Questions 1. REASON FOR CALL or QUESTION: "What is your reason for calling today?" or "How can I best help you?" or "What question do you have that I can help answer?"     COVID vaccine 2nd dose rescheduling  Protocols used: INFORMATION ONLY CALL - NO TRIAGE-A-AH

## 2019-05-25 ENCOUNTER — Ambulatory Visit (INDEPENDENT_AMBULATORY_CARE_PROVIDER_SITE_OTHER): Payer: Medicare Other | Admitting: Family Medicine

## 2019-05-25 ENCOUNTER — Other Ambulatory Visit: Payer: Self-pay

## 2019-05-25 ENCOUNTER — Encounter: Payer: Self-pay | Admitting: Family Medicine

## 2019-05-25 VITALS — BP 129/75 | HR 79 | Temp 97.8°F | Wt 113.4 lb

## 2019-05-25 DIAGNOSIS — M5442 Lumbago with sciatica, left side: Secondary | ICD-10-CM | POA: Diagnosis not present

## 2019-05-25 DIAGNOSIS — Z1231 Encounter for screening mammogram for malignant neoplasm of breast: Secondary | ICD-10-CM

## 2019-05-25 DIAGNOSIS — G8929 Other chronic pain: Secondary | ICD-10-CM | POA: Diagnosis not present

## 2019-05-25 MED ORDER — OMEPRAZOLE 20 MG PO CPDR: 20.0000 mg | DELAYED_RELEASE_CAPSULE | Freq: Every day | ORAL | 3 refills | Status: DC

## 2019-05-25 MED ORDER — TRAMADOL HCL 50 MG PO TABS: 25.0000 mg | ORAL_TABLET | Freq: Every day | ORAL | 2 refills | Status: DC | PRN

## 2019-05-25 NOTE — Progress Notes (Signed)
BP 129/75 (BP Location: Left Arm, Patient Position: Sitting, Cuff Size: Normal)   Pulse 79   Temp 97.8 F (36.6 C) (Oral)   Wt 113 lb 6.4 oz (51.4 kg)   SpO2 96%   BMI 22.15 kg/m    Subjective:    Patient ID: Casey Summers, female    DOB: 31-Mar-1945, 74 y.o.   MRN: [id number]  HPI: Casey Summers is a 74 y.o. female  Chief Complaint  Patient presents with  . Pain   CHRONIC PAIN  Present dose: 2.5-5 Morphine equivalents Pain control status: controlled Duration: chronic Location: L sided low back pain down L leg Quality: sharp Current Pain Level: moderate Previous Pain Level: moderate Breakthrough pain: no Benefit from narcotic medications: yes What Activities task can be accomplished with current medication? Able to paint Interested in weaning off narcotics:no   Stool softners/OTC fiber: no  Previous pain specialty evaluation: no Non-narcotic analgesic meds: no Narcotic contract: yes  Relevant past medical, surgical, family and social history reviewed and updated as indicated. Interim medical history since our last visit reviewed. Allergies and medications reviewed and updated.  Review of Systems  Constitutional: Negative.   Respiratory: Negative.   Cardiovascular: Negative.   Gastrointestinal: Negative.   Musculoskeletal: Positive for arthralgias, back pain and myalgias. Negative for gait problem, joint swelling, neck pain and neck stiffness.  Skin: Negative.   Psychiatric/Behavioral: Negative.     Per HPI unless specifically indicated above     Objective:    BP 129/75 (BP Location: Left Arm, Patient Position: Sitting, Cuff Size: Normal)   Pulse 79   Temp 97.8 F (36.6 C) (Oral)   Wt 113 lb 6.4 oz (51.4 kg)   SpO2 96%   BMI 22.15 kg/m   Wt Readings from Last 3 Encounters:  05/25/19 113 lb 6.4 oz (51.4 kg)  02/22/19 116 lb (52.6 kg)  08/17/18 114 lb (51.7 kg)    Physical Exam Vitals and nursing note reviewed.  Constitutional:    General: She is not in acute distress.    Appearance: Normal appearance. She is not ill-appearing, toxic-appearing or diaphoretic.  HENT:     Head: Normocephalic and atraumatic.     Right Ear: External ear normal.     Left Ear: External ear normal.     Nose: Nose normal.     Mouth/Throat:     Mouth: Mucous membranes are moist.     Pharynx: Oropharynx is clear.  Eyes:     General: No scleral icterus.       Right eye: No discharge.        Left eye: No discharge.     Extraocular Movements: Extraocular movements intact.     Conjunctiva/sclera: Conjunctivae normal.     Pupils: Pupils are equal, round, and reactive to light.  Cardiovascular:     Rate and Rhythm: Normal rate and regular rhythm.     Pulses: Normal pulses.     Heart sounds: Normal heart sounds. No murmur. No friction rub. No gallop.   Pulmonary:     Effort: Pulmonary effort is normal. No respiratory distress.     Breath sounds: Normal breath sounds. No stridor. No wheezing, rhonchi or rales.  Chest:     Chest wall: No tenderness.  Musculoskeletal:        General: Normal range of motion.     Cervical back: Normal range of motion and neck supple.  Skin:    General: Skin is warm and dry.  Capillary Refill: Capillary refill takes less than 2 seconds.     Coloration: Skin is not jaundiced or pale.     Findings: No bruising, erythema, lesion or rash.  Neurological:     General: No focal deficit present.     Mental Status: She is alert and oriented to person, place, and time. Mental status is at baseline.  Psychiatric:        Mood and Affect: Mood normal.        Behavior: Behavior normal.        Thought Content: Thought content normal.        Judgment: Judgment normal.     Results for orders placed or performed in visit on [id number]  Basic metabolic panel  Result Value Ref Range   Glucose 83 65 - 99 mg/dL   BUN 11 8 - 27 mg/dL   Creatinine, Ser 0.69 0.57 - 1.00 mg/dL   GFR calc non Af Amer 88 >59 mL/min/1.73    GFR calc Af Amer 101 >59 mL/min/1.73   BUN/Creatinine Ratio 16 12 - 28   Sodium 139 134 - 144 mmol/L   Potassium 5.0 3.5 - 5.2 mmol/L   Chloride 101 96 - 106 mmol/L   CO2 23 20 - 29 mmol/L   Calcium 9.4 8.7 - 10.3 mg/dL  [id number] 11+Oxyco+Alc+Crt-Bund  Result Value Ref Range   Ethanol Negative Cutoff=0.020 %   Amphetamines, Urine Negative Cutoff=1000 ng/mL   Barbiturate Negative Cutoff=200 ng/mL   BENZODIAZ UR QL Negative Cutoff=200 ng/mL   Cannabinoid Quant, Ur Negative Cutoff=50 ng/mL   Cocaine (Metabolite) Negative Cutoff=300 ng/mL   OPIATE SCREEN URINE Negative Cutoff=300 ng/mL   Oxycodone/Oxymorphone, Urine Negative Cutoff=300 ng/mL   Phencyclidine Negative Cutoff=25 ng/mL   Methadone Screen, Urine Negative Cutoff=300 ng/mL   Propoxyphene Negative Cutoff=300 ng/mL   Meperidine Negative Cutoff=200 ng/mL   Tramadol See Final Results Cutoff=200 ng/mL   Creatinine 116.9 20.0 - 300.0 mg/dL   pH, Urine 6.4 4.5 - 8.9  Tramadol Gc/Ms, Urine  Result Value Ref Range   Tramadol Positive (A) Cutoff=200   Tramadol gc/ms Conf 665 Cutoff=100 ng/mL      Assessment & Plan:   Problem List Items Addressed This Visit      Other   Low back pain - Primary    Stable on low dose tramadol. Refills for 3 months given today. Call with any concerns. Continue to monitor. Call with any concerns. Recheck 3 months.       Relevant Medications   traMADol (ULTRAM) 50 MG tablet    Other Visit Diagnoses    Encounter for screening mammogram for malignant neoplasm of breast       Mammogram ordered today.   Relevant Orders   MM 3D SCREEN BREAST BILATERAL       Follow up plan: Return in about 3 months (around 08/23/2019) for wellness and .

## 2019-05-25 NOTE — Assessment & Plan Note (Signed)
Stable on low dose tramadol. Refills for 3 months given today. Call with any concerns. Continue to monitor. Call with any concerns. Recheck 3 months.

## 2019-06-08 DIAGNOSIS — Z23 Encounter for immunization: Secondary | ICD-10-CM | POA: Diagnosis not present

## 2019-08-20 DIAGNOSIS — Z1231 Encounter for screening mammogram for malignant neoplasm of breast: Secondary | ICD-10-CM | POA: Diagnosis not present

## 2019-08-20 LAB — HM MAMMOGRAPHY

## 2019-08-23 ENCOUNTER — Ambulatory Visit (INDEPENDENT_AMBULATORY_CARE_PROVIDER_SITE_OTHER): Payer: Medicare Other

## 2019-08-23 VITALS — Ht 60.0 in | Wt 113.0 lb

## 2019-08-23 DIAGNOSIS — Z0000 Encounter for general adult medical examination without abnormal findings: Secondary | ICD-10-CM | POA: Diagnosis not present

## 2019-08-23 NOTE — Progress Notes (Signed)
I connected with Casey Summers today by telephone and verified that I am speaking with the correct person using two identifiers. Location patient: home Location provider: work Persons participating in the virtual visit: Casey Summers, Glenna Durand LPN.   I discussed the limitations, risks, security and privacy concerns of performing an evaluation and management service by telephone and the availability of in person appointments. I also discussed with the patient that there may be a patient responsible charge related to this service. The patient expressed understanding and verbally consented to this telephonic visit.    Interactive audio and video telecommunications were attempted between this provider and patient, however failed, due to patient having technical difficulties OR patient did not have access to video capability.  We continued and completed visit with audio only.    Vital signs may be patient reported or missing.   Subjective:   Casey Summers is a 74 y.o. female who presents for Medicare Annual (Subsequent) preventive examination.  Review of Systems     Cardiac Risk Factors include: advanced age (>65men, >65 women);hypertension;sedentary lifestyle     Objective:    Today's Vitals   08/23/19 1113  Weight: 113 lb (51.3 kg)  Height: 5' (1.524 m)   Body mass index is 22.07 kg/m.  Advanced Directives 08/23/2019 08/17/2018 06/30/2017  Does Patient Have a Medical Advance Directive? No Yes No  Type of Advance Directive - Living will;Healthcare Power of Attorney -  [place] in Chart? - No - copy requested -  Would patient like information on creating a medical advance directive? - - Yes (MAU/Ambulatory/Procedural Areas - Information given)    Current Medications (verified) Outpatient Encounter Medications as of 08/23/2019  Medication Sig  . alendronate (FOSAMAX) 70 MG tablet Take 1 tablet (70 mg total) by mouth every 7 (seven) days.  Take with a full glass of water on an empty stomach.  Marland Kitchen aspirin 81 MG tablet Take 81 mg by mouth.  . benazepril (LOTENSIN) 40 MG tablet Take 1 tablet (40 mg total) by mouth daily.  . Cholecalciferol (VITAMIN D-1000 MAX ST) 1000 UNITS tablet Take by mouth.  . metoprolol succinate (TOPROL-XL) 25 MG 24 hr tablet Take 1 tablet (25 mg total) by mouth daily.  Marland Kitchen omeprazole (PRILOSEC) 20 MG capsule Take 1 capsule (20 mg total) by mouth daily.  . sertraline (ZOLOFT) 50 MG tablet Take 1 tablet (50 mg total) by mouth daily.  . traMADol (ULTRAM) 50 MG tablet Take 0.5-1 tablets (25-50 mg total) by mouth daily as needed.  . Multiple Vitamin (MULTIVITAMIN) tablet Take 1 tablet by mouth daily. (Patient not taking: Reported on 08/23/2019)   No facility-administered encounter medications on file as of 08/23/2019.    Allergies (verified) Iron metal [iron]   History: Past Medical History:  Diagnosis Date  . Allergy   . Cancer (HCC)    breast  . Chronic mid back pain   . Depression   . Hypertension   . Osteoporosis    Past Surgical History:  Procedure Laterality Date  . ABDOMINAL HYSTERECTOMY    . BREAST LUMPECTOMY  2010   Family History  Problem Relation Age of Onset  . Congestive Heart Failure Mother   . Cancer Sister        breast   Social History   Socioeconomic History  . Marital status: Divorced    Spouse name: Not on file  . Number of children: Not on file  . Years of education: Not on file  .  Highest education level: Associate degree: academic program  Occupational History  . Occupation: retired  Tobacco Use  . Smoking status: Former Smoker    Packs/day: 1.25    Years: 24.00    Pack years: 30.00    Types: Cigarettes    Quit date: 08/28/2008    Years since quitting: 10.9  . Smokeless tobacco: Never Used  Vaping Use  . Vaping Use: Never used  Substance and Sexual Activity  . Alcohol use: Yes    Alcohol/week: 14.0 standard drinks    Types: 14 Glasses of wine per week  .  Drug use: No  . Sexual activity: Not on file  Other Topics Concern  . Not on file  Social History Narrative  . Not on file   Social Determinants of Health   Financial Resource Strain: Low Risk   . Difficulty of Paying Living Expenses: Not hard at all  Food Insecurity: No Food Insecurity  . Worried About Charity fundraiser in the Last Year: Never true  . Ran Out of Food in the Last Year: Never true  Transportation Needs: No Transportation Needs  . Lack of Transportation (Medical): No  . Lack of Transportation (Non-Medical): No  Physical Activity: Inactive  . Days of Exercise per Week: 0 days  . Minutes of Exercise per Session: 0 min  Stress: No Stress Concern Present  . Feeling of Stress : Not at all  Social Connections:   . Frequency of Communication with Friends and Family:   . Frequency of Social Gatherings with Friends and Family:   . Attends Religious Services:   . Active Member of Clubs or Organizations:   . Attends Archivist Meetings:   Marland Kitchen Marital Status:     Tobacco Counseling Counseling given: Not Answered   Clinical Intake:  Pre-visit preparation completed: Yes  Pain : No/denies pain     Nutritional Status: BMI of 19-24  Normal Nutritional Risks: Nausea/ vomitting/ diarrhea (diarrhea on occassion) Diabetes: No  How often do you need to have someone help you when you read instructions, pamphlets, or other written materials from your doctor or pharmacy?: 1 - Never What is the last grade level you completed in school?: associate's degree  Diabetic? no  Interpreter Needed?: No  Information entered by :: NAllen LPN   Activities of Daily Living In your present state of health, do you have any difficulty performing the following activities: 08/23/2019  Hearing? N  Comment with a crowd  Vision? N  Difficulty concentrating or making decisions? N  Walking or climbing stairs? N  Dressing or bathing? N  Doing errands, shopping? N  Preparing  Food and eating ? N  Using the Toilet? N  In the past six months, have you accidently leaked urine? Y  Comment wears a pad  Do you have problems with loss of bowel control? N  Managing your Medications? N  Managing your Finances? N  Housekeeping or managing your Housekeeping? N  Some recent data might be hidden    Patient Care Team: Valerie Roys, DO as PCP - General (Family Medicine) Ardell Isaacs, NP as Nurse Practitioner (Surgical Oncology)  Indicate any recent Medical Services you may have received from other than [facility] providers in the past year (date may be approximate).     Assessment:   This is a routine wellness examination for Casey Summers.  Hearing/Vision screen  Hearing Screening   125Hz  250Hz  500Hz  1000Hz  2000Hz  3000Hz  4000Hz  6000Hz  8000Hz   Right ear:  Left ear:           Vision Screening Comments: Regular eye exams, Dr. Wyatt Portela  Dietary issues and exercise activities discussed: Current Exercise Habits: The patient does not participate in regular exercise at present  Goals    . DIET - INCREASE WATER INTAKE     Recommend drinking at least 6-8 glasses of water a day     . Patient Stated     08/23/2019, wants to lose 5 pounds      Depression Screen PHQ 2/9 Scores 08/23/2019 02/22/2019 08/17/2018 09/09/2017 06/30/2017 06/30/2017 05/29/2017  PHQ - 2 Score 0 3 0 0 0 0 0  PHQ- 9 Score 0 7 - 0 0 - -    Fall Risk Fall Risk  08/23/2019 02/22/2019 08/17/2018 02/16/2018 09/09/2017  Falls in the past year? 0 0 0 0 No  Number falls in past yr: - 0 - - -  Injury with Fall? - 0 - - -  Risk for fall due to : Medication side effect - - - -  Follow up Falls evaluation completed;Education provided;Falls prevention discussed - - Falls evaluation completed -    Any stairs in or around the home? Yes  If so, are there any without handrails? Yes  Home free of loose throw rugs in walkways, pet beds, electrical cords, etc? Yes  Adequate lighting in your home to reduce risk of  falls? Yes   ASSISTIVE DEVICES UTILIZED TO PREVENT FALLS:  Life alert? No  Use of a cane, walker or w/c? No  Grab bars in the bathroom? No  Shower chair or bench in shower? No  Elevated toilet seat or a handicapped toilet? No   TIMED UP AND GO:  Was the test performed? No   Cognitive Function:     6CIT Screen 08/23/2019 08/17/2018 06/30/2017  What Year? 0 points 0 points 0 points  What month? 0 points 0 points 0 points  What time? 0 points 0 points 0 points  Count back from 20 0 points 0 points 0 points  Months in reverse 0 points 0 points 0 points  Repeat phrase 4 points - 0 points  Total Score 4 - 0    Immunizations Immunization History  Administered Date(s) Administered  . Influenza, High Dose Seasonal PF 02/07/2016, 10/27/2017, 11/03/2018  . Influenza,inj,Quad PF,6+ Mos 11/28/2014  . PFIZER SARS-COV-2 Vaccination 03/02/2019, 05/14/2019  . Pneumococcal Conjugate-13 05/24/2015  . Pneumococcal Polysaccharide-23 06/30/2017  . Td 01/29/2004  . Tdap 07/19/2015  . Zoster 10/22/2011, 09/07/2012    TDAP status: Up to date Flu Vaccine status: Up to date Pneumococcal vaccine status: Up to date Covid-19 vaccine status: Completed vaccines  Qualifies for Shingles Vaccine? Yes   Zostavax completed Yes   Shingrix Completed?: No.    Education has been provided regarding the importance of this vaccine. Patient has been advised to call insurance company to determine out of pocket expense if they have not yet received this vaccine. Advised may also receive vaccine at local pharmacy or Health Dept. Verbalized acceptance and understanding.  Screening Tests Health Maintenance  Topic Date Due  . INFLUENZA VACCINE  08/29/2019  . MAMMOGRAM  08/19/2020  . DEXA SCAN  09/29/2020  . COLONOSCOPY  03/20/2023  . TETANUS/TDAP  07/18/2025  . COVID-19 Vaccine  Completed  . Hepatitis C Screening  Completed  . PNA vac Low Risk Adult  Completed    Health Maintenance  There are no preventive  care reminders to display for this patient.  Colorectal  cancer screening: Completed 10/06/2013. Repeat every 10 years Mammogram status: Completed 08/20/2019. Repeat every year Bone Density status: Completed 09/30/2018.   Lung Cancer Screening: (Low Dose CT Chest recommended if Age 87-80 years, 30 pack-year currently smoking OR have quit w/in 15years.) does not qualify.   Lung Cancer Screening Referral: no  Additional Screening:  Hepatitis C Screening: does qualify; Completed 07/19/2015  Vision Screening: Recommended annual ophthalmology exams for early detection of glaucoma and other disorders of the eye. Is the patient up to date with their annual eye exam?  No  Who is the provider or what is the name of the office in which the patient attends annual eye exams? Dr. Wyatt Portela If pt is not established with a provider, would they like to be referred to a provider to establish care? No .   Dental Screening: Recommended annual dental exams for proper oral hygiene  Community Resource Referral / Chronic Care Management: CRR required this visit?  No   CCM required this visit?  No      Plan:     I have personally reviewed and noted the following in the patient's chart:   . Medical and social history . Use of alcohol, tobacco or illicit drugs  . Current medications and supplements . Functional ability and status . Nutritional status . Physical activity . Advanced directives . List of other physicians . Hospitalizations, surgeries, and ER visits in previous 12 months . Vitals . Screenings to include cognitive, depression, and falls . Referrals and appointments  In addition, I have reviewed and discussed with patient certain preventive protocols, quality metrics, and best practice recommendations. A written personalized care plan for preventive services as well as general preventive health recommendations were provided to patient.     Kellie Simmering, LPN   [id number]   Nurse Notes:

## 2019-08-23 NOTE — Patient Instructions (Signed)
Casey Summers , Thank you for taking time to come for your Medicare Wellness Visit. I appreciate your ongoing commitment to your health goals. Please review the following plan we discussed and let me know if I can assist you in the future.   Screening recommendations/referrals: Colonoscopy: completed 10/06/2013 Mammogram: completed 08/20/2019 Bone Density: completed 09/30/2018 Recommended yearly ophthalmology/optometry visit for glaucoma screening and checkup Recommended yearly dental visit for hygiene and checkup  Vaccinations: Influenza vaccine: completed 11/04/2018 Pneumococcal vaccine: completed 06/30/2017 Tdap vaccine: completed 07/19/2015 Shingles vaccine: discussed   Covid-19:03/02/2019, 05/14/2019  Advanced directives: Advance directive discussed with you today.   Conditions/risks identified: none  Next appointment: Follow up in one year for your annual wellness visit    Preventive Care 65 Years and Older, Female Preventive care refers to lifestyle choices and visits with your health care provider that can promote health and wellness. What does preventive care include?  A yearly physical exam. This is also called an annual well check.  Dental exams once or twice a year.  Routine eye exams. Ask your health care provider how often you should have your eyes checked.  Personal lifestyle choices, including:  Daily care of your teeth and gums.  Regular physical activity.  Eating a healthy diet.  Avoiding tobacco and drug use.  Limiting alcohol use.  Practicing safe sex.  Taking low-dose aspirin every day.  Taking vitamin and mineral supplements as recommended by your health care provider. What happens during an annual well check? The services and screenings done by your health care provider during your annual well check will depend on your age, overall health, lifestyle risk factors, and family history of disease. Counseling  Your health care provider may ask you questions  about your:  Alcohol use.  Tobacco use.  Drug use.  Emotional well-being.  Home and relationship well-being.  Sexual activity.  Eating habits.  History of falls.  Memory and ability to understand (cognition).  Work and work Statistician.  Reproductive health. Screening  You may have the following tests or measurements:  Height, weight, and BMI.  Blood pressure.  Lipid and cholesterol levels. These may be checked every 5 years, or more frequently if you are over 86 years old.  Skin check.  Lung cancer screening. You may have this screening every year starting at age 58 if you have a 30-pack-year history of smoking and currently smoke or have quit within the past 15 years.  Fecal occult blood test (FOBT) of the stool. You may have this test every year starting at age 69.  Flexible sigmoidoscopy or colonoscopy. You may have a sigmoidoscopy every 5 years or a colonoscopy every 10 years starting at age 13.  Hepatitis C blood test.  Hepatitis B blood test.  Sexually transmitted disease (STD) testing.  Diabetes screening. This is done by checking your blood sugar (glucose) after you have not eaten for a while (fasting). You may have this done every 1-3 years.  Bone density scan. This is done to screen for osteoporosis. You may have this done starting at age 82.  Mammogram. This may be done every 1-2 years. Talk to your health care provider about how often you should have regular mammograms. Talk with your health care provider about your test results, treatment options, and if necessary, the need for more tests. Vaccines  Your health care provider may recommend certain vaccines, such as:  Influenza vaccine. This is recommended every year.  Tetanus, diphtheria, and acellular pertussis (Tdap, Td) vaccine. You may  need a Td booster every 10 years.  Zoster vaccine. You may need this after age 58.  Pneumococcal 13-valent conjugate (PCV13) vaccine. One dose is  recommended after age 49.  Pneumococcal polysaccharide (PPSV23) vaccine. One dose is recommended after age 19. Talk to your health care provider about which screenings and vaccines you need and how often you need them. This information is not intended to replace advice given to you by your health care provider. Make sure you discuss any questions you have with your health care provider. Document Released: 02/10/2015 Document Revised: 10/04/2015 Document Reviewed: 11/15/2014 Elsevier Interactive Patient Education  2017 [place] Prevention in the Home Falls can cause injuries. They can happen to people of all ages. There are many things you can do to make your home safe and to help prevent falls. What can I do on the outside of my home?  Regularly fix the edges of walkways and driveways and fix any cracks.  Remove anything that might make you trip as you walk through a door, such as a raised step or threshold.  Trim any bushes or trees on the path to your home.  Use bright outdoor lighting.  Clear any walking paths of anything that might make someone trip, such as rocks or tools.  Regularly check to see if handrails are loose or broken. Make sure that both sides of any steps have handrails.  Any raised decks and porches should have guardrails on the edges.  Have any leaves, snow, or ice cleared regularly.  Use sand or salt on walking paths during winter.  Clean up any spills in your garage right away. This includes oil or grease spills. What can I do in the bathroom?  Use night lights.  Install grab bars by the toilet and in the tub and shower. Do not use towel bars as grab bars.  Use non-skid mats or decals in the tub or shower.  If you need to sit down in the shower, use a plastic, non-slip stool.  Keep the floor dry. Clean up any water that spills on the floor as soon as it happens.  Remove soap buildup in the tub or shower regularly.  Attach bath mats  securely with double-sided non-slip rug tape.  Do not have throw rugs and other things on the floor that can make you trip. What can I do in the bedroom?  Use night lights.  Make sure that you have a light by your bed that is easy to reach.  Do not use any sheets or blankets that are too big for your bed. They should not hang down onto the floor.  Have a firm chair that has side arms. You can use this for support while you get dressed.  Do not have throw rugs and other things on the floor that can make you trip. What can I do in the kitchen?  Clean up any spills right away.  Avoid walking on wet floors.  Keep items that you use a lot in easy-to-reach places.  If you need to reach something above you, use a strong step stool that has a grab bar.  Keep electrical cords out of the way.  Do not use floor polish or wax that makes floors slippery. If you must use wax, use non-skid floor wax.  Do not have throw rugs and other things on the floor that can make you trip. What can I do with my stairs?  Do not leave any items on  the stairs.  Make sure that there are handrails on both sides of the stairs and use them. Fix handrails that are broken or loose. Make sure that handrails are as long as the stairways.  Check any carpeting to make sure that it is firmly attached to the stairs. Fix any carpet that is loose or worn.  Avoid having throw rugs at the top or bottom of the stairs. If you do have throw rugs, attach them to the floor with carpet tape.  Make sure that you have a light switch at the top of the stairs and the bottom of the stairs. If you do not have them, ask someone to add them for you. What else can I do to help prevent falls?  Wear shoes that:  Do not have high heels.  Have rubber bottoms.  Are comfortable and fit you well.  Are closed at the toe. Do not wear sandals.  If you use a stepladder:  Make sure that it is fully opened. Do not climb a closed  stepladder.  Make sure that both sides of the stepladder are locked into place.  Ask someone to hold it for you, if possible.  Clearly mark and make sure that you can see:  Any grab bars or handrails.  First and last steps.  Where the edge of each step is.  Use tools that help you move around (mobility aids) if they are needed. These include:  Canes.  Walkers.  Scooters.  Crutches.  Turn on the lights when you go into a dark area. Replace any light bulbs as soon as they burn out.  Set up your furniture so you have a clear path. Avoid moving your furniture around.  If any of your floors are uneven, fix them.  If there are any pets around you, be aware of where they are.  Review your medicines with your doctor. Some medicines can make you feel dizzy. This can increase your chance of falling. Ask your doctor what other things that you can do to help prevent falls. This information is not intended to replace advice given to you by your health care provider. Make sure you discuss any questions you have with your health care provider. Document Released: 11/10/2008 Document Revised: 06/22/2015 Document Reviewed: 02/18/2014 Elsevier Interactive Patient Education  2017 [employer].

## 2019-08-26 ENCOUNTER — Ambulatory Visit: Payer: Medicare Other | Admitting: Family Medicine

## 2019-09-05 ENCOUNTER — Other Ambulatory Visit: Payer: Self-pay | Admitting: Family Medicine

## 2019-09-05 DIAGNOSIS — I10 Essential (primary) hypertension: Secondary | ICD-10-CM

## 2019-09-24 ENCOUNTER — Ambulatory Visit (INDEPENDENT_AMBULATORY_CARE_PROVIDER_SITE_OTHER): Payer: Medicare Other | Admitting: Family Medicine

## 2019-09-24 ENCOUNTER — Other Ambulatory Visit: Payer: Self-pay

## 2019-09-24 ENCOUNTER — Encounter: Payer: Self-pay | Admitting: Family Medicine

## 2019-09-24 VITALS — BP 137/84 | HR 69 | Temp 98.5°F | Ht 59.0 in | Wt 114.6 lb

## 2019-09-24 DIAGNOSIS — I10 Essential (primary) hypertension: Secondary | ICD-10-CM

## 2019-09-24 DIAGNOSIS — R079 Chest pain, unspecified: Secondary | ICD-10-CM | POA: Diagnosis not present

## 2019-09-24 DIAGNOSIS — I700 Atherosclerosis of aorta: Secondary | ICD-10-CM | POA: Diagnosis not present

## 2019-09-24 DIAGNOSIS — Z170 Estrogen receptor positive status [ER+]: Secondary | ICD-10-CM

## 2019-09-24 DIAGNOSIS — F3342 Major depressive disorder, recurrent, in full remission: Secondary | ICD-10-CM | POA: Diagnosis not present

## 2019-09-24 DIAGNOSIS — C50111 Malignant neoplasm of central portion of right female breast: Secondary | ICD-10-CM | POA: Diagnosis not present

## 2019-09-24 NOTE — Assessment & Plan Note (Signed)
Under good control on current regimen. Continue current regimen. Continue to monitor. Call with any concerns. Refills given.   

## 2019-09-24 NOTE — Progress Notes (Signed)
BP 137/84 (BP Location: Left Arm, Patient Position: Sitting, Cuff Size: Normal)   Pulse 69   Temp 98.5 F (36.9 C) (Oral)   Ht 4\' 11"  (1.499 m)   Wt 114 lb 9.6 oz (52 kg)   SpO2 100%   BMI 23.15 kg/m    Subjective:    Patient ID: Casey Summers, female    DOB: 1946-01-26, 74 y.o.   MRN: [id number]  HPI: Casey Summers is a 74 y.o. female  Chief Complaint  Patient presents with  . Hypertension  . Hyperlipidemia   CHEST PAIN Time since onset: few times time in the past 6 months Onset: sudden Quality: heavy Severity: moderate Location: left para substernal Radiation: none Episode duration: few minutes Frequency: intermittent Related to exertion: yes Activity when pain started: activity Trauma: no Anxiety/recent stressors: yes Status: fluctuating Treatments attempted: nothing  Current pain status: pain free Shortness of breath: no Cough: no Nausea: no Diaphoresis: no Heartburn: yes Palpitations: no  HYPERTENSION Hypertension status: stable  Satisfied with current treatment? yes Duration of hypertension: chronic BP monitoring frequency:  not checking BP medication side effects:  no Medication compliance: excellent compliance Previous BP meds: benazepril. metoprolol Aspirin: yes Recurrent headaches: yes Visual changes: no Palpitations: no Dyspnea: no Chest pain: yes Lower extremity edema: no Dizzy/lightheaded: no  DEPRESSION Mood status: stable Satisfied with current treatment?: yes Symptom severity: mild  Duration of current treatment : chronic Side effects: no Medication compliance: excellent compliance Psychotherapy/counseling: yes  Previous psychiatric medications: sertraline Depressed mood: no Anxious mood: no Anhedonia: no Significant weight loss or gain: no Insomnia: no  Fatigue: yes Feelings of worthlessness or guilt: no Impaired concentration/indecisiveness: no Suicidal ideations: no Hopelessness: no Crying spells:  no Depression screen [facility] 2/9 08/23/2019 02/22/2019 08/17/2018 09/09/2017 06/30/2017  Decreased Interest 0 1 0 0 0  Down, Depressed, Hopeless 0 2 0 0 0  PHQ - 2 Score 0 3 0 0 0  Altered sleeping 0 1 - 0 0  Tired, decreased energy 0 1 - 0 0  Change in appetite 0 2 - 0 0  Feeling bad or failure about yourself  0 0 - 0 0  Trouble concentrating 0 0 - 0 0  Moving slowly or fidgety/restless 0 0 - 0 0  Suicidal thoughts 0 0 - 0 0  PHQ-9 Score 0 7 - 0 0  Difficult doing work/chores Not difficult at all Somewhat difficult - - -    CHRONIC PAIN  Present dose: 5 Morphine equivalents Pain control status: stable Duration: chronic Location: low back Quality: aching and sore Current Pain Level: moderate Previous Pain Level: moderate Breakthrough pain: no Benefit from narcotic medications: no What Activities task can be accomplished with current medication? Able to do her ADLs Interested in weaning off narcotics:no   Stool softners/OTC fiber: no  Previous pain specialty evaluation: no Non-narcotic analgesic meds: no Narcotic contract: yes   Relevant past medical, surgical, family and social history reviewed and updated as indicated. Interim medical history since our last visit reviewed. Allergies and medications reviewed and updated.  Review of Systems  Constitutional: Negative.   HENT: Negative.   Respiratory: Negative.   Cardiovascular: Positive for chest pain. Negative for palpitations and leg swelling.  Gastrointestinal: Negative.   Psychiatric/Behavioral: Negative.     Per HPI unless specifically indicated above     Objective:    BP 137/84 (BP Location: Left Arm, Patient Position: Sitting, Cuff Size: Normal)   Pulse 69   Temp 98.5 F (  36.9 C) (Oral)   Ht 4\' 11"  (1.499 m)   Wt 114 lb 9.6 oz (52 kg)   SpO2 100%   BMI 23.15 kg/m   Wt Readings from Last 3 Encounters:  09/24/19 114 lb 9.6 oz (52 kg)  08/23/19 113 lb (51.3 kg)  05/25/19 113 lb 6.4 oz (51.4 kg)    Physical  Exam Vitals and nursing note reviewed.  Constitutional:      General: She is not in acute distress.    Appearance: Normal appearance. She is not ill-appearing, toxic-appearing or diaphoretic.  HENT:     Head: Normocephalic and atraumatic.     Right Ear: External ear normal.     Left Ear: External ear normal.     Nose: Nose normal.     Mouth/Throat:     Mouth: Mucous membranes are moist.     Pharynx: Oropharynx is clear.  Eyes:     General: No scleral icterus.       Right eye: No discharge.        Left eye: No discharge.     Extraocular Movements: Extraocular movements intact.     Conjunctiva/sclera: Conjunctivae normal.     Pupils: Pupils are equal, round, and reactive to light.  Cardiovascular:     Rate and Rhythm: Normal rate and regular rhythm.     Pulses: Normal pulses.     Heart sounds: Normal heart sounds. No murmur heard.  No friction rub. No gallop.   Pulmonary:     Effort: Pulmonary effort is normal. No respiratory distress.     Breath sounds: Normal breath sounds. No stridor. No wheezing, rhonchi or rales.  Chest:     Chest wall: No tenderness.  Musculoskeletal:        General: Normal range of motion.     Cervical back: Normal range of motion and neck supple.  Skin:    General: Skin is warm and dry.     Capillary Refill: Capillary refill takes less than 2 seconds.     Coloration: Skin is not jaundiced or pale.     Findings: No bruising, erythema, lesion or rash.  Neurological:     General: No focal deficit present.     Mental Status: She is alert and oriented to person, place, and time. Mental status is at baseline.  Psychiatric:        Mood and Affect: Mood normal.        Behavior: Behavior normal.        Thought Content: Thought content normal.        Judgment: Judgment normal.     Results for orders placed or performed in visit on 09/24/19  Comprehensive metabolic panel  Result Value Ref Range   Glucose 94 65 - 99 mg/dL   BUN 14 8 - 27 mg/dL    Creatinine, Ser 0.76 0.57 - 1.00 mg/dL   GFR calc non Af Amer 79 >59 mL/min/1.73   GFR calc Af Amer 91 >59 mL/min/1.73   BUN/Creatinine Ratio 18 12 - 28   Sodium 136 134 - 144 mmol/L   Potassium 4.4 3.5 - 5.2 mmol/L   Chloride 97 96 - 106 mmol/L   CO2 24 20 - 29 mmol/L   Calcium 9.4 8.7 - 10.3 mg/dL   Total Protein 6.9 6.0 - 8.5 g/dL   Albumin 4.7 3.7 - 4.7 g/dL   Globulin, Total 2.2 1.5 - 4.5 g/dL   Albumin/Globulin Ratio 2.1 1.2 - 2.2   Bilirubin Total 1.2 0.0 -  1.2 mg/dL   Alkaline Phosphatase 82 48 - 121 IU/L   AST 83 (H) 0 - 40 IU/L   ALT 90 (H) 0 - 32 IU/L  CBC with Differential/Platelet  Result Value Ref Range   WBC 5.4 3.4 - 10.8 x10E3/uL   RBC 4.26 3.77 - 5.28 x10E6/uL   Hemoglobin 14.5 11.1 - 15.9 g/dL   Hematocrit 42.6 34.0 - 46.6 %   MCV 100 (H) 79 - 97 fL   MCH 34.0 (H) 26.6 - 33.0 pg   MCHC 34.0 31 - 35 g/dL   RDW 11.6 (L) 11.7 - 15.4 %   Platelets 176 150 - 450 x10E3/uL   Neutrophils 72 Not Estab. %   Lymphs 17 Not Estab. %   Monocytes 10 Not Estab. %   Eos 1 Not Estab. %   Basos 0 Not Estab. %   Neutrophils Absolute 3.8 1 - 7 x10E3/uL   Lymphocytes Absolute 0.9 0 - 3 x10E3/uL   Monocytes Absolute 0.6 0 - 0 x10E3/uL   EOS (ABSOLUTE) 0.1 0.0 - 0.4 x10E3/uL   Basophils Absolute 0.0 0 - 0 x10E3/uL   Immature Granulocytes 0 Not Estab. %   Immature Grans (Abs) 0.0 0.0 - 0.1 x10E3/uL  Lipid Panel w/o Chol/HDL Ratio  Result Value Ref Range   Cholesterol, Total 196 100 - 199 mg/dL   Triglycerides 64 0 - 149 mg/dL   HDL 85 >39 mg/dL   VLDL Cholesterol Cal 12 5 - 40 mg/dL   LDL Chol Calc (NIH) 99 0 - 99 mg/dL  TSH  Result Value Ref Range   TSH 0.849 0.450 - 4.500 uIU/mL      Assessment & Plan:   Problem List Items Addressed This Visit      Cardiovascular and Mediastinum   Hypertension    Under good control on current regimen. Continue current regimen. Continue to monitor. Call with any concerns. Refills given. Labs drawn today.       Relevant Orders    Comprehensive metabolic panel (Completed)   CBC with Differential/Platelet (Completed)   Microalbumin, Urine Waived   TSH (Completed)   Urinalysis, Routine w reflex microscopic   Aortic atherosclerosis (HCC)    Will keep BP and cholesterol under good control. Patient declines statin at this time. Continue to monitor. Call with any concerns.       Relevant Orders   Comprehensive metabolic panel (Completed)   CBC with Differential/Platelet (Completed)   Lipid Panel w/o Chol/HDL Ratio (Completed)     Other   Breast cancer ([place])    Up to date on mammogram. Continue to monitor. Call with any concerns. Continue to monitor.       Depression    Under good control on current regimen. Continue current regimen. Continue to monitor. Call with any concerns. Refills given.        Relevant Orders   Comprehensive metabolic panel (Completed)   CBC with Differential/Platelet (Completed)   TSH (Completed)    Other Visit Diagnoses    Chest pain, unspecified type    -  Primary   EKG with poor baseline today. Will get her into cardiology. Referral generated today. Declines statin. Continue to monitor.    Relevant Orders   EKG 12-Lead (Completed)   Comprehensive metabolic panel (Completed)   CBC with Differential/Platelet (Completed)       Follow up plan: Return in about 3 months (around 12/25/2019).

## 2019-09-24 NOTE — Assessment & Plan Note (Signed)
Under good control on current regimen. Continue current regimen. Continue to monitor. Call with any concerns. Refills given. Labs drawn today.   

## 2019-09-24 NOTE — Assessment & Plan Note (Signed)
Up to date on mammogram. Continue to monitor. Call with any concerns. Continue to monitor.

## 2019-09-24 NOTE — Assessment & Plan Note (Signed)
Will keep BP and cholesterol under good control. Patient declines statin at this time. Continue to monitor. Call with any concerns.

## 2019-09-25 LAB — LIPID PANEL W/O CHOL/HDL RATIO
Cholesterol, Total: 196 mg/dL (ref 100–199)
HDL: 85 mg/dL (ref >39)
LDL Chol Calc (NIH): 99 mg/dL (ref 0–99)
Triglycerides: 64 mg/dL (ref 0–149)
VLDL Cholesterol Cal: 12 mg/dL (ref 5–40)

## 2019-09-25 LAB — COMPREHENSIVE METABOLIC PANEL
ALT: 90 IU/L — ABNORMAL HIGH (ref 0–32)
AST: 83 IU/L — ABNORMAL HIGH (ref 0–40)
Albumin/Globulin Ratio: 2.1 (ref 1.2–2.2)
Albumin: 4.7 g/dL (ref 3.7–4.7)
Alkaline Phosphatase: 82 IU/L (ref 48–121)
BUN/Creatinine Ratio: 18 (ref 12–28)
BUN: 14 mg/dL (ref 8–27)
Bilirubin Total: 1.2 mg/dL (ref 0.0–1.2)
CO2: 24 mmol/L (ref 20–29)
Calcium: 9.4 mg/dL (ref 8.7–10.3)
Chloride: 97 mmol/L (ref 96–106)
Creatinine, Ser: 0.76 mg/dL (ref 0.57–1.00)
GFR calc Af Amer: 91 mL/min/{1.73_m2} (ref >59)
GFR calc non Af Amer: 79 mL/min/{1.73_m2} (ref >59)
Globulin, Total: 2.2 g/dL (ref 1.5–4.5)
Glucose: 94 mg/dL (ref 65–99)
Potassium: 4.4 mmol/L (ref 3.5–5.2)
Sodium: 136 mmol/L (ref 134–144)
Total Protein: 6.9 g/dL (ref 6.0–8.5)

## 2019-09-25 LAB — CBC WITH DIFFERENTIAL/PLATELET
Basophils Absolute: 0 10*3/uL (ref 0.0–0.2)
Basos: 0 %
EOS (ABSOLUTE): 0.1 10*3/uL (ref 0.0–0.4)
Eos: 1 %
Hematocrit: 42.6 % (ref 34.0–46.6)
Hemoglobin: 14.5 g/dL (ref 11.1–15.9)
Immature Grans (Abs): 0 10*3/uL (ref 0.0–0.1)
Immature Granulocytes: 0 %
Lymphocytes Absolute: 0.9 10*3/uL (ref 0.7–3.1)
Lymphs: 17 %
MCH: 34 pg — ABNORMAL HIGH (ref 26.6–33.0)
MCHC: 34 g/dL (ref 31.5–35.7)
MCV: 100 fL — ABNORMAL HIGH (ref 79–97)
Monocytes Absolute: 0.6 10*3/uL (ref 0.1–0.9)
Monocytes: 10 %
Neutrophils Absolute: 3.8 10*3/uL (ref 1.4–7.0)
Neutrophils: 72 %
Platelets: 176 10*3/uL (ref 150–450)
RBC: 4.26 x10E6/uL (ref 3.77–5.28)
RDW: 11.6 % — ABNORMAL LOW (ref 11.7–15.4)
WBC: 5.4 10*3/uL (ref 3.4–10.8)

## 2019-09-25 LAB — TSH: TSH: 0.849 u[IU]/mL (ref 0.450–4.500)

## 2019-09-27 ENCOUNTER — Encounter: Payer: Self-pay | Admitting: Family Medicine

## 2019-09-29 ENCOUNTER — Other Ambulatory Visit: Payer: Medicare Other

## 2019-09-29 ENCOUNTER — Other Ambulatory Visit: Payer: Self-pay

## 2019-09-29 DIAGNOSIS — I10 Essential (primary) hypertension: Secondary | ICD-10-CM | POA: Diagnosis not present

## 2019-09-29 LAB — URINALYSIS, ROUTINE W REFLEX MICROSCOPIC
Bilirubin, UA: NEGATIVE
Glucose, UA: NEGATIVE
Ketones, UA: NEGATIVE
Nitrite, UA: NEGATIVE
Protein,UA: NEGATIVE
RBC, UA: NEGATIVE
Specific Gravity, UA: <1.005 — ABNORMAL LOW (ref 1.005–1.030)
Urobilinogen, Ur: 0.2 mg/dL (ref 0.2–1.0)
pH, UA: 5.5 (ref 5.0–7.5)

## 2019-09-29 LAB — MICROSCOPIC EXAMINATION
Bacteria, UA: NONE SEEN
RBC, Urine: NONE SEEN /hpf (ref 0–2)

## 2019-09-29 LAB — MICROALBUMIN, URINE WAIVED
Creatinine, Urine Waived: 50 mg/dL (ref 10–300)
Microalb, Ur Waived: 10 mg/L (ref 0–19)
Microalb/Creat Ratio: <30 mg/g (ref <30)

## 2019-10-05 NOTE — Progress Notes (Signed)
Interpreted by me today. NSR at 68bpm, no ST segment changes.

## 2019-10-12 ENCOUNTER — Encounter: Payer: Self-pay | Admitting: Emergency Medicine

## 2019-10-12 ENCOUNTER — Ambulatory Visit
Admission: EM | Admit: 2019-10-12 | Discharge: 2019-10-12 | Disposition: A | Discharge status: Home / Self Care | Payer: Medicare Other | Attending: Family Medicine | Admitting: Family Medicine

## 2019-10-12 ENCOUNTER — Ambulatory Visit: Payer: Self-pay | Admitting: *Deleted

## 2019-10-12 ENCOUNTER — Other Ambulatory Visit: Payer: Self-pay

## 2019-10-12 DIAGNOSIS — R0789 Other chest pain: Secondary | ICD-10-CM | POA: Diagnosis not present

## 2019-10-12 DIAGNOSIS — R079 Chest pain, unspecified: Secondary | ICD-10-CM | POA: Diagnosis not present

## 2019-10-12 LAB — COMPREHENSIVE METABOLIC PANEL
ALT: 76 U/L — ABNORMAL HIGH (ref 0–44)
AST: 76 U/L — ABNORMAL HIGH (ref 15–41)
Albumin: 4.6 g/dL (ref 3.5–5.0)
Alkaline Phosphatase: 76 U/L (ref 38–126)
Anion gap: 11 (ref 5–15)
BUN: 14 mg/dL (ref 8–23)
CO2: 28 mmol/L (ref 22–32)
Calcium: 9.6 mg/dL (ref 8.9–10.3)
Chloride: 98 mmol/L (ref 98–111)
Creatinine, Ser: 0.62 mg/dL (ref 0.44–1.00)
GFR calc Af Amer: >60 mL/min (ref >60)
GFR calc non Af Amer: >60 mL/min (ref >60)
Glucose, Bld: 104 mg/dL — ABNORMAL HIGH (ref 70–99)
Potassium: 4 mmol/L (ref 3.5–5.1)
Sodium: 137 mmol/L (ref 135–145)
Total Bilirubin: 0.6 mg/dL (ref 0.3–1.2)
Total Protein: 7.6 g/dL (ref 6.5–8.1)

## 2019-10-12 LAB — TROPONIN I (HIGH SENSITIVITY): Troponin I (High Sensitivity): <2 ng/L (ref <18)

## 2019-10-12 LAB — CBC WITH DIFFERENTIAL/PLATELET
Abs Immature Granulocytes: 0.02 10*3/uL (ref 0.00–0.07)
Basophils Absolute: 0 10*3/uL (ref 0.0–0.1)
Basophils Relative: 0 %
Eosinophils Absolute: 0.1 10*3/uL (ref 0.0–0.5)
Eosinophils Relative: 1 %
HCT: 41.1 % (ref 36.0–46.0)
Hemoglobin: 14.6 g/dL (ref 12.0–15.0)
Immature Granulocytes: 0 %
Lymphocytes Relative: 18 %
Lymphs Abs: 1.1 10*3/uL (ref 0.7–4.0)
MCH: 34.8 pg — ABNORMAL HIGH (ref 26.0–34.0)
MCHC: 35.5 g/dL (ref 30.0–36.0)
MCV: 97.9 fL (ref 80.0–100.0)
Monocytes Absolute: 0.6 10*3/uL (ref 0.1–1.0)
Monocytes Relative: 9 %
Neutro Abs: 4.3 10*3/uL (ref 1.7–7.7)
Neutrophils Relative %: 72 %
Platelets: 218 10*3/uL (ref 150–400)
RBC: 4.2 MIL/uL (ref 3.87–5.11)
RDW: 11.6 % (ref 11.5–15.5)
WBC: 6 10*3/uL (ref 4.0–10.5)
nRBC: 0 % (ref 0.0–0.2)

## 2019-10-12 MED ORDER — NAPROXEN 375 MG PO TABS: 375.0000 mg | ORAL_TABLET | Freq: Two times a day (BID) | ORAL | 0 refills | Status: DC | PRN

## 2019-10-12 NOTE — ED Provider Notes (Signed)
[facility]    CSN: [id number] Arrival date & time: 10/12/19  1632      History   Chief Complaint Chief Complaint  Patient presents with  . Chest Pain  . Numbness   HPI   74 year old female presents with the above complaints.  Patient reports that symptoms started this morning.  She reports left-sided chest pain which she feels like is a tightness.  Pain 6/10 in severity.  She reports that she has some shortness of breath particular with exertion but this is not new.  She also reports numbness of her left arm.  No weakness.  No nausea vomiting.  No relieving factors.  No other complaints at this time.  Past Medical History:  Diagnosis Date  . Allergy   . Cancer (HCC)    breast  . Chronic mid back pain   . Depression   . Hypertension   . Osteoporosis     Patient Active Problem List   Diagnosis Date Noted  . Controlled substance agreement signed 02/22/2019  . Aortic atherosclerosis ([place]) 09/09/2017  . Liver hemangioma 08/12/2017  . Depression 05/29/2017  . Advanced care planning/counseling discussion 08/22/2016  . Breast cancer ([place]) 08/22/2016  . S/P TAH-BSO 08/22/2016  . Low back pain 11/28/2014  . Hypertension     Past Surgical History:  Procedure Laterality Date  . ABDOMINAL HYSTERECTOMY    . BREAST LUMPECTOMY  2010    OB History   No obstetric history on file.      Home Medications    Prior to Admission medications   Medication Sig Start Date End Date Taking? Authorizing Provider  alendronate (FOSAMAX) 70 MG tablet Take 1 tablet (70 mg total) by mouth every 7 (seven) days. Take with a full glass of water on an empty stomach. 02/22/19  Yes Johnson, Megan P, DO  aspirin 81 MG tablet Take 81 mg by mouth.  02/05/11  Yes [provider]  benazepril (LOTENSIN) 40 MG tablet TAKE 1 TABLET BY MOUTH EVERY DAY 09/05/19  Yes Johnson, Megan P, DO  Cholecalciferol (VITAMIN D-1000 MAX ST) 1000 UNITS tablet Take by mouth. 08/21/10  Yes [provider]  metoprolol succinate (TOPROL-XL) 25 MG 24 hr tablet Take 1 tablet (25 mg total) by mouth daily. 02/22/19  Yes Johnson, Megan P, DO  omeprazole (PRILOSEC) 20 MG capsule Take 1 capsule (20 mg total) by mouth daily. 05/25/19  Yes Johnson, Megan P, DO  sertraline (ZOLOFT) 50 MG tablet Take 1 tablet (50 mg total) by mouth daily. 02/22/19  Yes Johnson, Megan P, DO  traMADol (ULTRAM) 50 MG tablet Take 0.5-1 tablets (25-50 mg total) by mouth daily as needed. 05/25/19  Yes Johnson, Megan P, DO  naproxen (NAPROSYN) 375 MG tablet Take 1 tablet (375 mg total) by mouth 2 (two) times daily as needed for moderate pain. 10/12/19   Coral Spikes, DO    Family History Family History  Problem Relation Age of Onset  . Congestive Heart Failure Mother   . Cancer Sister        breast    Social History Social History   Tobacco Use  . Smoking status: Former Smoker    Packs/day: 1.25    Years: 24.00    Pack years: 30.00    Types: Cigarettes    Quit date: 08/28/2008    Years since quitting: 11.1  . Smokeless tobacco: Never Used  Vaping Use  . Vaping Use: Never used  Substance Use Topics  . Alcohol  use: Yes    Alcohol/week: 14.0 standard drinks    Types: 14 Glasses of wine per week  . Drug use: No     Allergies   Iron metal [iron]   Review of Systems Review of Systems  Cardiovascular: Positive for chest pain.  Neurological: Positive for numbness.   Physical Exam Triage Vital Signs ED Triage Vitals  Enc Vitals Group     BP 10/12/19 1637 (!) 154/76     Pulse Rate 10/12/19 1637 87     Resp 10/12/19 1637 18     Temp 10/12/19 1637 98.3 F (36.8 C)     Temp Source 10/12/19 1637 Oral     SpO2 10/12/19 1637 97 %     Weight 10/12/19 1638 114 lb 10.2 oz (52 kg)     Height 10/12/19 1638 4\' 11"  (1.499 m)     Head Circumference --      Peak Flow --      Pain Score 10/12/19 1637 6     Pain Loc --      Pain Edu? --      Excl. in [place]? --    No data found.  Updated Vital Signs BP (!)  154/76 (BP Location: Right Arm)   Pulse 87   Temp 98.3 F (36.8 C) (Oral)   Resp 18   Ht 4\' 11"  (1.499 m)   Wt 52 kg   SpO2 97%   BMI 23.15 kg/m   Visual Acuity Right Eye Distance:   Left Eye Distance:   Bilateral Distance:    Right Eye Near:   Left Eye Near:    Bilateral Near:     Physical Exam Constitutional:      General: She is not in acute distress.    Appearance: Normal appearance. She is not ill-appearing.  HENT:     Head: Normocephalic and atraumatic.  Eyes:     General:        Right eye: No discharge.        Left eye: No discharge.     Conjunctiva/sclera: Conjunctivae normal.  Cardiovascular:     Rate and Rhythm: Normal rate and regular rhythm.  Pulmonary:     Effort: Pulmonary effort is normal.     Breath sounds: Normal breath sounds. No wheezing, rhonchi or rales.  Chest:     Chest wall: Tenderness present.  Neurological:     Mental Status: She is alert.  Psychiatric:        Mood and Affect: Mood normal.        Behavior: Behavior normal.    UC Treatments / Results  Labs (all labs ordered are listed, but only abnormal results are displayed) Labs Reviewed  CBC WITH DIFFERENTIAL/PLATELET - Abnormal; Notable for the following components:      Result Value   MCH 34.8 (*)    All other components within normal limits  COMPREHENSIVE METABOLIC PANEL - Abnormal; Notable for the following components:   Glucose, Bld 104 (*)    AST 76 (*)    ALT 76 (*)    All other components within normal limits  TROPONIN I (HIGH SENSITIVITY)    EKG Interpretation: Normal sinus rhythm with rate 87.  Left axis deviation.  Q waves noted in lead III and aVF.  No ST or T wave changes.  Probable LAE.  I have compared her EKG to her prior done 09/24/19.  Unchanged.  Radiology No results found.  Procedures Procedures (including critical care time)  Medications Ordered  in UC Medications - No data to display  Initial Impression / Assessment and Plan / UC Course  I have  reviewed the triage vital signs and the nursing notes.  Pertinent labs & imaging results that were available during my care of the patient were reviewed by me and considered in my medical decision making (see chart for details).    75 year old female presents with chest pain.  This appears to be chest wall pain.  She is tender on palpation.  EKG with no evidence of ischemia.  Unchanged from prior.  Labs obtained and revealed elevated LFTs which is not a new finding.  Troponin negative.  Treating with naproxen.  Advise close follow-up with primary care physician.  Primary care physician as reportedly put in a referral to cardiology.  Advised patient to follow-up regarding this.  Final Clinical Impressions(s) / UC Diagnoses   Final diagnoses:  Chest wall pain     Discharge Instructions     Labs unremarkable.   Please follow up with your PCP regarding referral to cardiology.  Medication as directed.  Take care  Dr. Lacinda Axon    ED Prescriptions    Medication Sig Dispense Auth. Provider   naproxen (NAPROSYN) 375 MG tablet Take 1 tablet (375 mg total) by mouth 2 (two) times daily as needed for moderate pain. 20 tablet Coral Spikes, DO     PDMP not reviewed this encounter.   Coral Spikes, [place] 10/12/19 1813

## 2019-10-12 NOTE — ED Triage Notes (Signed)
Pt c/o left sided chest pain, sweats ,tingling and numbness down her left arm, and some SOB. Started this morning. Denies weakness, nausea, vomiting

## 2019-10-12 NOTE — Telephone Encounter (Signed)
Patient report tingling in left hand and arm all day- patient states she woke with this today- no other symptoms noted- no headache, nausea, chest pain, trouble breathing, numbness, weakness. Advised UC per protocol and late hour of call.  Reason for Disposition . [1] Tingling (e.g., pins and needles) of the face, arm / hand, or leg / foot on one side of the body AND [2] present now (Exceptions: chronic/recurrent symptom lasting > 4 weeks or tingling from known cause, such as: bumped elbow, carpal tunnel syndrome, pinched nerve, frostbite)  Answer Assessment - Initial Assessment Questions 1. SYMPTOM: "What is the main symptom you are concerned about?" (e.g., weakness, numbness)     Tingling on left side- hand and arm 2. ONSET: "When did this start?" (minutes, hours, days; while sleeping)     This morning- all day 3. LAST NORMAL: "When was the last time you were normal (no symptoms)?"     Last night 4. PATTERN "Does this come and go, or has it been constant since it started?"  "Is it present now?"     Constant- present now 5. CARDIAC SYMPTOMS: "Have you had any of the following symptoms: chest pain, difficulty breathing, palpitations?"     no 6. NEUROLOGIC SYMPTOMS: "Have you had any of the following symptoms: headache, dizziness, vision loss, double vision, changes in speech, unsteady on your feet?"     no 7. OTHER SYMPTOMS: "Do you have any other symptoms?"     no 8. PREGNANCY: "Is there any chance you are pregnant?" "When was your last menstrual period?"     n/a  Protocols used: NEUROLOGIC DEFICIT-A-AH

## 2019-10-12 NOTE — Discharge Instructions (Addendum)
Labs unremarkable.   Please follow up with your PCP regarding referral to cardiology.  Medication as directed.  Take care  Dr. Lacinda Axon

## 2019-11-14 ENCOUNTER — Other Ambulatory Visit: Payer: Self-pay | Admitting: Family Medicine

## 2019-11-14 DIAGNOSIS — I10 Essential (primary) hypertension: Secondary | ICD-10-CM

## 2019-11-14 NOTE — Telephone Encounter (Signed)
Requested Prescriptions  Pending Prescriptions Disp Refills  . benazepril (LOTENSIN) 40 MG tablet [Pharmacy Med Name: BENAZEPRIL HCL 40 MG TABLET] 90 tablet 0    Sig: TAKE 1 TABLET BY MOUTH EVERY DAY     Cardiovascular:  ACE Inhibitors Failed - 11/14/2019  5:32 PM      Failed - Last BP in normal range    BP Readings from Last 1 Encounters:  10/12/19 (!) 154/76         Passed - Cr in normal range and within 180 days    Creatinine  Date Value Ref Range Status  02/22/2019 116.9 20.0 - 300.0 mg/dL Final   Creatinine, Ser  Date Value Ref Range Status  10/12/2019 0.62 0.44 - 1.00 mg/dL Final         Passed - K in normal range and within 180 days    Potassium  Date Value Ref Range Status  10/12/2019 4.0 3.5 - 5.1 mmol/L Final         Passed - Patient is not pregnant      Passed - Valid encounter within last 6 months    Recent Outpatient Visits          1 month ago Chest pain, unspecified type   [facility] [place], Megan P, DO   5 months ago Chronic left-sided low back pain with left-sided sciatica   [facility] [place], Megan P, DO   8 months ago Chronic left-sided low back pain with left-sided sciatica   [facility] [place], Megan P, DO   1 year ago Essential hypertension   [facility] [place], Jeannette How, MD   1 year ago Essential hypertension   [place], Jeannette How, MD      Future Appointments            In 9 months [place], [place]

## 2019-12-01 ENCOUNTER — Telehealth: Payer: Self-pay

## 2019-12-01 DIAGNOSIS — R079 Chest pain, unspecified: Secondary | ICD-10-CM

## 2019-12-01 DIAGNOSIS — I10 Essential (primary) hypertension: Secondary | ICD-10-CM

## 2019-12-01 DIAGNOSIS — I700 Atherosclerosis of aorta: Secondary | ICD-10-CM

## 2019-12-01 NOTE — Telephone Encounter (Signed)
Copied from [name] [id number]. Topic: General - Other >> Dec 01, 2019 11:37 AM Casey Summers A wrote: Reason for CRM: Patient called to inquire of Dr Wynetta Emery about the Cardiologist that she mentioned setting her up with during her last visit. Patient would like a call back at Ph# [phone]

## 2019-12-01 NOTE — Telephone Encounter (Signed)
Not sure what happened, but the referral has been replaced. If she hasn't heard anything in the next couple of weeks,, please let me know

## 2019-12-01 NOTE — Telephone Encounter (Signed)
Called and LVM letting patient know that the referral has been entered for her.

## 2019-12-09 ENCOUNTER — Ambulatory Visit: Payer: Medicare Other | Admitting: Cardiology

## 2019-12-14 ENCOUNTER — Encounter: Payer: Self-pay | Admitting: Family Medicine

## 2019-12-14 ENCOUNTER — Ambulatory Visit (INDEPENDENT_AMBULATORY_CARE_PROVIDER_SITE_OTHER): Payer: Medicare Other | Admitting: Family Medicine

## 2019-12-14 ENCOUNTER — Other Ambulatory Visit: Payer: Self-pay

## 2019-12-14 VITALS — BP 117/74 | HR 73 | Temp 98.1°F | Ht 60.0 in | Wt 115.8 lb

## 2019-12-14 DIAGNOSIS — I10 Essential (primary) hypertension: Secondary | ICD-10-CM | POA: Diagnosis not present

## 2019-12-14 DIAGNOSIS — F3342 Major depressive disorder, recurrent, in full remission: Secondary | ICD-10-CM | POA: Diagnosis not present

## 2019-12-14 DIAGNOSIS — Z23 Encounter for immunization: Secondary | ICD-10-CM

## 2019-12-14 DIAGNOSIS — G8929 Other chronic pain: Secondary | ICD-10-CM

## 2019-12-14 DIAGNOSIS — M5442 Lumbago with sciatica, left side: Secondary | ICD-10-CM

## 2019-12-14 MED ORDER — METOPROLOL SUCCINATE ER 25 MG PO TB24: 25.0000 mg | ORAL_TABLET | Freq: Every day | ORAL | 1 refills | Status: DC

## 2019-12-14 MED ORDER — ALENDRONATE SODIUM 70 MG PO TABS
70.0000 mg | ORAL_TABLET | ORAL | 11 refills | Status: DC
Start: 2019-12-14 — End: 2021-01-14

## 2019-12-14 MED ORDER — BENAZEPRIL HCL 40 MG PO TABS: 40.0000 mg | ORAL_TABLET | Freq: Every day | ORAL | 1 refills | Status: DC

## 2019-12-14 MED ORDER — SERTRALINE HCL 50 MG PO TABS
50.0000 mg | ORAL_TABLET | Freq: Every day | ORAL | 1 refills | Status: DC
Start: 2019-12-14 — End: 2020-06-29

## 2019-12-14 MED ORDER — TRAMADOL HCL 50 MG PO TABS: 25.0000 mg | ORAL_TABLET | Freq: Every day | ORAL | 2 refills | Status: DC | PRN

## 2019-12-14 NOTE — Assessment & Plan Note (Signed)
Under good control on current regimen. Continue current regimen. Continue to monitor. Call with any concerns. Refills given.   

## 2019-12-14 NOTE — Progress Notes (Signed)
BP 117/74   Pulse 73   Temp 98.1 F (36.7 C) (Oral)   Ht 5' (1.524 m)   Wt 115 lb 12.8 oz (52.5 kg)   SpO2 98%   BMI 22.62 kg/m    Subjective:    Patient ID: Casey Summers, female    DOB: 01-13-1946, 74 y.o.   MRN: [id number]  HPI: Casey Summers is a 74 y.o. female  Chief Complaint  Patient presents with  . Depression  . Hypertension   HYPERTENSION Hypertension status: controlled  Satisfied with current treatment? yes Duration of hypertension: chronic BP monitoring frequency:  not checking BP medication side effects:  no Medication compliance: excellent compliance Previous BP meds: benazepril, metoprolol Aspirin: no Recurrent headaches: no Visual changes: no Palpitations: no Dyspnea: no Chest pain: no Lower extremity edema: no Dizzy/lightheaded: yes  DEPRESSION- has lost 2 of her cats suddenly and found out her fiance has prostate cancer. Has been under a lot of stress, but doing OK.  Mood status: controlled Satisfied with current treatment?: yes Symptom severity: moderate  Duration of current treatment : chronic Side effects: no Medication compliance: excellent compliance Psychotherapy/counseling: no  Previous psychiatric medications: sertraline Depressed mood: yes Anxious mood: yes Anhedonia: no Significant weight loss or gain: no Insomnia: no  Fatigue: yes Feelings of worthlessness or guilt: no Impaired concentration/indecisiveness: no Suicidal ideations: no Hopelessness: no Crying spells: no Depression screen [facility] 2/9 12/14/2019 08/23/2019 02/22/2019 08/17/2018 09/09/2017  Decreased Interest 1 0 1 0 0  Down, Depressed, Hopeless 1 0 2 0 0  PHQ - 2 Score 2 0 3 0 0  Altered sleeping 0 0 1 - 0  Tired, decreased energy 0 0 1 - 0  Change in appetite 0 0 2 - 0  Feeling bad or failure about yourself  0 0 0 - 0  Trouble concentrating 0 0 0 - 0  Moving slowly or fidgety/restless 0 0 0 - 0  Suicidal thoughts 0 0 0 - 0  PHQ-9 Score 2 0 7 - 0    Difficult doing work/chores - Not difficult at all Somewhat difficult - -   She uses her tramadol pretty rarely. She'll occasionally take a pill a day- 1/2 in the AM and 1/2 in the PM. She notes that her pain is chronic, aching and sore. Better with medicine and rest. Worse with certain activities. It does not radiate. She is otherwise doing well with no other concerns or complaints at this time.   Relevant past medical, surgical, family and social history reviewed and updated as indicated. Interim medical history since our last visit reviewed. Allergies and medications reviewed and updated.  Review of Systems  Constitutional: Negative.   Respiratory: Negative.   Cardiovascular: Negative.   Gastrointestinal: Negative.   Musculoskeletal: Negative.   Skin: Negative.   Neurological: Negative.   Psychiatric/Behavioral: Positive for dysphoric mood. Negative for agitation, behavioral problems, confusion, decreased concentration, hallucinations, self-injury, sleep disturbance and suicidal ideas. The patient is nervous/anxious. The patient is not hyperactive.     Per HPI unless specifically indicated above     Objective:    BP 117/74   Pulse 73   Temp 98.1 F (36.7 C) (Oral)   Ht 5' (1.524 m)   Wt 115 lb 12.8 oz (52.5 kg)   SpO2 98%   BMI 22.62 kg/m   Wt Readings from Last 3 Encounters:  12/14/19 115 lb 12.8 oz (52.5 kg)  10/12/19 114 lb 10.2 oz (52 kg)  09/24/19 114 lb  9.6 oz (52 kg)    Physical Exam Vitals and nursing note reviewed.  Constitutional:      General: She is not in acute distress.    Appearance: Normal appearance. She is not ill-appearing, toxic-appearing or diaphoretic.  HENT:     Head: Normocephalic and atraumatic.     Right Ear: External ear normal.     Left Ear: External ear normal.     Nose: Nose normal.     Mouth/Throat:     Mouth: Mucous membranes are moist.     Pharynx: Oropharynx is clear.  Eyes:     General: No scleral icterus.       Right eye: No  discharge.        Left eye: No discharge.     Extraocular Movements: Extraocular movements intact.     Conjunctiva/sclera: Conjunctivae normal.     Pupils: Pupils are equal, round, and reactive to light.  Cardiovascular:     Rate and Rhythm: Normal rate and regular rhythm.     Pulses: Normal pulses.     Heart sounds: Normal heart sounds. No murmur heard.  No friction rub. No gallop.   Pulmonary:     Effort: Pulmonary effort is normal. No respiratory distress.     Breath sounds: Normal breath sounds. No stridor. No wheezing, rhonchi or rales.  Chest:     Chest wall: No tenderness.  Musculoskeletal:        General: Normal range of motion.     Cervical back: Normal range of motion and neck supple.  Skin:    General: Skin is warm and dry.     Capillary Refill: Capillary refill takes less than 2 seconds.     Coloration: Skin is not jaundiced or pale.     Findings: No bruising, erythema, lesion or rash.  Neurological:     General: No focal deficit present.     Mental Status: She is alert and oriented to person, place, and time. Mental status is at baseline.  Psychiatric:        Mood and Affect: Mood normal.        Behavior: Behavior normal.        Thought Content: Thought content normal.        Judgment: Judgment normal.     Results for orders placed or performed during the hospital encounter of 10/12/19  CBC with Differential  Result Value Ref Range   WBC 6.0 4.0 - 10.5 K/uL   RBC 4.20 3.87 - 5.11 MIL/uL   Hemoglobin 14.6 12.0 - 15.0 g/dL   HCT 41.1 36 - 46 %   MCV 97.9 80.0 - 100.0 fL   MCH 34.8 (H) 26.0 - 34.0 pg   MCHC 35.5 30.0 - 36.0 g/dL   RDW 11.6 11.5 - 15.5 %   Platelets 218 150 - 400 K/uL   nRBC 0.0 0.0 - 0.2 %   Neutrophils Relative % 72 %   Neutro Abs 4.3 1.7 - 7.7 K/uL   Lymphocytes Relative 18 %   Lymphs Abs 1.1 0.7 - 4.0 K/uL   Monocytes Relative 9 %   Monocytes Absolute 0.6 0.1 - 1.0 K/uL   Eosinophils Relative 1 %   Eosinophils Absolute 0.1 0.0 - 0.5  K/uL   Basophils Relative 0 %   Basophils Absolute 0.0 0.0 - 0.1 K/uL   Immature Granulocytes 0 %   Abs Immature Granulocytes 0.02 0.00 - 0.07 K/uL  Comprehensive metabolic panel  Result Value Ref Range  Sodium 137 135 - 145 mmol/L   Potassium 4.0 3.5 - 5.1 mmol/L   Chloride 98 98 - 111 mmol/L   CO2 28 22 - 32 mmol/L   Glucose, Bld 104 (H) 70 - 99 mg/dL   BUN 14 8 - 23 mg/dL   Creatinine, Ser 0.62 0.44 - 1.00 mg/dL   Calcium 9.6 8.9 - 10.3 mg/dL   Total Protein 7.6 6.5 - 8.1 g/dL   Albumin 4.6 3.5 - 5.0 g/dL   AST 76 (H) 15 - 41 U/L   ALT 76 (H) 0 - 44 U/L   Alkaline Phosphatase 76 38 - 126 U/L   Total Bilirubin 0.6 0.3 - 1.2 mg/dL   GFR calc non Af Amer >60 >60 mL/min   GFR calc Af Amer >60 >60 mL/min   Anion gap 11 5 - 15  Troponin I (High Sensitivity)  Result Value Ref Range   Troponin I (High Sensitivity) <2 <18 ng/L      Assessment & Plan:   Problem List Items Addressed This Visit      Cardiovascular and Mediastinum   Hypertension    Under good control on current regimen. Continue current regimen. Continue to monitor. Call with any concerns. Refills given.        Relevant Medications   metoprolol succinate (TOPROL-XL) 25 MG 24 hr tablet   benazepril (LOTENSIN) 40 MG tablet     Other   Low back pain - Primary    Stable on low dose tramadol. Continue current regimen. Continue to monitor. 90 pills should last about 3-6 months. Call with any concerns. Follow up 6 months.       Relevant Medications   traMADol (ULTRAM) 50 MG tablet   Depression    Under good control on current regimen. Continue current regimen. Continue to monitor. Call with any concerns. Refills given.        Relevant Medications   sertraline (ZOLOFT) 50 MG tablet    Other Visit Diagnoses    Essential hypertension       Relevant Medications   metoprolol succinate (TOPROL-XL) 25 MG 24 hr tablet   benazepril (LOTENSIN) 40 MG tablet   Need for influenza vaccination       Flu shot given  today.    Relevant Orders   Flu Vaccine QUAD High Dose(Fluad) (Completed)       Follow up plan: Return in about 6 months (around 06/12/2020) for Physical.

## 2019-12-14 NOTE — Assessment & Plan Note (Signed)
Stable on low dose tramadol. Continue current regimen. Continue to monitor. 90 pills should last about 3-6 months. Call with any concerns. Follow up 6 months.

## 2019-12-29 ENCOUNTER — Telehealth: Payer: Self-pay

## 2019-12-29 NOTE — Telephone Encounter (Signed)
Copied from [name]. Topic: General - Other >> Dec 28, 2019  4:39 PM Casey Summers wrote: Patient was advised by pcp to call is she was having issues. Patient did not want to go into detail about what is going on but stated that Dr .Wynetta Emery would know. Please advise

## 2019-12-29 NOTE — Telephone Encounter (Signed)
LVM asking to give details as to what is going on.

## 2019-12-30 ENCOUNTER — Other Ambulatory Visit: Payer: Self-pay

## 2019-12-30 ENCOUNTER — Encounter: Payer: Self-pay | Admitting: Cardiology

## 2019-12-30 ENCOUNTER — Ambulatory Visit (INDEPENDENT_AMBULATORY_CARE_PROVIDER_SITE_OTHER): Payer: Medicare Other | Admitting: Cardiology

## 2019-12-30 VITALS — BP 130/82 | HR 70 | Ht 60.0 in | Wt 116.0 lb

## 2019-12-30 DIAGNOSIS — R072 Precordial pain: Secondary | ICD-10-CM

## 2019-12-30 DIAGNOSIS — R0600 Dyspnea, unspecified: Secondary | ICD-10-CM | POA: Diagnosis not present

## 2019-12-30 DIAGNOSIS — I10 Essential (primary) hypertension: Secondary | ICD-10-CM

## 2019-12-30 DIAGNOSIS — R0609 Other forms of dyspnea: Secondary | ICD-10-CM

## 2019-12-30 NOTE — Progress Notes (Signed)
Cardiology Office Note:    Date:  12/30/2019   ID:  Casey Summers, DOB 05-13-1945, MRN [id number]  PCP:  Valerie Roys, DO  [place] Cardiologist:  No primary care provider on file.  [facility] Electrophysiologist:  None   Referring MD: Valerie Roys, DO   Chief Complaint  Patient presents with  . New Patient (Initial Visit)    Establish care with provider for recurrent chest pains and SOB.  Medications verbally reviewed with patient.     History of Present Illness:    Casey Summers is a 74 y.o. female with a hx of Hypertension, GERD, former smoker x10 years who presents due to chest pain and shortness of breath. She states having shortness of breath with exertion which is worsened over the past 6 months. Symptoms are sometimes associated with chest pain. Chest pain is sharp in nature. She is worried since her mom passed from heart attack in her 43s. Denies edema, palpitations. Also thinks her shortness of breath might be secondary to deconditioning.  Patient seen in the ED on 10/12/2019 due to chest pain 6 out of 10 in severity, associated with some shortness of breath.  EKG did not show any evidence for ischemia, troponins were normal.  She had pain on her chest wall reproducible with palpation.  Past Medical History:  Diagnosis Date  . Allergy   . Cancer (HCC)    breast  . Chronic mid back pain   . Depression   . Hypertension   . Osteoporosis     Past Surgical History:  Procedure Laterality Date  . ABDOMINAL HYSTERECTOMY    . BREAST LUMPECTOMY  2010    Current Medications: Current Meds  Medication Sig  . alendronate (FOSAMAX) 70 MG tablet Take 1 tablet (70 mg total) by mouth every 7 (seven) days. Take with a full glass of water on an empty stomach.  Marland Kitchen aspirin 81 MG tablet Take 81 mg by mouth.   . benazepril (LOTENSIN) 40 MG tablet Take 1 tablet (40 mg total) by mouth daily.  . Cholecalciferol (VITAMIN D-1000 MAX ST) 1000 UNITS tablet Take by  mouth.  . metoprolol succinate (TOPROL-XL) 25 MG 24 hr tablet Take 1 tablet (25 mg total) by mouth daily.  . naproxen (NAPROSYN) 375 MG tablet Take 1 tablet (375 mg total) by mouth 2 (two) times daily as needed for moderate pain.  Marland Kitchen omeprazole (PRILOSEC) 20 MG capsule Take 1 capsule (20 mg total) by mouth daily.  . sertraline (ZOLOFT) 50 MG tablet Take 1 tablet (50 mg total) by mouth daily.  . traMADol (ULTRAM) 50 MG tablet Take 0.5-1 tablets (25-50 mg total) by mouth daily as needed.     Allergies:   Iron metal [iron]   Social History   Socioeconomic History  . Marital status: Divorced    Spouse name: Not on file  . Number of children: Not on file  . Years of education: Not on file  . Highest education level: Associate degree: academic program  Occupational History  . Occupation: retired  Tobacco Use  . Smoking status: Former Smoker    Packs/day: 1.25    Years: 24.00    Pack years: 30.00    Types: Cigarettes    Quit date: 08/28/2008    Years since quitting: 11.3  . Smokeless tobacco: Never Used  Vaping Use  . Vaping Use: Never used  Substance and Sexual Activity  . Alcohol use: Yes    Alcohol/week: 14.0 standard drinks  Types: 14 Glasses of wine per week  . Drug use: No  . Sexual activity: Not on file  Other Topics Concern  . Not on file  Social History Narrative  . Not on file   Social Determinants of Health   Financial Resource Strain: Low Risk   . Difficulty of Paying Living Expenses: Not hard at all  Food Insecurity: No Food Insecurity  . Worried About Charity fundraiser in the Last Year: Never true  . Ran Out of Food in the Last Year: Never true  Transportation Needs: No Transportation Needs  . Lack of Transportation (Medical): No  . Lack of Transportation (Non-Medical): No  Physical Activity: Inactive  . Days of Exercise per Week: 0 days  . Minutes of Exercise per Session: 0 min  Stress: No Stress Concern Present  . Feeling of Stress : Not at all    Social Connections:   . Frequency of Communication with Friends and Family: Not on file  . Frequency of Social Gatherings with Friends and Family: Not on file  . Attends Religious Services: Not on file  . Active Member of Clubs or Organizations: Not on file  . Attends Archivist Meetings: Not on file  . Marital Status: Not on file     Family History: The patient's family history includes Cancer in her sister; Congestive Heart Failure in her mother.  ROS:   Please see the history of present illness.     All other systems reviewed and are negative.  EKGs/Labs/Other Studies Reviewed:    The following studies were reviewed today:   EKG:  EKG is  ordered today.  The ekg ordered today demonstrates normal sinus rhythm, possible old inferior infarct  Recent Labs: 09/24/2019: TSH 0.849 10/12/2019: ALT 76; BUN 14; Creatinine, Ser 0.62; Hemoglobin 14.6; Platelets 218; Potassium 4.0; Sodium 137  Recent Lipid Panel    Component Value Date/Time   CHOL 196 09/24/2019 1150   TRIG 64 09/24/2019 1150   HDL 85 09/24/2019 1150   CHOLHDL 2.9 09/09/2017 1211   LDLCALC 99 09/24/2019 1150     Risk Assessment/Calculations:      Physical Exam:    VS:  BP 130/82 (BP Location: Right Arm, Patient Position: Sitting, Cuff Size: Normal)   Pulse 70   Ht 5' (1.524 m)   Wt 116 lb (52.6 kg)   SpO2 98%   BMI 22.65 kg/m     Wt Readings from Last 3 Encounters:  12/30/19 116 lb (52.6 kg)  12/14/19 115 lb 12.8 oz (52.5 kg)  10/12/19 114 lb 10.2 oz (52 kg)     GEN:  Well nourished, well developed in no acute distress HEENT: Normal NECK: No JVD; No carotid bruits LYMPHATICS: No lymphadenopathy CARDIAC: RRR, no murmurs, rubs, gallops RESPIRATORY:  Clear to auscultation without rales, wheezing or rhonchi  ABDOMEN: Soft, non-tender, non-distended MUSCULOSKELETAL:  No edema; tenderness to palpation noted along the left sternal border SKIN: Warm and dry NEUROLOGIC:  Alert and oriented x  3 PSYCHIATRIC:  Normal affect   ASSESSMENT:    1. Dyspnea on exertion   2. Precordial pain   3. Primary hypertension    PLAN:    In order of problems listed above:  1. Patient with worsening shortness of breath. Obtain echocardiogram. Symptoms could be secondary to deconditioning. Graduated exercise/activity levels encouraged. If symptoms of dyspnea on exertion persist, will consider stress testing. 2. Noncardiac chest pain, reproducible with palpation. Likely musculoskeletal etiology. Patient made aware and reassured.  3. History of hypertension, blood pressure controlled. Continue current meds  Follow-up after echocardiogram    Shared Decision Making/Informed Consent       Medication Adjustments/Labs and Tests Ordered: Current medicines are reviewed at length with the patient today.  Concerns regarding medicines are outlined above.  Orders Placed This Encounter  Procedures  . EKG 12-Lead  . ECHOCARDIOGRAM COMPLETE   No orders of the defined types were placed in this encounter.   Patient Instructions  Medication Instructions:   Your physician recommends that you continue on your current medications as directed. Please refer to the Current Medication list given to you today.  *If you need a refill on your cardiac medications before your next appointment, please call your pharmacy*   Lab Work: None Ordered If you have labs (blood work) drawn today and your tests are completely normal, you will receive your results only by: Marland Kitchen MyChart Message (if you have MyChart) OR . A paper copy in the mail If you have any lab test that is abnormal or we need to change your treatment, we will call you to review the results.   Testing/Procedures:  Your physician has requested that you have an echocardiogram. Echocardiography is a painless test that uses sound waves to create images of your heart. It provides your doctor with information about the size and shape of your heart and how  well your heart's chambers and valves are working. This procedure takes approximately one hour. There are no restrictions for this procedure.    Follow-Up: At [facility], you and your health needs are our priority.  As part of our continuing mission to provide you with exceptional heart care, we have created designated Provider Care Teams.  These Care Teams include your primary Cardiologist (physician) and Advanced Practice Providers (APPs -  Physician Assistants and Nurse Practitioners) who all work together to provide you with the care you need, when you need it.  We recommend signing up for the patient portal called "MyChart".  Sign up information is provided on this After Visit Summary.  MyChart is used to connect with patients for Virtual Visits (Telemedicine).  Patients are able to view lab/test results, encounter notes, upcoming appointments, etc.  Non-urgent messages can be sent to your provider as well.   To learn more about what you can do with MyChart, go to [email or web address].    Your next appointment:   Follow up after Echo   The format for your next appointment:   In Person  Provider:   Kate Sable, MD ONLY   Other Instructions      Signed, Kate Sable, MD  12/30/2019 11:27 AM    [place]

## 2019-12-30 NOTE — Patient Instructions (Signed)
Medication Instructions:   Your physician recommends that you continue on your current medications as directed. Please refer to the Current Medication list given to you today.  *If you need a refill on your cardiac medications before your next appointment, please call your pharmacy*   Lab Work: None Ordered If you have labs (blood work) drawn today and your tests are completely normal, you will receive your results only by: Marland Kitchen MyChart Message (if you have MyChart) OR . A paper copy in the mail If you have any lab test that is abnormal or we need to change your treatment, we will call you to review the results.   Testing/Procedures:  Your physician has requested that you have an echocardiogram. Echocardiography is a painless test that uses sound waves to create images of your heart. It provides your doctor with information about the size and shape of your heart and how well your heart's chambers and valves are working. This procedure takes approximately one hour. There are no restrictions for this procedure.    Follow-Up: At [facility], you and your health needs are our priority.  As part of our continuing mission to provide you with exceptional heart care, we have created designated Provider Care Teams.  These Care Teams include your primary Cardiologist (physician) and Advanced Practice Providers (APPs -  Physician Assistants and Nurse Practitioners) who all work together to provide you with the care you need, when you need it.  We recommend signing up for the patient portal called "MyChart".  Sign up information is provided on this After Visit Summary.  MyChart is used to connect with patients for Virtual Visits (Telemedicine).  Patients are able to view lab/test results, encounter notes, upcoming appointments, etc.  Non-urgent messages can be sent to your provider as well.   To learn more about what you can do with MyChart, go to [email or web address].    Your next appointment:    Follow up after Echo   The format for your next appointment:   In Person  Provider:   Kate Sable, MD ONLY   Other Instructions

## 2020-01-20 ENCOUNTER — Other Ambulatory Visit: Payer: Medicare Other

## 2020-01-24 ENCOUNTER — Ambulatory Visit: Payer: Medicare Other | Admitting: Cardiology

## 2020-01-25 ENCOUNTER — Encounter: Payer: Self-pay | Admitting: Cardiology

## 2020-02-21 DIAGNOSIS — Z23 Encounter for immunization: Secondary | ICD-10-CM | POA: Diagnosis not present

## 2020-05-11 DIAGNOSIS — J40 Bronchitis, not specified as acute or chronic: Secondary | ICD-10-CM | POA: Diagnosis not present

## 2020-05-11 DIAGNOSIS — J329 Chronic sinusitis, unspecified: Secondary | ICD-10-CM | POA: Diagnosis not present

## 2020-06-13 ENCOUNTER — Encounter: Payer: Medicare Other | Admitting: Family Medicine

## 2020-06-29 ENCOUNTER — Other Ambulatory Visit: Payer: Self-pay | Admitting: Family Medicine

## 2020-06-29 ENCOUNTER — Other Ambulatory Visit: Payer: Self-pay

## 2020-06-29 ENCOUNTER — Ambulatory Visit (INDEPENDENT_AMBULATORY_CARE_PROVIDER_SITE_OTHER): Payer: Medicare Other | Admitting: Family Medicine

## 2020-06-29 ENCOUNTER — Encounter: Payer: Self-pay | Admitting: Family Medicine

## 2020-06-29 VITALS — BP 121/77 | HR 79 | Temp 98.0°F | Ht 59.5 in | Wt 115.6 lb

## 2020-06-29 DIAGNOSIS — C50111 Malignant neoplasm of central portion of right female breast: Secondary | ICD-10-CM

## 2020-06-29 DIAGNOSIS — G8929 Other chronic pain: Secondary | ICD-10-CM | POA: Diagnosis not present

## 2020-06-29 DIAGNOSIS — R82998 Other abnormal findings in urine: Secondary | ICD-10-CM

## 2020-06-29 DIAGNOSIS — I700 Atherosclerosis of aorta: Secondary | ICD-10-CM

## 2020-06-29 DIAGNOSIS — Z170 Estrogen receptor positive status [ER+]: Secondary | ICD-10-CM | POA: Diagnosis not present

## 2020-06-29 DIAGNOSIS — R8281 Pyuria: Secondary | ICD-10-CM

## 2020-06-29 DIAGNOSIS — M5442 Lumbago with sciatica, left side: Secondary | ICD-10-CM

## 2020-06-29 DIAGNOSIS — F3342 Major depressive disorder, recurrent, in full remission: Secondary | ICD-10-CM

## 2020-06-29 DIAGNOSIS — M810 Age-related osteoporosis without current pathological fracture: Secondary | ICD-10-CM | POA: Diagnosis not present

## 2020-06-29 DIAGNOSIS — H9201 Otalgia, right ear: Secondary | ICD-10-CM

## 2020-06-29 DIAGNOSIS — I10 Essential (primary) hypertension: Secondary | ICD-10-CM | POA: Diagnosis not present

## 2020-06-29 DIAGNOSIS — Z1231 Encounter for screening mammogram for malignant neoplasm of breast: Secondary | ICD-10-CM | POA: Diagnosis not present

## 2020-06-29 LAB — MICROALBUMIN, URINE WAIVED
Creatinine, Urine Waived: 50 mg/dL (ref 10–300)
Microalb, Ur Waived: 10 mg/L (ref 0–19)
Microalb/Creat Ratio: <30 mg/g (ref <30)

## 2020-06-29 LAB — URINALYSIS, ROUTINE W REFLEX MICROSCOPIC
Bilirubin, UA: NEGATIVE
Glucose, UA: NEGATIVE
Ketones, UA: NEGATIVE
Nitrite, UA: NEGATIVE
Protein,UA: NEGATIVE
Specific Gravity, UA: <1.005 — ABNORMAL LOW (ref 1.005–1.030)
Urobilinogen, Ur: 0.2 mg/dL (ref 0.2–1.0)
pH, UA: 6 (ref 5.0–7.5)

## 2020-06-29 LAB — MICROSCOPIC EXAMINATION

## 2020-06-29 MED ORDER — SERTRALINE HCL 50 MG PO TABS
50.0000 mg | ORAL_TABLET | Freq: Every day | ORAL | 1 refills | Status: DC
Start: 2020-06-29 — End: 2021-05-29

## 2020-06-29 MED ORDER — OMEPRAZOLE 20 MG PO CPDR
20.0000 mg | DELAYED_RELEASE_CAPSULE | Freq: Every day | ORAL | 3 refills | Status: DC
Start: 2020-06-29 — End: 2021-05-29

## 2020-06-29 MED ORDER — BENAZEPRIL HCL 40 MG PO TABS: 40.0000 mg | ORAL_TABLET | Freq: Every day | ORAL | 1 refills | Status: DC

## 2020-06-29 MED ORDER — TRAMADOL HCL 50 MG PO TABS: 25.0000 mg | ORAL_TABLET | Freq: Every day | ORAL | 2 refills | Status: DC | PRN

## 2020-06-29 MED ORDER — METOPROLOL SUCCINATE ER 25 MG PO TB24: 25.0000 mg | ORAL_TABLET | Freq: Every day | ORAL | 1 refills | Status: DC

## 2020-06-29 NOTE — Progress Notes (Signed)
BP 121/77   Pulse 79   Temp 98 F (36.7 C)   Ht 4' 11.5" (1.511 m)   Wt 115 lb 9.6 oz (52.4 kg)   SpO2 98%   BMI 22.96 kg/m    Subjective:    Patient ID: Casey Summers, female    DOB: 05-13-1945, 75 y.o.   MRN: [id number]  HPI: Casey POLIMENI is a 75 y.o. female  Chief Complaint  Patient presents with  . Hypertension  . Depression  . Osteoporosis   EAR PAIN Duration: 1.5 weeks Involved ear(s): right Severity:  severe  Quality:  sharp Fever: no Otorrhea: no Upper respiratory infection symptoms: no Pruritus: no Hearing loss: yes Water immersion no Using Q-tips: no Recurrent otitis media: no Status: worse Treatments attempted: none  HYPERTENSION Hypertension status: controlled  Satisfied with current treatment? yes Duration of hypertension: chronic BP monitoring frequency:  not checking BP medication side effects:  no Medication compliance: excellent compliance Previous BP meds:benazepril and metoprolol Aspirin: no Recurrent headaches: no Visual changes: no Palpitations: no Dyspnea: no Chest pain: no Lower extremity edema: no Dizzy/lightheaded: no  DEPRESSION Mood status: controlled Satisfied with current treatment?: yes Symptom severity: mild  Duration of current treatment : chronic Side effects: no Medication compliance: excellent compliance Psychotherapy/counseling: no  Previous psychiatric medications: sertraline Depressed mood: no Anxious mood: no Anhedonia: no Significant weight loss or gain: no Insomnia: no  Fatigue: no Feelings of worthlessness or guilt: no Impaired concentration/indecisiveness: no Suicidal ideations: no Hopelessness: no Crying spells: no Depression screen [facility] 2/9 06/29/2020 12/14/2019 08/23/2019 02/22/2019 08/17/2018  Decreased Interest 0 1 0 1 0  Down, Depressed, Hopeless 1 1 0 2 0  PHQ - 2 Score 1 2 0 3 0  Altered sleeping 1 0 0 1 -  Tired, decreased energy 0 0 0 1 -  Change in appetite 0 0 0 2 -   Feeling bad or failure about yourself  0 0 0 0 -  Trouble concentrating 0 0 0 0 -  Moving slowly or fidgety/restless 0 0 0 0 -  Suicidal thoughts 0 0 0 0 -  PHQ-9 Score 2 2 0 7 -  Difficult doing work/chores Not difficult at all - Not difficult at all Somewhat difficult -   CHRONIC PAIN  Present dose: 5 Morphine equivalents Pain control status: controlled Duration: chronic Location: low back Quality: dull and aching Current Pain Level: moderate Previous Pain Level: moderate Breakthrough pain: yes Benefit from narcotic medications: yes What Activities task can be accomplished with current medication? Interested in weaning off narcotics:no   Stool softners/OTC fiber: no  Previous pain specialty evaluation: no Non-narcotic analgesic meds: no Narcotic contract: yes    Relevant past medical, surgical, family and social history reviewed and updated as indicated. Interim medical history since our last visit reviewed. Allergies and medications reviewed and updated.  Review of Systems  Constitutional: Negative.   HENT: Negative.   Respiratory: Negative.   Cardiovascular: Negative.   Gastrointestinal: Negative.   Musculoskeletal: Positive for back pain and myalgias. Negative for arthralgias, gait problem, joint swelling, neck pain and neck stiffness.  Skin: Negative.   Neurological: Negative.   Psychiatric/Behavioral: Negative.     Per HPI unless specifically indicated above     Objective:    BP 121/77   Pulse 79   Temp 98 F (36.7 C)   Ht 4' 11.5" (1.511 m)   Wt 115 lb 9.6 oz (52.4 kg)   SpO2 98%   BMI 22.96  kg/m   Wt Readings from Last 3 Encounters:  06/29/20 115 lb 9.6 oz (52.4 kg)  12/30/19 116 lb (52.6 kg)  12/14/19 115 lb 12.8 oz (52.5 kg)    Physical Exam Vitals and nursing note reviewed.  Constitutional:      General: She is not in acute distress.    Appearance: Normal appearance. She is not ill-appearing, toxic-appearing or diaphoretic.  HENT:      Head: Normocephalic and atraumatic.     Right Ear: External ear normal.     Left Ear: External ear normal.     Nose: Nose normal.     Mouth/Throat:     Mouth: Mucous membranes are moist.     Pharynx: Oropharynx is clear.  Eyes:     General: No scleral icterus.       Right eye: No discharge.        Left eye: No discharge.     Extraocular Movements: Extraocular movements intact.     Conjunctiva/sclera: Conjunctivae normal.     Pupils: Pupils are equal, round, and reactive to light.  Cardiovascular:     Rate and Rhythm: Normal rate and regular rhythm.     Pulses: Normal pulses.     Heart sounds: Normal heart sounds. No murmur heard. No friction rub. No gallop.   Pulmonary:     Effort: Pulmonary effort is normal. No respiratory distress.     Breath sounds: Normal breath sounds. No stridor. No wheezing, rhonchi or rales.  Chest:     Chest wall: No tenderness.  Musculoskeletal:        General: Normal range of motion.     Cervical back: Normal range of motion and neck supple.  Skin:    General: Skin is warm and dry.     Capillary Refill: Capillary refill takes less than 2 seconds.     Coloration: Skin is not jaundiced or pale.     Findings: No bruising, erythema, lesion or rash.  Neurological:     General: No focal deficit present.     Mental Status: She is alert and oriented to person, place, and time. Mental status is at baseline.  Psychiatric:        Mood and Affect: Mood normal.        Behavior: Behavior normal.        Thought Content: Thought content normal.        Judgment: Judgment normal.     Results for orders placed or performed during the hospital encounter of 10/12/19  CBC with Differential  Result Value Ref Range   WBC 6.0 4.0 - 10.5 K/uL   RBC 4.20 3.87 - 5.11 MIL/uL   Hemoglobin 14.6 12.0 - 15.0 g/dL   HCT 41.1 36.0 - 46.0 %   MCV 97.9 80.0 - 100.0 fL   MCH 34.8 (H) 26.0 - 34.0 pg   MCHC 35.5 30.0 - 36.0 g/dL   RDW 11.6 11.5 - 15.5 %   Platelets 218 150 -  400 K/uL   nRBC 0.0 0.0 - 0.2 %   Neutrophils Relative % 72 %   Neutro Abs 4.3 1.7 - 7.7 K/uL   Lymphocytes Relative 18 %   Lymphs Abs 1.1 0.7 - 4.0 K/uL   Monocytes Relative 9 %   Monocytes Absolute 0.6 0.1 - 1.0 K/uL   Eosinophils Relative 1 %   Eosinophils Absolute 0.1 0.0 - 0.5 K/uL   Basophils Relative 0 %   Basophils Absolute 0.0 0.0 - 0.1 K/uL  Immature Granulocytes 0 %   Abs Immature Granulocytes 0.02 0.00 - 0.07 K/uL  Comprehensive metabolic panel  Result Value Ref Range   Sodium 137 135 - 145 mmol/L   Potassium 4.0 3.5 - 5.1 mmol/L   Chloride 98 98 - 111 mmol/L   CO2 28 22 - 32 mmol/L   Glucose, Bld 104 (H) 70 - 99 mg/dL   BUN 14 8 - 23 mg/dL   Creatinine, Ser 0.62 0.44 - 1.00 mg/dL   Calcium 9.6 8.9 - 10.3 mg/dL   Total Protein 7.6 6.5 - 8.1 g/dL   Albumin 4.6 3.5 - 5.0 g/dL   AST 76 (H) 15 - 41 U/L   ALT 76 (H) 0 - 44 U/L   Alkaline Phosphatase 76 38 - 126 U/L   Total Bilirubin 0.6 0.3 - 1.2 mg/dL   GFR calc non Af Amer >60 >60 mL/min   GFR calc Af Amer >60 >60 mL/min   Anion gap 11 5 - 15  Troponin I (High Sensitivity)  Result Value Ref Range   Troponin I (High Sensitivity) <2 <18 ng/L      Assessment & Plan:   Problem List Items Addressed This Visit      Cardiovascular and Mediastinum   Hypertension - Primary    Under good control on current regimen. Continue current regimen. Continue to monitor. Call with any concerns. Refills given. Labs drawn today.       Relevant Medications   metoprolol succinate (TOPROL-XL) 25 MG 24 hr tablet   benazepril (LOTENSIN) 40 MG tablet   Other Relevant Orders   Comprehensive metabolic panel   Urinalysis, Routine w reflex microscopic   TSH   Microalbumin, Urine Waived   Aortic atherosclerosis (HCC)    Will keep BP and cholesterol under good control. Continue to monitor.       Relevant Medications   metoprolol succinate (TOPROL-XL) 25 MG 24 hr tablet   benazepril (LOTENSIN) 40 MG tablet   Other Relevant  Orders   CBC with Differential/Platelet   Comprehensive metabolic panel   Lipid Panel w/o Chol/HDL Ratio     Musculoskeletal and Integument   Age-related osteoporosis without current pathological fracture    Continue fosamax. Due for repeat DEXA in 2023. Call with any concerns.       Relevant Orders   TSH   VITAMIN D 25 Hydroxy (Vit-D Deficiency, Fractures)     Other   Low back pain    Stable on low dose tramadol. 90 pills should last her 6+ months. Refills given today. Follow up 6 months. Call with any concerns.       Relevant Medications   traMADol (ULTRAM) 50 MG tablet   Breast cancer (HCC)   Relevant Orders   MM DIAG BREAST TOMO BILATERAL   Depression    Under good control on current regimen. Continue current regimen. Continue to monitor. Call with any concerns. Refills given.        Relevant Medications   sertraline (ZOLOFT) 50 MG tablet   Other Relevant Orders   CBC with Differential/Platelet   Comprehensive metabolic panel   TSH    Other Visit Diagnoses    Right ear pain       Ear exam normal exept dry skin. Will start moisturizing. Check ESR. Has seen dentist. No jaw click. Call if not getting better or getting worse.    Relevant Orders   Sed Rate (ESR)   Encounter for screening mammogram for malignant neoplasm of breast  Mammogram ordered today.   Relevant Orders   MM DIAG BREAST TOMO BILATERAL       Follow up plan: Return in about 6 months (around 12/29/2020).

## 2020-06-29 NOTE — Assessment & Plan Note (Signed)
Stable on low dose tramadol. 90 pills should last her 6+ months. Refills given today. Follow up 6 months. Call with any concerns.

## 2020-06-29 NOTE — Assessment & Plan Note (Signed)
Under good control on current regimen. Continue current regimen. Continue to monitor. Call with any concerns. Refills given.   

## 2020-06-29 NOTE — Patient Instructions (Addendum)
Call to schedule your mammogram: [facility] at [facility]  Address: [address], [place], [place] [address]  Phone: [phone]   Health Maintenance After Age 73 After age 25, you are at a higher risk for certain long-term diseases and infections as well as injuries from falls. Falls are a major cause of broken bones and head injuries in people who are older than age 33. Getting regular preventive care can help to keep you healthy and well. Preventive care includes getting regular testing and making lifestyle changes as recommended by your health care provider. Talk with your health care provider about:  Which screenings and tests you should have. A screening is a test that checks for a disease when you have no symptoms.  A diet and exercise plan that is right for you. What should I know about screenings and tests to prevent falls? Screening and testing are the best ways to find a health problem early. Early diagnosis and treatment give you the best chance of managing medical conditions that are common after age 63. Certain conditions and lifestyle choices may make you more likely to have a fall. Your health care provider may recommend:  Regular vision checks. Poor vision and conditions such as cataracts can make you more likely to have a fall. If you wear glasses, make sure to get your prescription updated if your vision changes.  Medicine review. Work with your health care provider to regularly review all of the medicines you are taking, including over-the-counter medicines. Ask your health care provider about any side effects that may make you more likely to have a fall. Tell your health care provider if any medicines that you take make you feel dizzy or sleepy.  Osteoporosis screening. Osteoporosis is a condition that causes the bones to get weaker. This can make the bones weak and cause them to break more easily.  Blood pressure screening. Blood pressure  changes and medicines to control blood pressure can make you feel dizzy.  Strength and balance checks. Your health care provider may recommend certain tests to check your strength and balance while standing, walking, or changing positions.  Foot health exam. Foot pain and numbness, as well as not wearing proper footwear, can make you more likely to have a fall.  Depression screening. You may be more likely to have a fall if you have a fear of falling, feel emotionally low, or feel unable to do activities that you used to do.  Alcohol use screening. Using too much alcohol can affect your balance and may make you more likely to have a fall. What actions can I take to lower my risk of falls? General instructions  Talk with your health care provider about your risks for falling. Tell your health care provider if: ? You fall. Be sure to tell your health care provider about all falls, even ones that seem minor. ? You feel dizzy, sleepy, or off-balance.  Take over-the-counter and prescription medicines only as told by your health care provider. These include any supplements.  Eat a healthy diet and maintain a healthy weight. A healthy diet includes low-fat dairy products, low-fat (lean) meats, and fiber from whole grains, beans, and lots of fruits and vegetables. Home safety  Remove any tripping hazards, such as rugs, cords, and clutter.  Install safety equipment such as grab bars in bathrooms and safety rails on stairs.  Keep rooms and walkways well-lit. Activity  Follow a regular exercise program to stay fit. This  will help you maintain your balance. Ask your health care provider what types of exercise are appropriate for you.  If you need a cane or walker, use it as recommended by your health care provider.  Wear supportive shoes that have nonskid soles.   Lifestyle  Do not drink alcohol if your health care provider tells you not to drink.  If you drink alcohol, limit how much you  have: ? 0-1 drink a day for women. ? 0-2 drinks a day for men.  Be aware of how much alcohol is in your drink. In the [place] one drink equals one typical bottle of beer (12 oz), one-half glass of wine (5 oz), or one shot of hard liquor (1 oz).  Do not use any products that contain nicotine or tobacco, such as cigarettes and e-cigarettes. If you need help quitting, ask your health care provider. Summary  Having a healthy lifestyle and getting preventive care can help to protect your health and wellness after age 51.  Screening and testing are the best way to find a health problem early and help you avoid having a fall. Early diagnosis and treatment give you the best chance for managing medical conditions that are more common for people who are older than age 78.  Falls are a major cause of broken bones and head injuries in people who are older than age 29. Take precautions to prevent a fall at home.  Work with your health care provider to learn what changes you can make to improve your health and wellness and to prevent falls. This information is not intended to replace advice given to you by your health care provider. Make sure you discuss any questions you have with your health care provider. Document Revised: 05/07/2018 Document Reviewed: 11/27/2016 Elsevier Patient Education  2021 [employer].

## 2020-06-29 NOTE — Assessment & Plan Note (Signed)
Continue fosamax. Due for repeat DEXA in 2023. Call with any concerns.

## 2020-06-29 NOTE — Assessment & Plan Note (Signed)
Under good control on current regimen. Continue current regimen. Continue to monitor. Call with any concerns. Refills given. Labs drawn today.   

## 2020-06-29 NOTE — Addendum Note (Signed)
Addended by: Gerrit Halls L on: 06/29/2020 05:01 PM   Modules accepted: Orders

## 2020-06-29 NOTE — Assessment & Plan Note (Signed)
Will keep BP and cholesterol under good control. Continue to monitor.  

## 2020-06-30 ENCOUNTER — Encounter: Payer: Self-pay | Admitting: Family Medicine

## 2020-06-30 ENCOUNTER — Telehealth: Payer: Self-pay

## 2020-06-30 LAB — COMPREHENSIVE METABOLIC PANEL
ALT: 46 IU/L — ABNORMAL HIGH (ref 0–32)
AST: 43 IU/L — ABNORMAL HIGH (ref 0–40)
Albumin/Globulin Ratio: 2.2 (ref 1.2–2.2)
Albumin: 4.7 g/dL (ref 3.7–4.7)
Alkaline Phosphatase: 95 IU/L (ref 44–121)
BUN/Creatinine Ratio: 13 (ref 12–28)
BUN: 10 mg/dL (ref 8–27)
Bilirubin Total: 0.7 mg/dL (ref 0.0–1.2)
CO2: 22 mmol/L (ref 20–29)
Calcium: 9.5 mg/dL (ref 8.7–10.3)
Chloride: 96 mmol/L (ref 96–106)
Creatinine, Ser: 0.76 mg/dL (ref 0.57–1.00)
Globulin, Total: 2.1 g/dL (ref 1.5–4.5)
Glucose: 96 mg/dL (ref 65–99)
Potassium: 4.5 mmol/L (ref 3.5–5.2)
Sodium: 137 mmol/L (ref 134–144)
Total Protein: 6.8 g/dL (ref 6.0–8.5)
eGFR: 83 mL/min/{1.73_m2} (ref >59)

## 2020-06-30 LAB — CBC WITH DIFFERENTIAL/PLATELET
Basophils Absolute: 0 10*3/uL (ref 0.0–0.2)
Basos: 0 %
EOS (ABSOLUTE): 0.1 10*3/uL (ref 0.0–0.4)
Eos: 1 %
Hematocrit: 44.8 % (ref 34.0–46.6)
Hemoglobin: 15.4 g/dL (ref 11.1–15.9)
Immature Grans (Abs): 0 10*3/uL (ref 0.0–0.1)
Immature Granulocytes: 0 %
Lymphocytes Absolute: 1.1 10*3/uL (ref 0.7–3.1)
Lymphs: 20 %
MCH: 34.6 pg — ABNORMAL HIGH (ref 26.6–33.0)
MCHC: 34.4 g/dL (ref 31.5–35.7)
MCV: 101 fL — ABNORMAL HIGH (ref 79–97)
Monocytes Absolute: 0.6 10*3/uL (ref 0.1–0.9)
Monocytes: 10 %
Neutrophils Absolute: 3.7 10*3/uL (ref 1.4–7.0)
Neutrophils: 69 %
Platelets: 164 10*3/uL (ref 150–450)
RBC: 4.45 x10E6/uL (ref 3.77–5.28)
RDW: 11.9 % (ref 11.7–15.4)
WBC: 5.4 10*3/uL (ref 3.4–10.8)

## 2020-06-30 LAB — LIPID PANEL W/O CHOL/HDL RATIO
Cholesterol, Total: 225 mg/dL — ABNORMAL HIGH (ref 100–199)
HDL: 79 mg/dL (ref >39)
LDL Chol Calc (NIH): 126 mg/dL — ABNORMAL HIGH (ref 0–99)
Triglycerides: 113 mg/dL (ref 0–149)
VLDL Cholesterol Cal: 20 mg/dL (ref 5–40)

## 2020-06-30 LAB — TSH: TSH: 1.29 u[IU]/mL (ref 0.450–4.500)

## 2020-06-30 LAB — VITAMIN D 25 HYDROXY (VIT D DEFICIENCY, FRACTURES): Vit D, 25-Hydroxy: 76.6 ng/mL (ref 30.0–100.0)

## 2020-06-30 LAB — SEDIMENTATION RATE: Sed Rate: 2 mm/hr (ref 0–40)

## 2020-06-30 NOTE — Telephone Encounter (Signed)
Called and left a detailed message for patient.  

## 2020-06-30 NOTE — Telephone Encounter (Signed)
Copied from [place] [phone]. Topic: General - Other >> Jun 30, 2020  9:49 AM Valere Dross wrote: Reason for CRM: Pt called in stating she forgot to mention yesterday at her appt, have she also has a lump on her upper right thigh, pt states she was not sure if it had anything to do with the head pain she has been having. Pt wanted to inform PCP. Please advise

## 2020-06-30 NOTE — Telephone Encounter (Signed)
It's unlikely too- we can look at it next time she comes in.

## 2020-07-01 LAB — URINE CULTURE

## 2020-07-05 DIAGNOSIS — H6981 Other specified disorders of Eustachian tube, right ear: Secondary | ICD-10-CM | POA: Diagnosis not present

## 2020-07-05 DIAGNOSIS — H903 Sensorineural hearing loss, bilateral: Secondary | ICD-10-CM | POA: Diagnosis not present

## 2020-08-04 ENCOUNTER — Telehealth: Payer: Self-pay | Admitting: Family Medicine

## 2020-08-04 NOTE — Telephone Encounter (Signed)
Copied from [place] [phone]. Topic: Medicare AWV >> Aug 04, 2020  1:48 PM Cher Nakai R wrote: Reason for CRM:  Left message [facility] schedule change- AWVS rescheduled to August 25, 2020 at 10:30 by phone -srs

## 2020-08-23 ENCOUNTER — Ambulatory Visit: Payer: Medicare Other

## 2020-08-24 DIAGNOSIS — Z1231 Encounter for screening mammogram for malignant neoplasm of breast: Secondary | ICD-10-CM | POA: Diagnosis not present

## 2020-08-24 LAB — HM MAMMOGRAPHY

## 2020-08-25 ENCOUNTER — Telehealth: Payer: Self-pay

## 2020-08-25 ENCOUNTER — Ambulatory Visit: Payer: Medicare Other

## 2020-08-25 NOTE — Telephone Encounter (Signed)
This nurse called patient in order to do our scheduled telephonic AWV. Patient states that this is not a good time because she is at the [facility]. She would like [place] to call at another time to reschedule.

## 2020-09-20 ENCOUNTER — Ambulatory Visit: Payer: Self-pay | Admitting: *Deleted

## 2020-09-20 NOTE — Telephone Encounter (Signed)
3 attempts to reach pt, unable to leave messages, VM full

## 2020-10-20 ENCOUNTER — Ambulatory Visit (INDEPENDENT_AMBULATORY_CARE_PROVIDER_SITE_OTHER): Payer: Medicare Other | Admitting: Family Medicine

## 2020-10-20 ENCOUNTER — Other Ambulatory Visit: Payer: Self-pay

## 2020-10-20 ENCOUNTER — Encounter: Payer: Self-pay | Admitting: Family Medicine

## 2020-10-20 VITALS — BP 158/76 | HR 65 | Temp 98.5°F | Wt 114.8 lb

## 2020-10-20 DIAGNOSIS — Z23 Encounter for immunization: Secondary | ICD-10-CM | POA: Diagnosis not present

## 2020-10-20 DIAGNOSIS — Z780 Asymptomatic menopausal state: Secondary | ICD-10-CM | POA: Diagnosis not present

## 2020-10-20 DIAGNOSIS — I10 Essential (primary) hypertension: Secondary | ICD-10-CM

## 2020-10-20 DIAGNOSIS — R519 Headache, unspecified: Secondary | ICD-10-CM | POA: Diagnosis not present

## 2020-10-20 DIAGNOSIS — Z0000 Encounter for general adult medical examination without abnormal findings: Secondary | ICD-10-CM | POA: Diagnosis not present

## 2020-10-20 MED ORDER — HYDROCHLOROTHIAZIDE 25 MG PO TABS: 25.0000 mg | ORAL_TABLET | Freq: Every day | ORAL | 3 refills | Status: DC

## 2020-10-20 MED ORDER — NORTRIPTYLINE HCL 25 MG PO CAPS: 25.0000 mg | ORAL_CAPSULE | Freq: Every day | ORAL | 3 refills | Status: DC

## 2020-10-20 NOTE — Assessment & Plan Note (Signed)
Not under good control. Will add hctz and recheck 1 month. Call with any concerns. Continue to monitor.

## 2020-10-20 NOTE — Progress Notes (Signed)
BP (!) 158/76 (BP Location: Left Arm, Cuff Size: Normal)   Pulse 65   Temp 98.5 F (36.9 C) (Oral)   Wt 114 lb 12.8 oz (52.1 kg)   SpO2 98%   BMI 22.80 kg/m    Subjective:    Patient ID: Casey Summers, female    DOB: Nov 26, 1945, 75 y.o.   MRN: [id number]  HPI: Casey Summers is a 75 y.o. female presenting on 10/20/2020 for comprehensive medical examination. Current medical complaints include:  Has been having headaches for about 4-5 months. In random spots in her head on her forehead and parietal. No changes in her vision. No numbness or tingling. No nausea. Seeing eye doctor on Monday. They are happening daily. They happen at random. Last most of the day. Aleve helps a little bit. Nothing makes them worse.   HYPERTENSION Hypertension status: uncontrolled  Satisfied with current treatment? no Duration of hypertension: chronic BP monitoring frequency:  rarely BP medication side effects:  no Medication compliance: excellent compliance Previous BP meds:metoprolol, benazepril Aspirin: yes Recurrent headaches: yes Visual changes: no Palpitations: no Dyspnea: no Chest pain: no Lower extremity edema: no Dizzy/lightheaded: no  Functional Status Survey: Is the patient deaf or have difficulty hearing?: No Does the patient have difficulty seeing, even when wearing glasses/contacts?: Yes Does the patient have difficulty concentrating, remembering, or making decisions?: No Does the patient have difficulty walking or climbing stairs?: No Does the patient have difficulty dressing or bathing?: No Does the patient have difficulty doing errands alone such as visiting a doctor's office or shopping?: No  Fall Risk  10/20/2020 06/29/2020 08/23/2019 02/22/2019 08/17/2018  Falls in the past year? 0 0 0 0 0  Number falls in past yr: 0 0 - 0 -  Injury with Fall? 0 0 - 0 -  Risk for fall due to : - No Fall Risks Medication side effect - -  Follow up - Falls evaluation completed Falls  evaluation completed;Education provided;Falls prevention discussed - -    Depression Screen Depression screen [facility] 2/9 10/20/2020 06/29/2020 12/14/2019 08/23/2019 02/22/2019  Decreased Interest 0 0 1 0 1  Down, Depressed, Hopeless 0 1 1 0 2  PHQ - 2 Score 0 1 2 0 3  Altered sleeping - 1 0 0 1  Tired, decreased energy - 0 0 0 1  Change in appetite - 0 0 0 2  Feeling bad or failure about yourself  - 0 0 0 0  Trouble concentrating - 0 0 0 0  Moving slowly or fidgety/restless - 0 0 0 0  Suicidal thoughts - 0 0 0 0  PHQ-9 Score - 2 2 0 7  Difficult doing work/chores - Not difficult at all - Not difficult at all Somewhat difficult    Advanced Directives Does patient have a HCPOA?    no Does patient have a living will or MOST form?  no  Past Medical History:  Past Medical History:  Diagnosis Date   Allergy    Cancer ([place])    breast   Chronic mid back pain    Depression    Hypertension    Osteoporosis     Surgical History:  Past Surgical History:  Procedure Laterality Date   BREAST LUMPECTOMY  01/29/2008   TOTAL ABDOMINAL HYSTERECTOMY W/ BILATERAL SALPINGOOPHORECTOMY      Medications:  Current Outpatient Medications on File Prior to Visit  Medication Sig   alendronate (FOSAMAX) 70 MG tablet Take 1 tablet (70 mg total) by mouth  every 7 (seven) days. Take with a full glass of water on an empty stomach.   aspirin 81 MG tablet Take 81 mg by mouth.    benazepril (LOTENSIN) 40 MG tablet Take 1 tablet (40 mg total) by mouth daily.   Cholecalciferol 25 MCG (1000 UT) tablet Take by mouth.   metoprolol succinate (TOPROL-XL) 25 MG 24 hr tablet Take 1 tablet (25 mg total) by mouth daily.   naproxen (NAPROSYN) 375 MG tablet Take 1 tablet (375 mg total) by mouth 2 (two) times daily as needed for moderate pain.   omeprazole (PRILOSEC) 20 MG capsule Take 1 capsule (20 mg total) by mouth daily.   sertraline (ZOLOFT) 50 MG tablet Take 1 tablet (50 mg total) by mouth daily.   traMADol (ULTRAM) 50  MG tablet Take 0.5-1 tablets (25-50 mg total) by mouth daily as needed.   No current facility-administered medications on file prior to visit.    Allergies:  Allergies  Allergen Reactions   Iron Metal [Iron] Rash    nickel    Social History:  Social History   Socioeconomic History   Marital status: Divorced    Spouse name: Not on file   Number of children: Not on file   Years of education: Not on file   Highest education level: Associate degree: academic program  Occupational History   Occupation: retired  Tobacco Use   Smoking status: Former    Packs/day: 1.25    Years: 24.00    Pack years: 30.00    Types: Cigarettes    Quit date: 08/28/2008    Years since quitting: 12.1   Smokeless tobacco: Never  Vaping Use   Vaping Use: Never used  Substance and Sexual Activity   Alcohol use: Yes    Alcohol/week: 14.0 standard drinks    Types: 14 Glasses of wine per week   Drug use: No   Sexual activity: Not Currently  Other Topics Concern   Not on file  Social History Narrative   Not on file   Social Determinants of Health   Financial Resource Strain: Not on file  Food Insecurity: Not on file  Transportation Needs: Not on file  Physical Activity: Not on file  Stress: Not on file  Social Connections: Not on file  Intimate Partner Violence: Not on file   Social History   Tobacco Use  Smoking Status Former   Packs/day: 1.25   Years: 24.00   Pack years: 30.00   Types: Cigarettes   Quit date: 08/28/2008   Years since quitting: 12.1  Smokeless Tobacco Never   Social History   Substance and Sexual Activity  Alcohol Use Yes   Alcohol/week: 14.0 standard drinks   Types: 14 Glasses of wine per week    Family History:  Family History  Problem Relation Age of Onset   Congestive Heart Failure Mother    Cancer Sister        breast    Past medical history, surgical history, medications, allergies, family history and social history reviewed with patient today and  changes made to appropriate areas of the chart.   Review of Systems  Constitutional: Negative.   Respiratory: Negative.    Cardiovascular: Negative.   Genitourinary: Negative.   Musculoskeletal: Negative.   Neurological:  Positive for headaches. Negative for dizziness, tingling, tremors, sensory change, speech change, focal weakness, seizures, loss of consciousness and weakness.  Psychiatric/Behavioral: Negative.     All other ROS negative except what is listed above and in the  HPI.      Objective:    BP (!) 158/76 (BP Location: Left Arm, Cuff Size: Normal)   Pulse 65   Temp 98.5 F (36.9 C) (Oral)   Wt 114 lb 12.8 oz (52.1 kg)   SpO2 98%   BMI 22.80 kg/m   Wt Readings from Last 3 Encounters:  10/20/20 114 lb 12.8 oz (52.1 kg)  06/29/20 115 lb 9.6 oz (52.4 kg)  12/30/19 116 lb (52.6 kg)    Physical Exam Vitals and nursing note reviewed.  Constitutional:      General: She is not in acute distress.    Appearance: Normal appearance. She is not ill-appearing, toxic-appearing or diaphoretic.  HENT:     Head: Normocephalic and atraumatic.     Right Ear: External ear normal.     Left Ear: External ear normal.     Nose: Nose normal.     Mouth/Throat:     Mouth: Mucous membranes are moist.     Pharynx: Oropharynx is clear.  Eyes:     General: No scleral icterus.       Right eye: No discharge.        Left eye: No discharge.     Extraocular Movements: Extraocular movements intact.     Conjunctiva/sclera: Conjunctivae normal.     Pupils: Pupils are equal, round, and reactive to light.  Cardiovascular:     Rate and Rhythm: Normal rate and regular rhythm.     Pulses: Normal pulses.     Heart sounds: Normal heart sounds. No murmur heard.   No friction rub. No gallop.  Pulmonary:     Effort: Pulmonary effort is normal. No respiratory distress.     Breath sounds: Normal breath sounds. No stridor. No wheezing, rhonchi or rales.  Chest:     Chest wall: No tenderness.   Musculoskeletal:        General: Normal range of motion.     Cervical back: Normal range of motion and neck supple.  Skin:    General: Skin is warm and dry.     Capillary Refill: Capillary refill takes less than 2 seconds.     Coloration: Skin is not jaundiced or pale.     Findings: No bruising, erythema, lesion or rash.  Neurological:     General: No focal deficit present.     Mental Status: She is alert and oriented to person, place, and time. Mental status is at baseline.  Psychiatric:        Mood and Affect: Mood normal.        Behavior: Behavior normal.        Thought Content: Thought content normal.        Judgment: Judgment normal.    6CIT Screen 10/20/2020 08/23/2019 08/17/2018 06/30/2017  What Year? 0 points 0 points 0 points 0 points  What month? 0 points 0 points 0 points 0 points  What time? 0 points 0 points 0 points 0 points  Count back from 20 0 points 0 points 0 points 0 points  Months in reverse 0 points 0 points 0 points 0 points  Repeat phrase 0 points 4 points - 0 points  Total Score 0 4 - 0    Results for orders placed or performed in visit on 10/20/20  HM MAMMOGRAPHY  Result Value Ref Range   HM Mammogram 0-4 Bi-Rad 0-4 Bi-Rad, Self Reported Normal      Assessment & Plan:   Problem List Items Addressed This Visit  Cardiovascular and Mediastinum   Hypertension    Not under good control. Will add hctz and recheck 1 month. Call with any concerns. Continue to monitor.       Relevant Medications   hydrochlorothiazide (HYDRODIURIL) 25 MG tablet   Other Visit Diagnoses     Encounter for annual wellness exam in Medicare patient    -  Primary   Preventative care discussed today as below. Call with any concerns.    Nonintractable headache, unspecified chronicity pattern, unspecified headache type       Not under good control. Will check tick diseases, treat blood pressure and start nortriptyline. Recheck 1 month. Call with any concerns.    Relevant  Medications   nortriptyline (PAMELOR) 25 MG capsule   Other Relevant Orders   Lyme Disease Serology w/Reflex   Rocky mtn spotted fvr abs pnl(IgG+IgM)   Babesia microti Antibody Panel   Ehrlichia Antibody Panel   Postmenopausal estrogen deficiency       DEXA ordered today.   Relevant Orders   DG Bone Density   Need for influenza vaccination       Flu shot given today.   Relevant Orders   Flu Vaccine QUAD High Dose(Fluad) (Completed)        Preventative Services:  Health Risk Assessment and Personalized Prevention Plan: Done today Bone Mass Measurements: Ordered today Breast Cancer Screening: Up to date CVD Screening: up to date Cervical Cancer Screening: N/A Colon Cancer Screening: up to date Depression Screening: Done today Diabetes Screening: up to date Glaucoma Screening: see your eye doctor Hepatitis B vaccine: N/A Hepatitis C screening: up to date HIV Screening:up to date Flu Vaccine: done today Lung cancer Screening:N/A Obesity Screening: done today Pneumonia Vaccines (2): up to date STI Screening: N/A  Follow up plan: Return in about 4 weeks (around 11/17/2020), or virtual.   LABORATORY TESTING:  - Pap smear: not applicable  IMMUNIZATIONS:   - Tdap: Tetanus vaccination status reviewed: last tetanus booster within 10 years. - Influenza: Up to date - Pneumovax: Up to date - Prevnar: Up to date - Zostavax vaccine: Given elsewhere  SCREENING: -Mammogram: Up to date  - Colonoscopy: Up to date  - Bone Density: Ordered today   PATIENT COUNSELING:   Advised to take 1 mg of folate supplement per day if capable of pregnancy.   Sexuality: Discussed sexually transmitted diseases, partner selection, use of condoms, avoidance of unintended pregnancy  and contraceptive alternatives.   Advised to avoid cigarette smoking.  I discussed with the patient that most people either abstain from alcohol or drink within safe limits (<=14/week and <=4 drinks/occasion for  males, <=7/weeks and <= 3 drinks/occasion for females) and that the risk for alcohol disorders and other health effects rises proportionally with the number of drinks per week and how often a drinker exceeds daily limits.  Discussed cessation/primary prevention of drug use and availability of treatment for abuse.   Diet: Encouraged to adjust caloric intake to maintain  or achieve ideal body weight, to reduce intake of dietary saturated fat and total fat, to limit sodium intake by avoiding high sodium foods and not adding table salt, and to maintain adequate dietary potassium and calcium preferably from fresh fruits, vegetables, and low-fat dairy products.    stressed the importance of regular exercise  Injury prevention: Discussed safety belts, safety helmets, smoke detector, smoking near bedding or upholstery.   Dental health: Discussed importance of regular tooth brushing, flossing, and dental visits.    NEXT PREVENTATIVE  PHYSICAL DUE IN 1 YEAR. Return in about 4 weeks (around 11/17/2020), or virtual.

## 2020-10-20 NOTE — Patient Instructions (Signed)
Preventative Services:  Health Risk Assessment and Personalized Prevention Plan: Done today Bone Mass Measurements: Ordered today Breast Cancer Screening: Up to date CVD Screening: up to date Cervical Cancer Screening: N/A Colon Cancer Screening: up to date Depression Screening: Done today Diabetes Screening: up to date Glaucoma Screening: see your eye doctor Hepatitis B vaccine: N/A Hepatitis C screening: up to date HIV Screening:up to date Flu Vaccine: done today Lung cancer Screening:N/A Obesity Screening: done today Pneumonia Vaccines (2): up to date STI Screening: N/A

## 2020-10-23 DIAGNOSIS — H25813 Combined forms of age-related cataract, bilateral: Secondary | ICD-10-CM | POA: Diagnosis not present

## 2020-10-25 ENCOUNTER — Encounter: Payer: Self-pay | Admitting: Family Medicine

## 2020-10-25 LAB — EHRLICHIA ANTIBODY PANEL
E. Chaffeensis (HME) IgM Titer: NEGATIVE
E.Chaffeensis (HME) IgG: NEGATIVE
HGE IgG Titer: NEGATIVE
HGE IgM Titer: NEGATIVE

## 2020-10-25 LAB — BABESIA MICROTI ANTIBODY PANEL
Babesia microti IgG: <1:10 {titer}
Babesia microti IgM: <1:10 {titer}

## 2020-10-25 LAB — ROCKY MTN SPOTTED FVR ABS PNL(IGG+IGM)
RMSF IgG: NEGATIVE
RMSF IgM: 0.42 index (ref 0.00–0.89)

## 2020-10-25 LAB — LYME DISEASE SEROLOGY W/REFLEX: Lyme Total Antibody EIA: NEGATIVE

## 2020-11-12 ENCOUNTER — Other Ambulatory Visit: Payer: Self-pay | Admitting: Family Medicine

## 2020-11-12 DIAGNOSIS — G8929 Other chronic pain: Secondary | ICD-10-CM

## 2020-11-12 NOTE — Telephone Encounter (Signed)
Requested medication (s) are due for refill today: yes  Requested medication (s) are on the active medication list: yes  Last refill:  06/29/20   Future visit scheduled: yes  Notes to clinic:  med not delegated to NT to RF No UDS in last 360 days   Requested Prescriptions  Pending Prescriptions Disp Refills   traMADol (ULTRAM) 50 MG tablet [Pharmacy Med Name: TRAMADOL HCL 50 MG TABLET] 30 tablet 2    Sig: Take 0.5-1 tablets (25-50 mg total) by mouth daily as needed.     Not Delegated - Analgesics:  Opioid Agonists Failed - 11/12/2020 11:34 AM      Failed - This refill cannot be delegated      Failed - Urine Drug Screen completed in last 360 days      Passed - Valid encounter within last 6 months    Recent Outpatient Visits           3 weeks ago Encounter for annual wellness exam in Medicare patient   [place], [place], DO   4 months ago Primary hypertension   [place], Megan P, DO   11 months ago Chronic left-sided low back pain with left-sided sciatica   [place], DO   1 year ago Chest pain, unspecified type   [place], [place], DO   1 year ago Chronic left-sided low back pain with left-sided sciatica   [facility] Valerie Roys, DO       Future Appointments             In 1 week Wynetta Emery, Barb Merino, DO [facility], [place]   In 1 month [place], Barb Merino, DO [facility], [facility]

## 2020-11-13 ENCOUNTER — Other Ambulatory Visit: Payer: Self-pay | Admitting: Family Medicine

## 2020-11-13 NOTE — Telephone Encounter (Signed)
Requested Prescriptions  Pending Prescriptions Disp Refills  . nortriptyline (PAMELOR) 25 MG capsule [Pharmacy Med Name: NORTRIPTYLINE HCL 25 MG CAP] 90 capsule 1    Sig: TAKE 1 CAPSULE BY MOUTH AT BEDTIME.     Psychiatry:  Antidepressants - Heterocyclics (TCAs) Passed - 11/13/2020 12:32 PM      Passed - Completed PHQ-2 or PHQ-9 in the last 360 days      Passed - Valid encounter within last 6 months    Recent Outpatient Visits          3 weeks ago Encounter for annual wellness exam in Medicare patient   [place], [place], DO   4 months ago Primary hypertension   [place], Megan P, DO   11 months ago Chronic left-sided low back pain with left-sided sciatica   [place], DO   1 year ago Chest pain, unspecified type   [place], Megan P, DO   1 year ago Chronic left-sided low back pain with left-sided sciatica   [facility] Valerie Roys, DO      Future Appointments            In 1 week Wynetta Emery, Barb Merino, DO [facility], [place]   In 1 month [place], Barb Merino, DO [facility], [facility]

## 2020-11-15 ENCOUNTER — Telehealth: Payer: Self-pay

## 2020-11-15 ENCOUNTER — Other Ambulatory Visit: Payer: Self-pay | Admitting: Family Medicine

## 2020-11-15 DIAGNOSIS — G8929 Other chronic pain: Secondary | ICD-10-CM

## 2020-11-15 MED ORDER — TRAMADOL HCL 50 MG PO TABS: 25.0000 mg | ORAL_TABLET | Freq: Every day | ORAL | 0 refills | Status: DC | PRN

## 2020-11-15 NOTE — Telephone Encounter (Signed)
Copied from [place] [phone]. Topic: General - Other >> Nov 13, 2020 10:18 AM Loma [place] wrote: Reason for CRM: traMADol (ULTRAM) 50 MG tablet 30 tablet 2 06/29/2020   Sig - Route: Take 0.5-1 tablets (25-50 mg total) by mouth daily as needed. - Oral  Sent to pharmacy as: traMADol (ULTRAM) 50 MG tablet  E-Prescribing Status: Receipt confirmed by pharmacy (06/29/2020 2:30 PM EDT)   [phone] pt confused as turned down  as need appt, pt was just seen the end of Sept, and states Dr Lenna Sciara would refill med above and lab work done also, fu to advise pt

## 2020-11-15 NOTE — Telephone Encounter (Signed)
She is due for an appointment to go over her blood pressure any time now. I'll get her a refill for this month until I see her. Thanks!

## 2020-11-15 NOTE — Telephone Encounter (Signed)
Called to speak with patient about refill request, unable to leave message due to the Mailbox being full and unable to take messages.

## 2020-11-21 ENCOUNTER — Telehealth: Payer: Medicare Other | Admitting: Family Medicine

## 2020-11-22 NOTE — Telephone Encounter (Signed)
Patient has appointment scheduled for 01/08/2021.

## 2020-12-29 ENCOUNTER — Ambulatory Visit: Payer: Medicare Other | Admitting: Family Medicine

## 2021-01-08 ENCOUNTER — Ambulatory Visit: Payer: Medicare Other | Admitting: Family Medicine

## 2021-01-13 ENCOUNTER — Other Ambulatory Visit: Payer: Self-pay | Admitting: Family Medicine

## 2021-01-14 NOTE — Telephone Encounter (Signed)
Requested Prescriptions  Pending Prescriptions Disp Refills   alendronate (FOSAMAX) 70 MG tablet [Pharmacy Med Name: ALENDRONATE SODIUM 70 MG TAB] 12 tablet 1    Sig: TAKE 1 TABLET EVERY 7 DAYS. TAKE WITH A FULL GLASS OF WATER ON AN EMPTY STOMACH.     Endocrinology:  Bisphosphonates Passed - 01/13/2021 10:14 AM      Passed - Ca in normal range and within 360 days    Calcium  Date Value Ref Range Status  06/29/2020 9.5 8.7 - 10.3 mg/dL Final         Passed - Vitamin D in normal range and within 360 days    Vit D, 25-Hydroxy  Date Value Ref Range Status  06/29/2020 76.6 30.0 - 100.0 ng/mL Final    Comment:    Vitamin D deficiency has been defined by the [place] practice guideline as a level of serum 25-OH vitamin D less than 20 ng/mL (1,2). The Endocrine Society went on to further define vitamin D insufficiency as a level between 21 and 29 ng/mL (2). 1. IOM (Institute of Medicine). 2010. Dietary reference    intakes for calcium and D. [place]: The    [employer]. 2. Holick MF, Binkley Mondamin, Bischoff-Ferrari HA, et al.    Evaluation, treatment, and prevention of vitamin D    deficiency: an Endocrine Society clinical practice    guideline. JCEM. 2011 Jul; 96(7):1911-30.          Passed - Valid encounter within last 12 months    Recent Outpatient Visits          2 months ago Encounter for annual wellness exam in Medicare patient   [place], [place], DO   6 months ago Primary hypertension   [place] P, DO   1 year ago Chronic left-sided low back pain with left-sided sciatica   [place], DO   1 year ago Chest pain, unspecified type   [place], DO   1 year ago Chronic left-sided low back pain with left-sided sciatica   [facility], Megan P, DO

## 2021-04-11 ENCOUNTER — Other Ambulatory Visit: Payer: Self-pay | Admitting: Family Medicine

## 2021-04-11 DIAGNOSIS — I10 Essential (primary) hypertension: Secondary | ICD-10-CM

## 2021-04-11 NOTE — Telephone Encounter (Signed)
Pt scheduled for 5/2 on 3/10 ?

## 2021-04-11 NOTE — Telephone Encounter (Signed)
Requested medication (s) are due for refill today: Yes ? ?Requested medication (s) are on the active medication list: Yes ? ?Last refill:  06/29/20 ? ?Future visit scheduled: Yes ? ?Notes to clinic:  Protocol indicates pt. Needs lab work. ? ? ? ?Requested Prescriptions  ?Pending Prescriptions Disp Refills  ? benazepril (LOTENSIN) 40 MG tablet [Pharmacy Med Name: BENAZEPRIL HCL 40 MG TABLET] 90 tablet 1  ?  Sig: TAKE 1 TABLET BY MOUTH EVERY DAY  ?  ? Cardiovascular:  ACE Inhibitors Failed - 04/11/2021  1:59 AM  ?  ?  Failed - Cr in normal range and within 180 days  ?  Creatinine  ?Date Value Ref Range Status  ?02/22/2019 116.9 20.0 - 300.0 mg/dL Final  ? ?Creatinine, Ser  ?Date Value Ref Range Status  ?06/29/2020 0.76 0.57 - 1.00 mg/dL Final  ?  ?  ?  ?  Failed - K in normal range and within 180 days  ?  Potassium  ?Date Value Ref Range Status  ?06/29/2020 4.5 3.5 - 5.2 mmol/L Final  ?  ?  ?  ?  Failed - Last BP in normal range  ?  BP Readings from Last 1 Encounters:  ?10/20/20 (!) 158/76  ?  ?  ?  ?  Passed - Patient is not pregnant  ?  ?  Passed - Valid encounter within last 6 months  ?  Recent Outpatient Visits   ? ?      ? 5 months ago Encounter for annual wellness exam in Medicare patient  ? [place], Megan P, DO  ? 9 months ago Primary hypertension  ? [place], [place] P, DO  ? 1 year ago Chronic left-sided low back pain with left-sided sciatica  ? [place] P, DO  ? 1 year ago Chest pain, unspecified type  ? [place], [place] P, DO  ? 1 year ago Chronic left-sided low back pain with left-sided sciatica  ? [place], [place] P, DO  ? ?  ?  ?Future Appointments   ? ?        ? In 1 month [place], Barb Merino, DO [facility], [facility]  ? ?  ? ?  ?  ?  ? ?

## 2021-04-12 NOTE — Telephone Encounter (Signed)
Courtesy supply sent to get patient to appt. ?

## 2021-04-26 ENCOUNTER — Other Ambulatory Visit: Payer: Self-pay | Admitting: Nurse Practitioner

## 2021-04-26 DIAGNOSIS — I10 Essential (primary) hypertension: Secondary | ICD-10-CM

## 2021-04-26 NOTE — Telephone Encounter (Signed)
Requested medications are due for refill today.  Pt should have enough to get to appointment ? ?Requested medications are on the active medications list.  yes ? ?Last refill. 04/12/2021 #45 0 refills ? ?Future visit scheduled.   yes ? ?Notes to clinic.  Pt last seen 10/20/2020 - last refill was a courtesy refill. Patient should have enough medication to last until office visit. ? ? ? ?Requested Prescriptions  ?Pending Prescriptions Disp Refills  ? benazepril (LOTENSIN) 40 MG tablet [Pharmacy Med Name: BENAZEPRIL HCL 40 MG TABLET] 90 tablet 1  ?  Sig: TAKE 1 TABLET BY MOUTH EVERY DAY  ?  ? Cardiovascular:  ACE Inhibitors Failed - 04/26/2021  9:07 AM  ?  ?  Failed - Cr in normal range and within 180 days  ?  Creatinine  ?Date Value Ref Range Status  ?02/22/2019 116.9 20.0 - 300.0 mg/dL Final  ? ?Creatinine, Ser  ?Date Value Ref Range Status  ?06/29/2020 0.76 0.57 - 1.00 mg/dL Final  ?  ?  ?  ?  Failed - K in normal range and within 180 days  ?  Potassium  ?Date Value Ref Range Status  ?06/29/2020 4.5 3.5 - 5.2 mmol/L Final  ?  ?  ?  ?  Failed - Last BP in normal range  ?  BP Readings from Last 1 Encounters:  ?10/20/20 (!) 158/76  ?  ?  ?  ?  Failed - Valid encounter within last 6 months  ?  Recent Outpatient Visits   ? ?      ? 6 months ago Encounter for annual wellness exam in Medicare patient  ? [place], Megan P, DO  ? 10 months ago Primary hypertension  ? [place], [place] P, DO  ? 1 year ago Chronic left-sided low back pain with left-sided sciatica  ? [place] P, DO  ? 1 year ago Chest pain, unspecified type  ? [place], [place] P, DO  ? 1 year ago Chronic left-sided low back pain with left-sided sciatica  ? [place], [place] P, DO  ? ?  ?  ?Future Appointments   ? ?        ? In 1 month [place], Barb Merino, DO [place]  ? ?  ? ?  ?  ?  Passed - Patient is not pregnant  ?  ?  ?  ?

## 2021-05-29 ENCOUNTER — Encounter: Payer: Self-pay | Admitting: Family Medicine

## 2021-05-29 ENCOUNTER — Ambulatory Visit (INDEPENDENT_AMBULATORY_CARE_PROVIDER_SITE_OTHER): Payer: Medicare Other | Admitting: Family Medicine

## 2021-05-29 ENCOUNTER — Ambulatory Visit
Admission: RE | Admit: 2021-05-29 | Discharge: 2021-05-29 | Disposition: A | Discharge status: Home / Self Care | Payer: Medicare Other | Attending: Family Medicine | Admitting: Family Medicine

## 2021-05-29 ENCOUNTER — Ambulatory Visit
Admission: RE | Admit: 2021-05-29 | Discharge: 2021-05-29 | Disposition: A | Discharge status: Home / Self Care | Payer: Medicare Other | Source: Ambulatory Visit | Attending: Family Medicine | Admitting: Family Medicine

## 2021-05-29 VITALS — BP 137/83 | HR 68 | Temp 97.9°F | Wt 114.0 lb

## 2021-05-29 DIAGNOSIS — I700 Atherosclerosis of aorta: Secondary | ICD-10-CM | POA: Diagnosis not present

## 2021-05-29 DIAGNOSIS — I10 Essential (primary) hypertension: Secondary | ICD-10-CM | POA: Diagnosis not present

## 2021-05-29 DIAGNOSIS — F3342 Major depressive disorder, recurrent, in full remission: Secondary | ICD-10-CM | POA: Diagnosis not present

## 2021-05-29 DIAGNOSIS — R053 Chronic cough: Secondary | ICD-10-CM

## 2021-05-29 DIAGNOSIS — M5442 Lumbago with sciatica, left side: Secondary | ICD-10-CM

## 2021-05-29 DIAGNOSIS — G8929 Other chronic pain: Secondary | ICD-10-CM

## 2021-05-29 DIAGNOSIS — R059 Cough, unspecified: Secondary | ICD-10-CM | POA: Diagnosis not present

## 2021-05-29 MED ORDER — SERTRALINE HCL 100 MG PO TABS: 100.0000 mg | ORAL_TABLET | Freq: Every day | ORAL | 1 refills | Status: DC

## 2021-05-29 MED ORDER — LOSARTAN POTASSIUM 100 MG PO TABS: 100.0000 mg | ORAL_TABLET | Freq: Every day | ORAL | 1 refills | Status: DC

## 2021-05-29 MED ORDER — TRAMADOL HCL 50 MG PO TABS: 25.0000 mg | ORAL_TABLET | Freq: Every day | ORAL | 0 refills | Status: DC | PRN

## 2021-05-29 MED ORDER — METOPROLOL SUCCINATE ER 25 MG PO TB24: 25.0000 mg | ORAL_TABLET | Freq: Every day | ORAL | 1 refills | Status: DC

## 2021-05-29 MED ORDER — OMEPRAZOLE 20 MG PO CPDR: 20.0000 mg | DELAYED_RELEASE_CAPSULE | Freq: Every day | ORAL | 3 refills | Status: DC

## 2021-05-29 NOTE — Assessment & Plan Note (Signed)
Checking labs today. Await results. Treat as needed.  

## 2021-05-29 NOTE — Progress Notes (Signed)
? ?BP 137/83   Pulse 68   Temp 97.9 ?F (36.6 ?C)   Wt 114 lb (51.7 kg)   SpO2 97%   BMI 22.64 kg/m?   ? ?Subjective:  ? ? Patient ID: Casey Summers, female    DOB: 07-Mar-1945, 76 y.o.   MRN: [id number] ? ?HPI: ?Casey Summers is a 76 y.o. female ? ?Chief Complaint  ?Patient presents with  ? Hypertension  ? Cough  ?  Patient states she has been coughing off and on for about 9 months  ? ?COUGH ?Duration: 9 months ?Circumstances of initial development of cough: unknown ?Cough severity: moderate ?Cough description: dry and irritating ?Aggravating factors:  worse at night and worse in the AM ?Alleviating factors: nothing ?Status:  fluctuating ?Treatments attempted: cough syrup ?Wheezing: no ?Shortness of breath: no ?Chest pain: no ?Chest tightness:no ?Nasal congestion: no ?Runny nose: no ?Postnasal drip: yes ?Frequent throat clearing or swallowing: no ?Hemoptysis: no ?Fevers: no ?Night sweats: no ?Weight loss: no ?Heartburn: no ?Recent foreign travel: no ?Tuberculosis contacts: no ? ?HYPERTENSION ?Hypertension status: controlled  ?Satisfied with current treatment? yes ?Duration of hypertension: chronic ?BP monitoring frequency:  not checking ?BP medication side effects:  no ?Medication compliance: excellent compliance ?Previous BP meds:benazepril, metoprolol ?Aspirin: yes ?Recurrent headaches: no ?Visual changes: no ?Palpitations: no ?Dyspnea: no ?Chest pain: no ?Lower extremity edema: no ?Dizzy/lightheaded: no ? ?DEPRESSION- lost her partner suddenly in March. Has not been doing great ?Mood status: exacerbated ?Satisfied with current treatment?: no ?Symptom severity: moderate  ?Duration of current treatment : chronic ?Side effects: no ?Medication compliance: excellent compliance ?Psychotherapy/counseling: no  ?Previous psychiatric medications: sertraline ?Depressed mood: yes ?Anxious mood: yes ?Anhedonia: no ?Significant weight loss or gain: no ?Insomnia: no  ?Fatigue: yes ?Feelings of worthlessness or  guilt: no ?Impaired concentration/indecisiveness: no ?Suicidal ideations: no ?Hopelessness: no ?Crying spells: yes ? ?  05/29/2021  ? 11:20 AM 10/20/2020  ?  3:11 PM 06/29/2020  ?  2:14 PM 12/14/2019  ? 11:18 AM 08/23/2019  ? 11:22 AM  ?Depression screen PHQ 2/9  ?Decreased Interest 0 0 0 1 0  ?Down, Depressed, Hopeless 1 0 1 1 0  ?PHQ - 2 Score 1 0 1 2 0  ?Altered sleeping 1  1 0 0  ?Tired, decreased energy 1  0 0 0  ?Change in appetite 0  0 0 0  ?Feeling bad or failure about yourself  0  0 0 0  ?Trouble concentrating 0  0 0 0  ?Moving slowly or fidgety/restless 0  0 0 0  ?Suicidal thoughts 0  0 0 0  ?PHQ-9 Score '3  2 2 '$ 0  ?Difficult doing work/chores   Not difficult at all  Not difficult at all  ? ? ? ?Relevant past medical, surgical, family and social history reviewed and updated as indicated. Interim medical history since our last visit reviewed. ?Allergies and medications reviewed and updated. ? ?Review of Systems  ?Constitutional: Negative.   ?HENT: Negative.    ?Respiratory:  Positive for cough. Negative for apnea, choking, chest tightness, shortness of breath, wheezing and stridor.   ?Cardiovascular: Negative.   ?Gastrointestinal: Negative.   ?Musculoskeletal: Negative.   ?Neurological: Negative.   ?Psychiatric/Behavioral:  Positive for dysphoric mood. Negative for agitation, behavioral problems, confusion, decreased concentration, hallucinations, self-injury, sleep disturbance and suicidal ideas. The patient is not nervous/anxious and is not hyperactive.   ? ?Per HPI unless specifically indicated above ? ?   ?Objective:  ?  ?BP 137/83   Pulse  68   Temp 97.9 ?F (36.6 ?C)   Wt 114 lb (51.7 kg)   SpO2 97%   BMI 22.64 kg/m?   ?Wt Readings from Last 3 Encounters:  ?05/29/21 114 lb (51.7 kg)  ?10/20/20 114 lb 12.8 oz (52.1 kg)  ?06/29/20 115 lb 9.6 oz (52.4 kg)  ?  ?Physical Exam ?Vitals and nursing note reviewed.  ?Constitutional:   ?   General: She is not in acute distress. ?   Appearance: Normal appearance. She  is normal weight. She is not ill-appearing, toxic-appearing or diaphoretic.  ?HENT:  ?   Head: Normocephalic and atraumatic.  ?   Right Ear: External ear normal.  ?   Left Ear: External ear normal.  ?   Nose: Nose normal.  ?   Mouth/Throat:  ?   Mouth: Mucous membranes are moist.  ?   Pharynx: Oropharynx is clear.  ?Eyes:  ?   General: No scleral icterus.    ?   Right eye: No discharge.     ?   Left eye: No discharge.  ?   Extraocular Movements: Extraocular movements intact.  ?   Conjunctiva/sclera: Conjunctivae normal.  ?   Pupils: Pupils are equal, round, and reactive to light.  ?Cardiovascular:  ?   Rate and Rhythm: Normal rate and regular rhythm.  ?   Pulses: Normal pulses.  ?   Heart sounds: Normal heart sounds. No murmur heard. ?  No friction rub. No gallop.  ?Pulmonary:  ?   Effort: Pulmonary effort is normal. No respiratory distress.  ?   Breath sounds: Normal breath sounds. No stridor. No wheezing, rhonchi or rales.  ?Chest:  ?   Chest wall: No tenderness.  ?Musculoskeletal:     ?   General: Normal range of motion.  ?   Cervical back: Normal range of motion and neck supple.  ?Skin: ?   General: Skin is warm and dry.  ?   Capillary Refill: Capillary refill takes less than 2 seconds.  ?   Coloration: Skin is not jaundiced or pale.  ?   Findings: No bruising, erythema, lesion or rash.  ?Neurological:  ?   General: No focal deficit present.  ?   Mental Status: She is alert and oriented to person, place, and time. Mental status is at baseline.  ?Psychiatric:     ?   Mood and Affect: Mood normal.     ?   Behavior: Behavior normal.     ?   Thought Content: Thought content normal.     ?   Judgment: Judgment normal.  ? ? ?Results for orders placed or performed in visit on 10/20/20  ?HM MAMMOGRAPHY  ?Result Value Ref Range  ? HM Mammogram 0-4 Bi-Rad 0-4 Bi-Rad, Self Reported Normal  ?Lyme Disease Serology w/Reflex  ?Result Value Ref Range  ? Lyme Total Antibody EIA Negative Negative  ?Rocky mtn spotted fvr abs  pnl(IgG+IgM)  ?Result Value Ref Range  ? RMSF IgG Negative Negative  ? RMSF IgM 0.42 0.00 - 0.89 index  ?Babesia microti Antibody Panel  ?Result Value Ref Range  ? Babesia microti IgM <1:10 Neg:<1:10  ? Babesia microti IgG <1:10 Neg:<1:10  ?Ehrlichia Antibody Panel  ?Result Value Ref Range  ? E.Chaffeensis (HME) IgG Negative Neg:<1:64  ? E. Chaffeensis (HME) IgM Titer Negative Neg:<1:20  ? HGE IgG Titer Negative Neg:<1:64  ? HGE IgM Titer Negative Neg:<1:20  ? ?   ?Assessment & Plan:  ? ?Problem List Items Addressed This  Visit   ? ?  ? Cardiovascular and Mediastinum  ? Hypertension - Primary  ?  Under good control on current regimen. Concern for cough coming from benazepril. Will change to losartan and recheck when she gets back from [place]. Call with any concerns.  ? ?  ?  ? Relevant Medications  ? losartan (COZAAR) 100 MG tablet  ? metoprolol succinate (TOPROL-XL) 25 MG 24 hr tablet  ? Other Relevant Orders  ? Comprehensive metabolic panel  ? Aortic atherosclerosis ([place])  ?  Checking labs today. Await results. Treat as needed.  ? ?  ?  ? Relevant Medications  ? losartan (COZAAR) 100 MG tablet  ? metoprolol succinate (TOPROL-XL) 25 MG 24 hr tablet  ? Other Relevant Orders  ? Comprehensive metabolic panel  ? Lipid Panel w/o Chol/HDL Ratio  ?  ? Other  ? Low back pain  ?  Under good control on current regimen. Continue current regimen. Continue to monitor. Call with any concerns. Refills given.  ? ? ?  ?  ? Relevant Medications  ? traMADol (ULTRAM) 50 MG tablet  ? Depression  ?  In exacerbation with the loss of her partner. Will increase her sertraline to '100mg'$  and recheck when she gets back from [place]. Call with any concerns.  ? ?  ?  ? Relevant Medications  ? sertraline (ZOLOFT) 100 MG tablet  ? ?Other Visit Diagnoses   ? ? Chronic cough      ? Will check CXR and change her benazepril to losartan. Lungs clear today. Await results. Treat as needed.   ? Relevant Orders  ? DG Chest 2 View  ? ?  ?  ? ?Follow up  plan: ?Return in about 3 months (around 08/29/2021). ? ? ? ? ? ?

## 2021-05-29 NOTE — Assessment & Plan Note (Addendum)
Under good control on current regimen. Concern for cough coming from benazepril. Will change to losartan and recheck when she gets back from [place]. Call with any concerns.  ?

## 2021-05-29 NOTE — Assessment & Plan Note (Signed)
Under good control on current regimen. Continue current regimen. Continue to monitor. Call with any concerns. Refills given.   

## 2021-05-29 NOTE — Assessment & Plan Note (Signed)
In exacerbation with the loss of her partner. Will increase her sertraline to '100mg'$  and recheck when she gets back from [place]. Call with any concerns.  ?

## 2021-05-30 ENCOUNTER — Telehealth: Payer: Self-pay | Admitting: *Deleted

## 2021-05-30 ENCOUNTER — Encounter: Payer: Self-pay | Admitting: Family Medicine

## 2021-05-30 LAB — COMPREHENSIVE METABOLIC PANEL
ALT: 52 IU/L — ABNORMAL HIGH (ref 0–32)
AST: 62 IU/L — ABNORMAL HIGH (ref 0–40)
Albumin/Globulin Ratio: 2.3 — ABNORMAL HIGH (ref 1.2–2.2)
Albumin: 4.8 g/dL — ABNORMAL HIGH (ref 3.7–4.7)
Alkaline Phosphatase: 82 IU/L (ref 44–121)
BUN/Creatinine Ratio: 14 (ref 12–28)
BUN: 10 mg/dL (ref 8–27)
Bilirubin Total: 0.7 mg/dL (ref 0.0–1.2)
CO2: 24 mmol/L (ref 20–29)
Calcium: 9.4 mg/dL (ref 8.7–10.3)
Chloride: 100 mmol/L (ref 96–106)
Creatinine, Ser: 0.73 mg/dL (ref 0.57–1.00)
Globulin, Total: 2.1 g/dL (ref 1.5–4.5)
Glucose: 85 mg/dL (ref 70–99)
Potassium: 4.4 mmol/L (ref 3.5–5.2)
Sodium: 140 mmol/L (ref 134–144)
Total Protein: 6.9 g/dL (ref 6.0–8.5)
eGFR: 87 mL/min/{1.73_m2} (ref >59)

## 2021-05-30 LAB — LIPID PANEL W/O CHOL/HDL RATIO
Cholesterol, Total: 234 mg/dL — ABNORMAL HIGH (ref 100–199)
HDL: 108 mg/dL (ref >39)
LDL Chol Calc (NIH): 112 mg/dL — ABNORMAL HIGH (ref 0–99)
Triglycerides: 85 mg/dL (ref 0–149)
VLDL Cholesterol Cal: 14 mg/dL (ref 5–40)

## 2021-05-30 NOTE — Telephone Encounter (Signed)
Pt called for Lab/CXR results. Reviewed CXR message from Dr. Wynetta Emery, labs not released.  Please advise. ?

## 2021-05-31 ENCOUNTER — Ambulatory Visit: Payer: Self-pay | Admitting: *Deleted

## 2021-05-31 ENCOUNTER — Other Ambulatory Visit: Payer: Self-pay | Admitting: Family Medicine

## 2021-05-31 ENCOUNTER — Telehealth: Payer: Self-pay

## 2021-05-31 MED ORDER — LOSARTAN POTASSIUM 100 MG PO TABS: 50.0000 mg | ORAL_TABLET | Freq: Every day | ORAL | 1 refills | Status: DC

## 2021-05-31 NOTE — Telephone Encounter (Signed)
? ? ?  Chief Complaint: fell yesterday after taking losartan 100 mg and got dizziness and slide down wall to floor ?Symptoms: none now, was dizzy after taking new medication  ?Frequency: yesterday  ?Pertinent Negatives: Patient denies injury or hitting head  ?Disposition: '[]'$ ED /'[]'$ Urgent Care (no appt availability in office) / '[]'$ Appointment(In office/virtual)/ '[]'$  [facility]/ '[x]'$ Home Care/ '[]'$ Refused Recommended Disposition /'[]'$ [facility]/ '[]'$  Follow-up with PCP ?Additional Notes:  ? ?Reviewed message from Dr. Wynetta Emery  from 05/31/21 to have her take 1/2 of it and we'll see how she does. Patient verbalized understanding . Recommended to stand up slow and allow time to balance before walking. Monitor BP and keep journal before medication and a few hours after or if sx occur after taking medication. ? ? Reason for Disposition ? [1] Recent fall AND [2] no injury ? ?Answer Assessment - Initial Assessment Questions ?1. MECHANISM: "How did the fall happen?" ?    Fell after taking losartan 100 mg  got dizzy and grabbed wall and slide to floor ?2. DOMESTIC VIOLENCE AND ELDER ABUSE SCREENING: "Did you fall because someone pushed you or tried to hurt you?" If Yes, ask: "Are you safe now?" ?    na ?3. ONSET: "When did the fall happen?" (e.g., minutes, hours, or days ago) ?    Yesterday  ?4. LOCATION: "What part of the body hit the ground?" (e.g., back, buttocks, head, hips, knees, hands, head, stomach) ?    Forearms with mild abrasion ?5. INJURY: "Did you hurt (injure) yourself when you fell?" If Yes, ask: "What did you injure? Tell me more about this?" (e.g., body area; type of injury; pain severity)" ?    Denies  ?6. PAIN: "Is there any pain?" If Yes, ask: "How bad is the pain?" (e.g., Scale 1-10; or mild,  ?moderate, severe) ?  - NONE (0): No pain ?  - MILD (1-3): Doesn't interfere with normal activities  ?  - MODERATE (4-7): Interferes with normal activities or awakens from sleep  ?  - SEVERE (8-10):  Excruciating pain, unable to do any normal activities  ?    na ?7. SIZE: For cuts, bruises, or swelling, ask: "How large is it?" (e.g., inches or centimeters)  ?    na ?8. PREGNANCY: "Is there any chance you are pregnant?" "When was your last menstrual period?" ?    Na ?9. OTHER SYMPTOMS: "Do you have any other symptoms?" (e.g., dizziness, fever, weakness; new onset or worsening).  ?    None now , dizziness 1 hour to 1 1/2 hours after taking losartan  ?10. CAUSE: "What do you think caused the fall (or falling)?" (e.g., tripped, dizzy spell) ?      Medication caused dizziness ? ?Protocols used: Falls and Falling-A-AH ? ?

## 2021-05-31 NOTE — Telephone Encounter (Signed)
Called patient to give results. While on the phone, patient states that she took her first dose of losartan yesterday. About an hour to an hour and a half later, she got real dizzy and fell. States she did not hurt herself but wanted to make Dr. Wynetta Emery aware. Patient states she has not taken the medication yet today.  ?

## 2021-05-31 NOTE — Telephone Encounter (Signed)
Have her take 1/2 of it and we'll see how she does.  ?

## 2021-05-31 NOTE — Telephone Encounter (Signed)
See other phone encounter.  

## 2021-06-08 ENCOUNTER — Other Ambulatory Visit: Payer: Self-pay | Admitting: Family Medicine

## 2021-06-11 NOTE — Telephone Encounter (Signed)
Requested medication (s) are due for refill today: yes ? ?Requested medication (s) are on the active medication list: yes ? ?Last refill:  01/14/21 #12/1 ? ?Future visit scheduled: yes ? ?Notes to clinic:  Unable to refill per protocol due to failed labs, no updated results. ? ?  ?Requested Prescriptions  ?Pending Prescriptions Disp Refills  ? alendronate (FOSAMAX) 70 MG tablet [Pharmacy Med Name: ALENDRONATE SODIUM 70 MG TAB] 12 tablet 1  ?  Sig: TAKE 1 TABLET EVERY 7 DAYS. TAKE WITH A FULL GLASS OF WATER ON AN EMPTY STOMACH.  ?  ? Endocrinology:  Bisphosphonates Failed - 06/08/2021  1:07 PM  ?  ?  Failed - Mg Level in normal range and within 360 days  ?  No results found for: MG   ?  ?  Failed - Phosphate in normal range and within 360 days  ?  No results found for: PHOS   ?  ?  Failed - Bone Mineral Density or Dexa Scan completed in the last 2 years  ?  ?  Passed - Ca in normal range and within 360 days  ?  Calcium  ?Date Value Ref Range Status  ?05/29/2021 9.4 8.7 - 10.3 mg/dL Final  ?   ?  ?  Passed - Vitamin D in normal range and within 360 days  ?  Vit D, 25-Hydroxy  ?Date Value Ref Range Status  ?06/29/2020 76.6 30.0 - 100.0 ng/mL Final  ?  Comment:  ?  Vitamin D deficiency has been defined by the Institute of ?Medicine and an Endocrine Society practice guideline as a ?level of serum 25-OH vitamin D less than 20 ng/mL (1,2). ?The Endocrine Society went on to further define vitamin D ?insufficiency as a level between 21 and 29 ng/mL (2). ?1. IOM (Institute of Medicine). 2010. Dietary reference ?   intakes for calcium and D. [place]: The ?   [employer]. ?2. Holick MF, Binkley [place], Bischoff-Ferrari HA, et al. ?   Evaluation, treatment, and prevention of vitamin D ?   deficiency: an Endocrine Society clinical practice ?   guideline. JCEM. 2011 Jul; 96(7):1911-30. ?  ?   ?  ?  Passed - Cr in normal range and within 360 days  ?  Creatinine  ?Date Value Ref Range Status  ?02/22/2019 116.9 20.0 -  300.0 mg/dL Final  ? ?Creatinine, Ser  ?Date Value Ref Range Status  ?05/29/2021 0.73 0.57 - 1.00 mg/dL Final  ?   ?  ?  Passed - eGFR is 30 or above and within 360 days  ?  GFR calc Af Amer  ?Date Value Ref Range Status  ?10/12/2019 >60 >60 mL/min Final  ? ?GFR calc non Af Amer  ?Date Value Ref Range Status  ?10/12/2019 >60 >60 mL/min Final  ? ?eGFR  ?Date Value Ref Range Status  ?05/29/2021 87 >59 mL/min/1.73 Final  ?   ?  ?  Passed - Valid encounter within last 12 months  ?  Recent Outpatient Visits   ? ?      ? 1 week ago Primary hypertension  ? [place], DO  ? 7 months ago Encounter for annual wellness exam in Medicare patient  ? [place], DO  ? 11 months ago Primary hypertension  ? [place], [place] P, DO  ? 1 year ago Chronic left-sided low back pain with left-sided sciatica  ? [facility] [place], [place]  P, DO  ? 1 year ago Chest pain, unspecified type  ? [place], [place] P, DO  ? ?  ?  ?Future Appointments   ? ?        ? In 2 months Wynetta Emery, Barb Merino, DO [facility]  ? ?  ? ? ?  ?  ?  ? ?

## 2021-07-09 ENCOUNTER — Other Ambulatory Visit: Payer: Self-pay | Admitting: Family Medicine

## 2021-07-09 DIAGNOSIS — G8929 Other chronic pain: Secondary | ICD-10-CM

## 2021-07-10 NOTE — Telephone Encounter (Signed)
Requested medication (s) are due for refill today: Yes  Requested medication (s) are on the active medication list: Yes  Last refill:  05/29/21 Future visit scheduled: Yes  Notes to clinic:  See request.    Requested Prescriptions  Pending Prescriptions Disp Refills   traMADol (ULTRAM) 50 MG tablet [Pharmacy Med Name: TRAMADOL HCL 50 MG TABLET] 30 tablet 0    Sig: Take 0.5-1 tablets (25-50 mg total) by mouth daily as needed.     Not Delegated - Analgesics:  Opioid Agonists Failed - 07/09/2021  4:07 PM      Failed - This refill cannot be delegated      Failed - Urine Drug Screen completed in last 360 days      Passed - Valid encounter within last 3 months    Recent Outpatient Visits           1 month ago Primary hypertension   [place], Megan P, DO   8 months ago Encounter for annual wellness exam in Medicare patient   [place], [place], DO   1 year ago Primary hypertension   [place], DO   1 year ago Chronic left-sided low back pain with left-sided sciatica   [place], DO   1 year ago Chest pain, unspecified type   [facility] Valerie Ro[place], DO       Future Appointments             In 1 month [place], Barb Merino, DO [facility], [facility]

## 2021-07-12 NOTE — Addendum Note (Signed)
Addended by: Georgina Peer on: 07/12/2021 01:43 PM   Modules accepted: Orders

## 2021-07-12 NOTE — Telephone Encounter (Signed)
Patient called in about why tramadol was refused , says refill not appropriate and notes in refill request, saying not more that 5 refills before 11/25/21. Is this the reason this was refused? She states she hasn't had 5 refills. Please call back

## 2021-07-17 ENCOUNTER — Ambulatory Visit: Payer: Self-pay | Admitting: *Deleted

## 2021-07-17 NOTE — Telephone Encounter (Addendum)
Summary: Rx refill refused bu ptt had appt 05/29/21   Pt called for an update on her request for a refill of traMADol (ULTRAM) 50 MG tablet. Pt stated her pharmacy informed her that they have not received a refill request. It shows that the refill request was refused. Pt had an appt on 05/29/21 and requests a call back. Cb# [phone]     Message left. Will try again in 15 min.

## 2021-07-17 NOTE — Telephone Encounter (Signed)
Spoke with pt and explained that if she is taking a pill a day that is an increase from previous and that is why it will not refilled. Pt states her spouse died and she took one a day for a week and then decreased to 1/2 a day and she is down to 1/2 of a tablet left that she filled for #30 on 05/29/21. Pt crying, I will speak to her again tomorrow to see if she will schedule appt.

## 2021-07-18 NOTE — Telephone Encounter (Signed)
Were you able to speak to this patient today?

## 2021-08-27 ENCOUNTER — Ambulatory Visit: Payer: Medicare Other

## 2021-08-29 ENCOUNTER — Ambulatory Visit (INDEPENDENT_AMBULATORY_CARE_PROVIDER_SITE_OTHER): Payer: Medicare Other | Admitting: Family Medicine

## 2021-08-29 ENCOUNTER — Encounter: Payer: Self-pay | Admitting: Family Medicine

## 2021-08-29 VITALS — BP 115/70 | HR 71 | Temp 98.2°F | Wt 118.0 lb

## 2021-08-29 DIAGNOSIS — F3342 Major depressive disorder, recurrent, in full remission: Secondary | ICD-10-CM | POA: Diagnosis not present

## 2021-08-29 DIAGNOSIS — Z1231 Encounter for screening mammogram for malignant neoplasm of breast: Secondary | ICD-10-CM

## 2021-08-29 DIAGNOSIS — I10 Essential (primary) hypertension: Secondary | ICD-10-CM

## 2021-08-29 MED ORDER — LOSARTAN POTASSIUM 50 MG PO TABS: 50.0000 mg | ORAL_TABLET | Freq: Every day | ORAL | 1 refills | Status: DC

## 2021-08-29 NOTE — Progress Notes (Signed)
BP 115/70   Pulse 71   Temp 98.2 F (36.8 C)   Wt 118 lb (53.5 kg)   SpO2 97%   BMI 23.43 kg/m    Subjective:    Patient ID: Casey Summers, female    DOB: [birth year], 76 y.o.   MRN: [id number]  HPI: Casey Summers is a 76 y.o. female  Chief Complaint  Patient presents with   Hypertension   Depression   HYPERTENSION Hypertension status: controlled  Satisfied with current treatment? yes Duration of hypertension: chronic BP monitoring frequency:  not checking BP medication side effects:  no Medication compliance: excellent compliance Previous BP meds:losartan, metoprolol Aspirin: yes Recurrent headaches: no Visual changes: no Palpitations: no Dyspnea: no Chest pain: no Lower extremity edema: no Dizzy/lightheaded: no  DEPRESSION Mood status: better Satisfied with current treatment?: yes Symptom severity: mild  Duration of current treatment : chronic Side effects: no Medication compliance: excellent compliance Psychotherapy/counseling: no  Previous psychiatric medications: sertraline Depressed mood: no Anxious mood: no Anhedonia: no Significant weight loss or gain: no Insomnia: no  Fatigue: yes Feelings of worthlessness or guilt: no Impaired concentration/indecisiveness: no Suicidal ideations: no Hopelessness: no Crying spells: no    08/29/2021   11:14 AM 05/29/2021   11:20 AM 10/20/2020    3:11 PM 06/29/2020    2:14 PM 12/14/2019   11:18 AM  Depression screen PHQ 2/9  Decreased Interest 0 0 0 0 1  Down, Depressed, Hopeless 1 1 0 1 1  PHQ - 2 Score 1 1 0 1 2  Altered sleeping '1 1  1 ' 0  Tired, decreased energy 1 1  0 0  Change in appetite 1 0  0 0  Feeling bad or failure about yourself  0 0  0 0  Trouble concentrating 0 0  0 0  Moving slowly or fidgety/restless 0 0  0 0  Suicidal thoughts 0 0  0 0  PHQ-9 Score '4 3  2 2  ' Difficult doing work/chores    Not difficult at all      Relevant past medical, surgical, family and social history  reviewed and updated as indicated. Interim medical history since our last visit reviewed. Allergies and medications reviewed and updated.  Review of Systems  Constitutional: Negative.   Respiratory: Negative.    Cardiovascular: Negative.   Musculoskeletal:  Positive for back pain and myalgias. Negative for arthralgias, gait problem, joint swelling, neck pain and neck stiffness.  Skin: Negative.   Psychiatric/Behavioral: Negative.      Per HPI unless specifically indicated above     Objective:    BP 115/70   Pulse 71   Temp 98.2 F (36.8 C)   Wt 118 lb (53.5 kg)   SpO2 97%   BMI 23.43 kg/m   Wt Readings from Last 3 Encounters:  08/29/21 118 lb (53.5 kg)  05/29/21 114 lb (51.7 kg)  10/20/20 114 lb 12.8 oz (52.1 kg)    Physical Exam Vitals and nursing note reviewed.  Constitutional:      General: She is not in acute distress.    Appearance: Normal appearance. She is not ill-appearing, toxic-appearing or diaphoretic.  HENT:     Head: Normocephalic and atraumatic.     Right Ear: External ear normal.     Left Ear: External ear normal.     Nose: Nose normal.     Mouth/Throat:     Mouth: Mucous membranes are moist.     Pharynx: Oropharynx is clear.  Eyes:  General: No scleral icterus.       Right eye: No discharge.        Left eye: No discharge.     Extraocular Movements: Extraocular movements intact.     Conjunctiva/sclera: Conjunctivae normal.     Pupils: Pupils are equal, round, and reactive to light.  Cardiovascular:     Rate and Rhythm: Normal rate and regular rhythm.     Pulses: Normal pulses.     Heart sounds: Normal heart sounds. No murmur heard.    No friction rub. No gallop.  Pulmonary:     Effort: Pulmonary effort is normal. No respiratory distress.     Breath sounds: Normal breath sounds. No stridor. No wheezing, rhonchi or rales.  Chest:     Chest wall: No tenderness.  Musculoskeletal:        General: Normal range of motion.     Cervical back:  Normal range of motion and neck supple.  Skin:    General: Skin is warm and dry.     Capillary Refill: Capillary refill takes less than 2 seconds.     Coloration: Skin is not jaundiced or pale.     Findings: No bruising, erythema, lesion or rash.  Neurological:     General: No focal deficit present.     Mental Status: She is alert and oriented to person, place, and time. Mental status is at baseline.  Psychiatric:        Mood and Affect: Mood normal.        Behavior: Behavior normal.        Thought Content: Thought content normal.        Judgment: Judgment normal.     Results for orders placed or performed in visit on 05/29/21  Comprehensive metabolic panel  Result Value Ref Range   Glucose 85 70 - 99 mg/dL   BUN 10 8 - 27 mg/dL   Creatinine, Ser 0.73 0.57 - 1.00 mg/dL   eGFR 87 >59 mL/min/1.73   BUN/Creatinine Ratio 14 12 - 28   Sodium 140 134 - 144 mmol/L   Potassium 4.4 3.5 - 5.2 mmol/L   Chloride 100 96 - 106 mmol/L   CO2 24 20 - 29 mmol/L   Calcium 9.4 8.7 - 10.3 mg/dL   Total Protein 6.9 6.0 - 8.5 g/dL   Albumin 4.8 (H) 3.7 - 4.7 g/dL   Globulin, Total 2.1 1.5 - 4.5 g/dL   Albumin/Globulin Ratio 2.3 (H) 1.2 - 2.2   Bilirubin Total 0.7 0.0 - 1.2 mg/dL   Alkaline Phosphatase 82 44 - 121 IU/L   AST 62 (H) 0 - 40 IU/L   ALT 52 (H) 0 - 32 IU/L  Lipid Panel w/o Chol/HDL Ratio  Result Value Ref Range   Cholesterol, Total 234 (H) 100 - 199 mg/dL   Triglycerides 85 0 - 149 mg/dL   HDL 108 >39 mg/dL   VLDL Cholesterol Cal 14 5 - 40 mg/dL   LDL Chol Calc (NIH) 112 (H) 0 - 99 mg/dL      Assessment & Plan:   Problem List Items Addressed This Visit       Cardiovascular and Mediastinum   Hypertension - Primary    Under good control on current regimen. Continue current regimen. Continue to monitor. Call with any concerns. Refills given. Labs drawn today.       Relevant Medications   losartan (COZAAR) 50 MG tablet   Other Relevant Orders   Basic metabolic panel  Other   Depression    Under good control on current regimen. Continue current regimen. Continue to monitor. Call with any concerns. Refills given.        Other Visit Diagnoses     Encounter for screening mammogram for malignant neoplasm of breast       Mammogram ordered today.   Relevant Orders   MM 3D SCREEN BREAST BILATERAL        Follow up plan: Return in about 3 months (around 11/29/2021).

## 2021-08-29 NOTE — Assessment & Plan Note (Signed)
Under good control on current regimen. Continue current regimen. Continue to monitor. Call with any concerns. Refills given. Labs drawn today.   

## 2021-08-29 NOTE — Assessment & Plan Note (Signed)
Under good control on current regimen. Continue current regimen. Continue to monitor. Call with any concerns. Refills given.   

## 2021-08-30 LAB — BASIC METABOLIC PANEL
BUN/Creatinine Ratio: 14 (ref 12–28)
BUN: 9 mg/dL (ref 8–27)
CO2: 21 mmol/L (ref 20–29)
Calcium: 9.1 mg/dL (ref 8.7–10.3)
Chloride: 104 mmol/L (ref 96–106)
Creatinine, Ser: 0.64 mg/dL (ref 0.57–1.00)
Glucose: 86 mg/dL (ref 70–99)
Potassium: 4.5 mmol/L (ref 3.5–5.2)
Sodium: 142 mmol/L (ref 134–144)
eGFR: 93 mL/min/{1.73_m2} (ref >59)

## 2021-08-30 NOTE — Progress Notes (Signed)
Please let patient know that her lab work looks good.  No concerns at this time. Continue with current medication regimen.  Follow up as discussed.

## 2021-08-31 ENCOUNTER — Telehealth: Payer: Self-pay

## 2021-08-31 ENCOUNTER — Other Ambulatory Visit: Payer: Self-pay

## 2021-08-31 DIAGNOSIS — M810 Age-related osteoporosis without current pathological fracture: Secondary | ICD-10-CM

## 2021-08-31 NOTE — Telephone Encounter (Signed)
Order placed and printed for Dr. Wynetta Emery to sign. Will faxed to number provided by the patient once signed.

## 2021-08-31 NOTE — Telephone Encounter (Signed)
Copied from [place] [id number]. Topic: General - Other >> Aug 31, 2021 11:41 AM VZDGLOVF J wrote: Reason for CRM: pt called in for assistance, pt says that she was suppose to have a bone density order sent over to  [facility]. Pt says that they have received the mammogram order but not the bone density. Pt would like further assistance with having order faxed to number below.   [phone]

## 2021-09-03 NOTE — Telephone Encounter (Signed)
John calling from [facility] is calling to get an updated status. CB- [phone] option 1

## 2021-09-03 NOTE — Telephone Encounter (Signed)
Order re-faxed with time of signature.

## 2021-09-03 NOTE — Telephone Encounter (Signed)
Order signed by Dr. Wynetta Emery and faxed to [facility] as requested.

## 2021-09-03 NOTE — Telephone Encounter (Signed)
Casey Summers with [facility] radiology called saying they got the fax back but it does not have the time that dr, Wynetta Emery signed it.  It has the date but no time.  They need the time also.

## 2021-09-05 NOTE — Telephone Encounter (Signed)
Casey Summers w/ [facility] making provider aware pt's referral for bone density scan has been canceled due to not being able to reach pt to get appt schedule

## 2021-09-05 NOTE — Telephone Encounter (Signed)
Attempted to contact patient NA unable to LVM to give number to radiology for patient to schedule DEXA scan.

## 2021-09-17 DIAGNOSIS — M810 Age-related osteoporosis without current pathological fracture: Secondary | ICD-10-CM | POA: Diagnosis not present

## 2021-09-17 LAB — HM DEXA SCAN

## 2021-09-24 ENCOUNTER — Telehealth: Payer: Self-pay | Admitting: Family Medicine

## 2021-09-24 NOTE — Telephone Encounter (Signed)
No results in computer. Can we find out where she had it done?

## 2021-09-24 NOTE — Telephone Encounter (Signed)
Patient called in for the results of her Bone density test, says had done last Monday. Please call back when results available

## 2021-09-24 NOTE — Telephone Encounter (Signed)
Done at [facility] imaging, will print and abstract/scan into chart.

## 2021-10-02 DIAGNOSIS — Z853 Personal history of malignant neoplasm of breast: Secondary | ICD-10-CM | POA: Diagnosis not present

## 2021-10-02 DIAGNOSIS — G9389 Other specified disorders of brain: Secondary | ICD-10-CM | POA: Diagnosis not present

## 2021-10-02 DIAGNOSIS — Z803 Family history of malignant neoplasm of breast: Secondary | ICD-10-CM | POA: Diagnosis not present

## 2021-10-02 DIAGNOSIS — R519 Headache, unspecified: Secondary | ICD-10-CM | POA: Diagnosis not present

## 2021-10-02 DIAGNOSIS — Z1231 Encounter for screening mammogram for malignant neoplasm of breast: Secondary | ICD-10-CM | POA: Diagnosis not present

## 2021-10-02 DIAGNOSIS — Z923 Personal history of irradiation: Secondary | ICD-10-CM | POA: Diagnosis not present

## 2021-10-02 DIAGNOSIS — I10 Essential (primary) hypertension: Secondary | ICD-10-CM | POA: Diagnosis not present

## 2021-10-02 DIAGNOSIS — Z7982 Long term (current) use of aspirin: Secondary | ICD-10-CM | POA: Diagnosis not present

## 2021-10-02 DIAGNOSIS — Z90722 Acquired absence of ovaries, bilateral: Secondary | ICD-10-CM | POA: Diagnosis not present

## 2021-10-02 DIAGNOSIS — Z79810 Long term (current) use of selective estrogen receptor modulators (SERMs): Secondary | ICD-10-CM | POA: Diagnosis not present

## 2021-10-02 DIAGNOSIS — Z90710 Acquired absence of both cervix and uterus: Secondary | ICD-10-CM | POA: Diagnosis not present

## 2021-10-02 DIAGNOSIS — Z79899 Other long term (current) drug therapy: Secondary | ICD-10-CM | POA: Diagnosis not present

## 2021-10-02 LAB — HM MAMMOGRAPHY

## 2021-10-03 ENCOUNTER — Encounter: Payer: Self-pay | Admitting: Family Medicine

## 2021-10-03 ENCOUNTER — Telehealth: Payer: Self-pay | Admitting: *Deleted

## 2021-10-03 NOTE — Telephone Encounter (Signed)
Transition Care Management Follow-up Telephone Call Date of discharge and from where:[facility]   10-02-2021 How have you been since you were released from the hospital?  Feeling ok still has headache Any questions or concerns? No  Items Reviewed: Did the pt receive and understand the discharge instructions provided? Yes  Medications obtained and verified? Yes  Other? No  Any new allergies since your discharge? No  Dietary orders reviewed? No Do you have support at home? Yes   Home Care and Equipment/Supplies: Were home health services ordered?  If so, what is the name of the agency?   Has the agency set up a time to come to the patient's home?  Were any new equipment or medical supplies ordered?   What is the name of the medical supply agency?  Were you able to get the supplies/equipment?  Do you have any questions related to the use of the equipment or supplies?   Functional Questionnaire: (I = Independent and D = Dependent) ADLs: I  Bathing/Dressing- I  Meal Prep- I  Eating- I  Maintaining continence- I  Transferring/Ambulation- I  Managing Meds- I  Follow up appointments reviewed:  PCP Hospital f/u appt confirmed?  Patient will call to schedule she gets back from out of town.   [facility] f/u appt confirmed? No  . Are transportation arrangements needed? No  If their condition worsens, is the pt aware to call PCP or go to the Emergency Dept.? Yes Was the patient provided with contact information for the PCP's office or ED? Yes Was to pt encouraged to call back with questions or concerns? Yes

## 2021-10-04 ENCOUNTER — Encounter: Payer: Self-pay | Admitting: Family Medicine

## 2021-10-12 DIAGNOSIS — G44099 Other trigeminal autonomic cephalgias (TAC), not intractable: Secondary | ICD-10-CM | POA: Diagnosis not present

## 2021-10-12 DIAGNOSIS — Z888 Allergy status to other drugs, medicaments and biological substances status: Secondary | ICD-10-CM | POA: Diagnosis not present

## 2021-10-12 DIAGNOSIS — R002 Palpitations: Secondary | ICD-10-CM | POA: Diagnosis not present

## 2021-10-12 DIAGNOSIS — Z7982 Long term (current) use of aspirin: Secondary | ICD-10-CM | POA: Diagnosis not present

## 2021-10-12 DIAGNOSIS — G8929 Other chronic pain: Secondary | ICD-10-CM | POA: Diagnosis not present

## 2021-10-12 DIAGNOSIS — M5481 Occipital neuralgia: Secondary | ICD-10-CM | POA: Diagnosis not present

## 2021-10-12 DIAGNOSIS — R200 Anesthesia of skin: Secondary | ICD-10-CM | POA: Diagnosis not present

## 2021-10-18 ENCOUNTER — Telehealth: Payer: Self-pay | Admitting: Family Medicine

## 2021-10-18 NOTE — Telephone Encounter (Signed)
Copied from [place] [id number]. Topic: Medicare AWV >> Oct 18, 2021  2:32 PM Josephina Gip wrote: Reason for CRM: Called patient to schedule AWV. Patient stated that she has not received her results from the bone density she had done 3 weeks ago. I advised patient I would send a message to clinical staff and someone would call her in regards to this.

## 2021-10-19 ENCOUNTER — Telehealth: Payer: Self-pay | Admitting: Family Medicine

## 2021-10-19 NOTE — Telephone Encounter (Signed)
Pt is calling to reschedule her AWV Please advise [phone]

## 2021-10-19 NOTE — Telephone Encounter (Signed)
Tried calling patient to reschedule appointment, no answer and no VM. Will try to call again.

## 2021-10-24 ENCOUNTER — Ambulatory Visit: Payer: Medicare Other

## 2021-10-26 DIAGNOSIS — I671 Cerebral aneurysm, nonruptured: Secondary | ICD-10-CM | POA: Diagnosis not present

## 2021-10-26 DIAGNOSIS — H5702 Anisocoria: Secondary | ICD-10-CM | POA: Diagnosis not present

## 2021-10-26 DIAGNOSIS — G44009 Cluster headache syndrome, unspecified, not intractable: Secondary | ICD-10-CM | POA: Diagnosis not present

## 2021-10-26 DIAGNOSIS — G44099 Other trigeminal autonomic cephalgias (TAC), not intractable: Secondary | ICD-10-CM | POA: Diagnosis not present

## 2021-10-26 DIAGNOSIS — G319 Degenerative disease of nervous system, unspecified: Secondary | ICD-10-CM | POA: Diagnosis not present

## 2021-10-29 ENCOUNTER — Telehealth: Payer: Self-pay | Admitting: Family Medicine

## 2021-10-29 NOTE — Telephone Encounter (Signed)
Copied from [place] [phone]. Topic: General - Call Back - No Documentation >> Oct 29, 2021  3:27 PM Oley Balm E wrote: Reason for CRM: Pt wants to discuss her MRI results (Brain test) and also has questions for office regarding her bone density.   Best contact: [phone]

## 2021-10-30 NOTE — Telephone Encounter (Signed)
Attempted to contact patient NA LVM for patient to call back.

## 2021-10-30 NOTE — Telephone Encounter (Signed)
Called office for Ceex Haci. Unable to contact. LM for her to call the pt back.

## 2021-11-01 NOTE — Telephone Encounter (Signed)
Returned patient call, patient states neurologist ordered MRI and has got in contact with their office for the results. Patient wanted to know results of DEXA scan, had done at [facility]. Will abstract into chart and place results in provider folder for review, patient would like a call back with results.

## 2021-11-05 DIAGNOSIS — M792 Neuralgia and neuritis, unspecified: Secondary | ICD-10-CM | POA: Diagnosis not present

## 2021-11-05 DIAGNOSIS — G44099 Other trigeminal autonomic cephalgias (TAC), not intractable: Secondary | ICD-10-CM | POA: Diagnosis not present

## 2021-11-08 ENCOUNTER — Other Ambulatory Visit: Payer: Self-pay | Admitting: Family Medicine

## 2021-11-08 MED ORDER — ALENDRONATE SODIUM 70 MG PO TABS: ORAL_TABLET | ORAL | 4 refills | Status: DC

## 2021-11-08 NOTE — Telephone Encounter (Signed)
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 I'm going to send her in a medicine to strengthen her bones. Thanks!

## 2021-11-08 NOTE — Telephone Encounter (Signed)
Yes- her bones are stronger than they were last time

## 2021-11-08 NOTE — Telephone Encounter (Signed)
Patient is aware of results, would like to know is the fosamax working due to patient being on medication since 2020.

## 2021-11-09 NOTE — Telephone Encounter (Signed)
Patient is aware of provider advise, no further questions.

## 2021-11-12 ENCOUNTER — Ambulatory Visit (INDEPENDENT_AMBULATORY_CARE_PROVIDER_SITE_OTHER): Payer: Medicare Other | Admitting: *Deleted

## 2021-11-12 DIAGNOSIS — Z0000 Encounter for general adult medical examination without abnormal findings: Secondary | ICD-10-CM

## 2021-11-12 NOTE — Progress Notes (Signed)
Subjective:   Casey Summers is a 76 y.o. female who presents for Medicare Annual (Subsequent) preventive examination.  I connected with  Casey Summers on 11/12/21 by a telephone enabled telemedicine application and verified that I am speaking with the correct person using two identifiers.   I discussed the limitations of evaluation and management by telemedicine. The patient expressed understanding and agreed to proceed.  Patient location: home  Provider location: Tele-health-home    Review of Systems     Cardiac Risk Factors include: advanced age (>65mn, >>46women);family history of premature cardiovascular disease;hypertension     Objective:    Today's Vitals   There is no height or weight on file to calculate BMI.     11/12/2021    9:35 AM 10/12/2019    4:40 PM 08/23/2019   11:21 AM 08/17/2018    1:56 PM 06/30/2017    2:05 PM  Advanced Directives  Does Patient Have a Medical Advance Directive? No Yes No Yes No  Type of Advance Directive  Living will;Healthcare Power of Attorney  Living will;Healthcare Power of Attorney   Copy of HHavanain Chart?    No - copy requested   Would patient like information on creating a medical advance directive? No - Patient declined    Yes (MAU/Ambulatory/Procedural Areas - Information given)    Current Medications (verified) Outpatient Encounter Medications as of 11/12/2021  Medication Sig   alendronate (FOSAMAX) 70 MG tablet TAKE 1 TABLET EVERY 7 DAYS. TAKE WITH A FULL GLASS OF WATER ON AN EMPTY STOMACH.   aspirin 81 MG tablet Take 81 mg by mouth.    Cholecalciferol 25 MCG (1000 UT) tablet Take by mouth.   losartan (COZAAR) 50 MG tablet Take 1 tablet (50 mg total) by mouth daily.   metoprolol succinate (TOPROL-XL) 25 MG 24 hr tablet Take 1 tablet (25 mg total) by mouth daily.   omeprazole (PRILOSEC) 20 MG capsule Take 1 capsule (20 mg total) by mouth daily.   sertraline (ZOLOFT) 100 MG tablet Take 1  tablet (100 mg total) by mouth daily.   traMADol (ULTRAM) 50 MG tablet Take 0.5-1 tablets (25-50 mg total) by mouth daily as needed. (Patient not taking: Reported on 08/29/2021)   No facility-administered encounter medications on file as of 11/12/2021.    Allergies (verified) Iron metal [iron] and Latex   History: Past Medical History:  Diagnosis Date   Allergy    Cancer ([place]    breast   Chronic mid back pain    Depression    Hypertension    Osteoporosis    Past Surgical History:  Procedure Laterality Date   BREAST LUMPECTOMY  01/29/2008   TOTAL ABDOMINAL HYSTERECTOMY W/ BILATERAL SALPINGOOPHORECTOMY     Family History  Problem Relation Age of Onset   Congestive Heart Failure Mother    Cancer Sister        breast   Social History   Socioeconomic History   Marital status: Divorced    Spouse name: Not on file   Number of children: Not on file   Years of education: Not on file   Highest education level: Associate degree: academic program  Occupational History   Occupation: retired  Tobacco Use   Smoking status: Former    Packs/day: 1.25    Years: 24.00    Total pack years: 30.00    Types: Cigarettes    Quit date: 08/28/2008    Years since quitting: 13.2   Smokeless  tobacco: Never  Vaping Use   Vaping Use: Never used  Substance and Sexual Activity   Alcohol use: Yes    Alcohol/week: 14.0 standard drinks of alcohol    Types: 14 Glasses of wine per week   Drug use: No   Sexual activity: Not Currently  Other Topics Concern   Not on file  Social History Narrative   Not on file   Social Determinants of Health   Financial Resource Strain: Low Risk  (11/12/2021)   Overall Financial Resource Strain (CARDIA)    Difficulty of Paying Living Expenses: Not hard at all  Food Insecurity: No Food Insecurity (11/12/2021)   Hunger Vital Sign    Worried About Running Out of Food in the Last Year: Never true    Ran Out of Food in the Last Year: Never true  Transportation  Needs: No Transportation Needs (11/12/2021)   PRAPARE - Hydrologist (Medical): No    Lack of Transportation (Non-Medical): No  Physical Activity: Insufficiently Active (11/12/2021)   Exercise Vital Sign    Days of Exercise per Week: 3 days    Minutes of Exercise per Session: 40 min  Stress: No Stress Concern Present (11/12/2021)   [place]    Feeling of Stress : Not at all  Social Connections: Socially Isolated (11/12/2021)   Social Connection and Isolation Panel [NHANES]    Frequency of Communication with Friends and Family: Once a week    Frequency of Social Gatherings with Friends and Family: Once a week    Attends Religious Services: Never    Marine scientist or Organizations: No    Attends Music therapist: Never    Marital Status: Divorced    Tobacco Counseling Counseling given: Not Answered   Clinical Intake:  Pre-visit preparation completed: Yes  Pain : No/denies pain     Diabetes: No  How often do you need to have someone help you when you read instructions, pamphlets, or other written materials from your doctor or pharmacy?: 1 - Never  Diabetic?  no  Interpreter Needed?: No  Information entered by :: Leroy Kennedy LPN   Activities of Daily Living    11/12/2021    9:48 AM  In your present state of health, do you have any difficulty performing the following activities:  Hearing? 0  Vision? 0  Difficulty concentrating or making decisions? 0  Walking or climbing stairs? 0  Dressing or bathing? 0  Doing errands, shopping? 0  Preparing Food and eating ? N  Using the Toilet? N  In the past six months, have you accidently leaked urine? N  Do you have problems with loss of bowel control? N  Managing your Medications? N  Managing your Finances? N  Housekeeping or managing your Housekeeping? N    Patient Care Team: Valerie Roys, DO as  PCP - General (Family Medicine) Ardell Isaacs, NP as Nurse Practitioner (Surgical Oncology)  Indicate any recent Medical Services you may have received from other than [facility] providers in the past year (date may be approximate).     Assessment:   This is a routine wellness examination for [name].  Hearing/Vision screen Hearing Screening - Comments:: No trouble hearing Vision Screening - Comments:: [facility] Up to date  Dietary issues and exercise activities discussed: Current Exercise Habits: Home exercise routine, Type of exercise: walking, Time (Minutes): 30, Frequency (Times/Week): 3, Weekly Exercise (Minutes/Week): 90, Intensity: Mild  Goals Addressed             This Visit's Progress    DIET - INCREASE WATER INTAKE   Not on track    Recommend drinking at least 6-8 glasses of water a day      Patient Stated       Get more organized       Depression Screen    11/12/2021    9:42 AM 08/29/2021   11:14 AM 05/29/2021   11:20 AM 10/20/2020    3:11 PM 06/29/2020    2:14 PM 12/14/2019   11:18 AM 08/23/2019   11:22 AM  PHQ 2/9 Scores  PHQ - 2 Score 0 1 1 0 1 2 0  PHQ- 9 Score '2 4 3  2 2 '$ 0    Fall Risk    11/12/2021    9:35 AM 10/20/2020    3:11 PM 06/29/2020    2:14 PM 08/23/2019   11:22 AM 02/22/2019   10:04 AM  Fall Risk   Falls in the past year? 1 0 0 0 0  Number falls in past yr: 0 0 0  0  Injury with Fall? 0 0 0  0  Risk for fall due to :   No Fall Risks Medication side effect   Follow up Falls evaluation completed;Education provided;Falls prevention discussed  Falls evaluation completed Falls evaluation completed;Education provided;Falls prevention discussed     FALL RISK PREVENTION PERTAINING TO THE HOME:  Any stairs in or around the home? No  If so, are there any without handrails? No  Home free of loose throw rugs in walkways, pet beds, electrical cords, etc? Yes  Adequate lighting in your home to reduce risk of falls? Yes   ASSISTIVE DEVICES UTILIZED TO  PREVENT FALLS:  Life alert? No  Use of a cane, walker or w/c? No  Grab bars in the bathroom? No  Shower chair or bench in shower? No  Elevated toilet seat or a handicapped toilet? Yes   TIMED UP AND GO:  Was the test performed? No .    Cognitive Function:        11/12/2021    9:38 AM 10/20/2020    3:13 PM 08/23/2019   11:24 AM 08/17/2018    1:57 PM 06/30/2017    2:05 PM  6CIT Screen  What Year? 0 points 0 points 0 points 0 points 0 points  What month? 0 points 0 points 0 points 0 points 0 points  What time? 0 points 0 points 0 points 0 points 0 points  Count back from 20 0 points 0 points 0 points 0 points 0 points  Months in reverse 0 points 0 points 0 points 0 points 0 points  Repeat phrase 0 points 0 points 4 points  0 points  Total Score 0 points 0 points 4 points  0 points    Immunizations Immunization History  Administered Date(s) Administered   Fluad Quad(high Dose 65+) 12/14/2019, 10/20/2020   Influenza, High Dose Seasonal PF 02/07/2016, 10/27/2017, 11/03/2018   Influenza,inj,Quad PF,6+ Mos 11/28/2014   PFIZER(Purple Top)SARS-COV-2 Vaccination 03/02/2019, 05/14/2019   Pneumococcal Conjugate-13 05/24/2015   Pneumococcal Polysaccharide-23 06/30/2017   Td 01/29/2004   Tdap 07/19/2015   Zoster, Live 10/22/2011, 09/07/2012    TDAP status: Up to date  Flu Vaccine status: Due, Education has been provided regarding the importance of this vaccine. Advised may receive this vaccine at local pharmacy or Health Dept. Aware to provide a copy of the vaccination record  if obtained from local pharmacy or Health Dept. Verbalized acceptance and understanding.  Pneumococcal vaccine status: Up to date  Covid-19 vaccine status: Information provided on how to obtain vaccines.   Qualifies for Shingles Vaccine? Yes   Zostavax completed Yes   Shingrix Completed?: No.    Education has been provided regarding the importance of this vaccine. Patient has been advised to call insurance  company to determine out of pocket expense if they have not yet received this vaccine. Advised may also receive vaccine at local pharmacy or Health Dept. Verbalized acceptance and understanding.  Screening Tests Health Maintenance  Topic Date Due   INFLUENZA VACCINE  08/28/2021   COVID-19 Vaccine (3 - Pfizer risk series) 11/28/2021 (Originally 06/11/2019)   Zoster Vaccines- Shingrix (1 of 2) 02/12/2022 (Originally 08/28/1964)   MAMMOGRAM  10/03/2022   COLONOSCOPY (Pts 45-22yr Insurance coverage will need to be confirmed)  03/20/2023   DEXA SCAN  09/18/2023   TETANUS/TDAP  07/18/2025   Pneumonia Vaccine 76 Years old  Completed   Hepatitis C Screening  Completed   HPV VACCINES  Aged Out    Health Maintenance  Health Maintenance Due  Topic Date Due   INFLUENZA VACCINE  08/28/2021    Colorectal cancer screening: Type of screening: Colonoscopy. Completed 2015. Repeat every 10 years  Mammogram status: Completed  . Repeat every year  Bone Density status: Completed 2023. Results reflect: Bone density results: OSTEOPOROSIS. Repeat every 2 years.  Lung Cancer Screening: (Low Dose CT Chest recommended if Age 74-80years, 30 pack-year currently smoking OR have quit w/in 15years.) does not qualify.   Lung Cancer Screening Referral:   Additional Screening:  Hepatitis C Screening: does not qualify; Completed 2017  Vision Screening: Recommended annual ophthalmology exams for early detection of glaucoma and other disorders of the eye. Is the patient up to date with their annual eye exam?  Yes  Who is the provider or what is the name of the office in which the patient attends annual eye exams? [facility] If pt is not established with a provider, would they like to be referred to a provider to establish care? No .   Dental Screening: Recommended annual dental exams for proper oral hygiene  Community Resource Referral / Chronic Care Management: CRR required this visit?  No   CCM required this  visit?  No      Plan:     I have personally reviewed and noted the following in the patient's chart:   Medical and social history Use of alcohol, tobacco or illicit drugs  Current medications and supplements including opioid prescriptions. Patient is not currently taking opioid prescriptions. Functional ability and status Nutritional status Physical activity Advanced directives List of other physicians Hospitalizations, surgeries, and ER visits in previous 12 months Vitals Screenings to include cognitive, depression, and falls Referrals and appointments  In addition, I have reviewed and discussed with patient certain preventive protocols, quality metrics, and best practice recommendations. A written personalized care plan for preventive services as well as general preventive health recommendations were provided to patient.     JLeroy Kennedy LPN   [id number]  Nurse Notes:

## 2021-11-12 NOTE — Patient Instructions (Signed)
[name] , Thank you for taking time to come for your Medicare Wellness Visit. I appreciate your ongoing commitment to your health goals. Please review the following plan we discussed and let me know if I can assist you in the future.   These are the goals we discussed:  Goals      DIET - INCREASE WATER INTAKE     Recommend drinking at least 6-8 glasses of water a day      Patient Stated     08/23/2019, wants to lose 5 pounds     Patient Stated     Get more organized        This is a list of the screening recommended for you and due dates:  Health Maintenance  Topic Date Due   Flu Shot  08/28/2021   COVID-19 Vaccine (3 - Pfizer risk series) 11/28/2021*   Zoster (Shingles) Vaccine (1 of 2) 02/12/2022*   Mammogram  10/03/2022   Colon Cancer Screening  03/20/2023   DEXA scan (bone density measurement)  09/18/2023   Tetanus Vaccine  07/18/2025   Pneumonia Vaccine  Completed   Hepatitis C Screening: USPSTF Recommendation to screen - Ages 55-79 yo.  Completed   HPV Vaccine  Aged Out  *Topic was postponed. The date shown is not the original due date.    Advanced directives: Education provided  Conditions/risks identified:    Preventive Care 50 Years and Older, Female Preventive care refers to lifestyle choices and visits with your health care provider that can promote health and wellness. What does preventive care include? A yearly physical exam. This is also called an annual well check. Dental exams once or twice a year. Routine eye exams. Ask your health care provider how often you should have your eyes checked. Personal lifestyle choices, including: Daily care of your teeth and gums. Regular physical activity. Eating a healthy diet. Avoiding tobacco and drug use. Limiting alcohol use. Practicing safe sex. Taking low-dose aspirin every day. Taking vitamin and mineral supplements as recommended by your health care provider. What happens during an annual well  check? The services and screenings done by your health care provider during your annual well check will depend on your age, overall health, lifestyle risk factors, and family history of disease. Counseling  Your health care provider may ask you questions about your: Alcohol use. Tobacco use. Drug use. Emotional well-being. Home and relationship well-being. Sexual activity. Eating habits. History of falls. Memory and ability to understand (cognition). Work and work Statistician. Reproductive health. Screening  You may have the following tests or measurements: Height, weight, and BMI. Blood pressure. Lipid and cholesterol levels. These may be checked every 5 years, or more frequently if you are over 64 years old. Skin check. Lung cancer screening. You may have this screening every year starting at age 56 if you have a 30-pack-year history of smoking and currently smoke or have quit within the past 15 years. Fecal occult blood test (FOBT) of the stool. You may have this test every year starting at age 25. Flexible sigmoidoscopy or colonoscopy. You may have a sigmoidoscopy every 5 years or a colonoscopy every 10 years starting at age 44. Hepatitis C blood test. Hepatitis B blood test. Sexually transmitted disease (STD) testing. Diabetes screening. This is done by checking your blood sugar (glucose) after you have not eaten for a while (fasting). You may have this done every 1-3 years. Bone density scan. This is done to screen for osteoporosis. You may have  this done starting at age 80. Mammogram. This may be done every 1-2 years. Talk to your health care provider about how often you should have regular mammograms. Talk with your health care provider about your test results, treatment options, and if necessary, the need for more tests. Vaccines  Your health care provider may recommend certain vaccines, such as: Influenza vaccine. This is recommended every year. Tetanus, diphtheria, and  acellular pertussis (Tdap, Td) vaccine. You may need a Td booster every 10 years. Zoster vaccine. You may need this after age 70. Pneumococcal 13-valent conjugate (PCV13) vaccine. One dose is recommended after age 65. Pneumococcal polysaccharide (PPSV23) vaccine. One dose is recommended after age 72. Talk to your health care provider about which screenings and vaccines you need and how often you need them. This information is not intended to replace advice given to you by your health care provider. Make sure you discuss any questions you have with your health care provider. Document Released: 02/10/2015 Document Revised: 10/04/2015 Document Reviewed: 11/15/2014 Elsevier Interactive Patient Education  2017 [employer] Prevention in the Home Falls can cause injuries. They can happen to people of all ages. There are many things you can do to make your home safe and to help prevent falls. What can I do on the outside of my home? Regularly fix the edges of walkways and driveways and fix any cracks. Remove anything that might make you trip as you walk through a door, such as a raised step or threshold. Trim any bushes or trees on the path to your home. Use bright outdoor lighting. Clear any walking paths of anything that might make someone trip, such as rocks or tools. Regularly check to see if handrails are loose or broken. Make sure that both sides of any steps have handrails. Any raised decks and porches should have guardrails on the edges. Have any leaves, snow, or ice cleared regularly. Use sand or salt on walking paths during winter. Clean up any spills in your garage right away. This includes oil or grease spills. What can I do in the bathroom? Use night lights. Install grab bars by the toilet and in the tub and shower. Do not use towel bars as grab bars. Use non-skid mats or decals in the tub or shower. If you need to sit down in the shower, use a plastic, non-slip stool. Keep  the floor dry. Clean up any water that spills on the floor as soon as it happens. Remove soap buildup in the tub or shower regularly. Attach bath mats securely with double-sided non-slip rug tape. Do not have throw rugs and other things on the floor that can make you trip. What can I do in the bedroom? Use night lights. Make sure that you have a light by your bed that is easy to reach. Do not use any sheets or blankets that are too big for your bed. They should not hang down onto the floor. Have a firm chair that has side arms. You can use this for support while you get dressed. Do not have throw rugs and other things on the floor that can make you trip. What can I do in the kitchen? Clean up any spills right away. Avoid walking on wet floors. Keep items that you use a lot in easy-to-reach places. If you need to reach something above you, use a strong step stool that has a grab bar. Keep electrical cords out of the way. Do not use floor polish or  wax that makes floors slippery. If you must use wax, use non-skid floor wax. Do not have throw rugs and other things on the floor that can make you trip. What can I do with my stairs? Do not leave any items on the stairs. Make sure that there are handrails on both sides of the stairs and use them. Fix handrails that are broken or loose. Make sure that handrails are as long as the stairways. Check any carpeting to make sure that it is firmly attached to the stairs. Fix any carpet that is loose or worn. Avoid having throw rugs at the top or bottom of the stairs. If you do have throw rugs, attach them to the floor with carpet tape. Make sure that you have a light switch at the top of the stairs and the bottom of the stairs. If you do not have them, ask someone to add them for you. What else can I do to help prevent falls? Wear shoes that: Do not have high heels. Have rubber bottoms. Are comfortable and fit you well. Are closed at the toe. Do not  wear sandals. If you use a stepladder: Make sure that it is fully opened. Do not climb a closed stepladder. Make sure that both sides of the stepladder are locked into place. Ask someone to hold it for you, if possible. Clearly mark and make sure that you can see: Any grab bars or handrails. First and last steps. Where the edge of each step is. Use tools that help you move around (mobility aids) if they are needed. These include: Canes. Walkers. Scooters. Crutches. Turn on the lights when you go into a dark area. Replace any light bulbs as soon as they burn out. Set up your furniture so you have a clear path. Avoid moving your furniture around. If any of your floors are uneven, fix them. If there are any pets around you, be aware of where they are. Review your medicines with your doctor. Some medicines can make you feel dizzy. This can increase your chance of falling. Ask your doctor what other things that you can do to help prevent falls. This information is not intended to replace advice given to you by your health care provider. Make sure you discuss any questions you have with your health care provider. Document Released: 11/10/2008 Document Revised: 06/22/2015 Document Reviewed: 02/18/2014 Elsevier Interactive Patient Education  2017 [employer].

## 2022-01-11 DIAGNOSIS — M792 Neuralgia and neuritis, unspecified: Secondary | ICD-10-CM | POA: Diagnosis not present

## 2022-01-11 DIAGNOSIS — G8929 Other chronic pain: Secondary | ICD-10-CM | POA: Diagnosis not present

## 2022-01-11 DIAGNOSIS — R519 Headache, unspecified: Secondary | ICD-10-CM | POA: Diagnosis not present

## 2022-01-24 ENCOUNTER — Other Ambulatory Visit: Payer: Self-pay | Admitting: Family Medicine

## 2022-01-24 DIAGNOSIS — I10 Essential (primary) hypertension: Secondary | ICD-10-CM

## 2022-01-25 NOTE — Telephone Encounter (Signed)
Requested Prescriptions  Pending Prescriptions Disp Refills   metoprolol succinate (TOPROL-XL) 25 MG 24 hr tablet [Pharmacy Med Name: METOPROLOL SUCC ER 25 MG TAB] 90 tablet 0    Sig: TAKE 1 TABLET (25 MG TOTAL) BY MOUTH DAILY.     Cardiovascular:  Beta Blockers Passed - 01/24/2022  2:06 AM      Passed - Last BP in normal range    BP Readings from Last 1 Encounters:  08/29/21 115/70         Passed - Last Heart Rate in normal range    Pulse Readings from Last 1 Encounters:  08/29/21 71         Passed - Valid encounter within last 6 months    Recent Outpatient Visits           4 months ago Primary hypertension   [place], [place], DO   8 months ago Primary hypertension   [place], Megan P, DO   1 year ago Encounter for annual wellness exam in Medicare patient   [place], [place], DO   1 year ago Primary hypertension   [place], Megan P, DO   2 years ago Chronic left-sided low back pain with left-sided sciatica   [facility], Megan P, DO

## 2022-03-25 DIAGNOSIS — R519 Headache, unspecified: Secondary | ICD-10-CM | POA: Diagnosis not present

## 2022-03-25 DIAGNOSIS — Z7982 Long term (current) use of aspirin: Secondary | ICD-10-CM | POA: Diagnosis not present

## 2022-03-25 DIAGNOSIS — R9431 Abnormal electrocardiogram [ECG] [EKG]: Secondary | ICD-10-CM | POA: Diagnosis not present

## 2022-03-25 DIAGNOSIS — R748 Abnormal levels of other serum enzymes: Secondary | ICD-10-CM | POA: Diagnosis not present

## 2022-03-28 ENCOUNTER — Ambulatory Visit: Payer: Self-pay

## 2022-03-28 NOTE — Telephone Encounter (Signed)
  Chief Complaint: HA Symptoms: HA and L sided head and face pain, 10+/10, constant Frequency: since Sept Pertinent Negatives: NA Disposition: []$ ED /[]$ Urgent Care (no appt availability in office) / [x]$ Appointment(In office/virtual)/ []$  [place] [facility]/ []$ Home Care/ []$ Refused Recommended Disposition /[]$ [place] [facility]/ []$  Follow-up with PCP Additional Notes: pt states she has been dealing with this pain since Sept 2023, has went to Neuro and just given gabapentin and keeps uping dose but not really helping. Went to ED on 03/25/22 and just advised to see PCP. No appts available with Dr. Wynetta Emery until 04/23/22, spoke with Nira Conn, [facility] and advised no appts today or tomorrow as pt requested so asked pt if she would be willing to see another provider. Scheduled OV with Junie Panning, PA tomorrow at 1000.   Reason for Disposition  [1] SEVERE headache (e.g., excruciating) AND [2] not improved after 2 hours of pain medicine  Answer Assessment - Initial Assessment Questions 1. LOCATION: "Where does it hurt?"      Left side of head 2. ONSET: "When did the headache start?" (Minutes, hours or days)      Since Sept  3. PATTERN: "Does the pain come and go, or has it been constant since it started?"     Constant  4. SEVERITY: "How bad is the pain?" and "What does it keep you from doing?"  (e.g., Scale 1-10; mild, moderate, or severe)   - MILD (1-3): doesn't interfere with normal activities    - MODERATE (4-7): interferes with normal activities or awakens from sleep    - SEVERE (8-10): excruciating pain, unable to do any normal activities        10 7. MIGRAINE: "Have you been diagnosed with migraine headaches?" If Yes, ask: "Is this headache similar?"      yes 9. OTHER SYMPTOMS: "Do you have any other symptoms?" (fever, stiff neck, eye pain, sore throat, cold symptoms)     Pain on L side of face and head  Protocols used: Headache-A-AH

## 2022-03-29 ENCOUNTER — Ambulatory Visit: Payer: PRIVATE HEALTH INSURANCE | Admitting: Physician Assistant

## 2022-03-29 ENCOUNTER — Ambulatory Visit (INDEPENDENT_AMBULATORY_CARE_PROVIDER_SITE_OTHER): Payer: Medicare Other | Admitting: Physician Assistant

## 2022-03-29 ENCOUNTER — Encounter: Payer: Self-pay | Admitting: Physician Assistant

## 2022-03-29 VITALS — BP 131/84 | HR 73 | Temp 98.7°F | Wt 123.4 lb

## 2022-03-29 DIAGNOSIS — G500 Trigeminal neuralgia: Secondary | ICD-10-CM

## 2022-03-29 DIAGNOSIS — R519 Headache, unspecified: Secondary | ICD-10-CM | POA: Diagnosis not present

## 2022-03-29 DIAGNOSIS — G8929 Other chronic pain: Secondary | ICD-10-CM | POA: Diagnosis not present

## 2022-03-29 DIAGNOSIS — K219 Gastro-esophageal reflux disease without esophagitis: Secondary | ICD-10-CM

## 2022-03-29 MED ORDER — CARBAMAZEPINE ER 100 MG PO TB12: 100.0000 mg | ORAL_TABLET | Freq: Every day | ORAL | 2 refills | Status: DC

## 2022-03-29 MED ORDER — OMEPRAZOLE 20 MG PO CPDR: 20.0000 mg | DELAYED_RELEASE_CAPSULE | Freq: Every day | ORAL | 1 refills | Status: DC

## 2022-03-29 NOTE — Progress Notes (Unsigned)
Acute Office Visit   Patient: Casey Summers   DOB: 01/29/1946   77 y.o. Female  MRN: [id number] Visit Date: 03/29/2022  Today's healthcare provider: Dani Gobble Nikos Anglemyer, PA-C  Introduced myself to the patient as a Journalist, newspaper and provided education on APPs in clinical practice.    Chief Complaint  Patient presents with   Headache    Pt states she has been having headaches since this past August. States she has been seeing Neurology and has been given Gabapentin. States the gabapentin is not helping. States she went to the ER on Monday at [facility] for the headaches. States she is having the headaches on the L side, states that she was told that she may have trigeminal neuralgia. States she has been to the dentist and chiropractor as well for this.    Subjective    Headache  Associated symptoms include neck pain. Pertinent negatives include no dizziness, fever, numbness or weakness.   HPI     Headache    Additional comments: Pt states she has been having headaches since this past August. States she has been seeing Neurology and has been given Gabapentin. States the gabapentin is not helping. States she went to the ER on Monday at [facility] for the headaches. States she is having the headaches on the L side, states that she was told that she may have trigeminal neuralgia. States she has been to the dentist and chiropractor as well for this.       Last edited by Georgina Peer, CMA on 03/29/2022  2:10 PM.      Headache and left sided facial pain  She is here with her daughter who is helping with HPI   Onset: sudden and persistent  Duration: since Aug 2023   Reports this has been ongoing since Aug of last year Reports she gets sporadic, stabbing headaches on the left side of her head that radiate into the left side of her neck  She has been seeing Neurology who has given her Gabapentin, auricotemporal nerve block injections,  She reports headaches have now become constant  Associated  symptoms: Numbness: none  Tingling: none Reports some left eye twitching when pain is at peak Timing: unaware of triggers  Weakness: none  Facial drooping: none Speech changes: none  Lacrimation: none Sinus involvement: none Interventions: Neurology recommendations- Gabapentin and Galcanezumab injection, she has been to Dentist to make sure there were no oral or teeth issues, Acupuncture, and chiropractor   She reports that the ED treatments provided limited relief but pain returned as she was on her way home   Medications: Outpatient Medications Prior to Visit  Medication Sig   alendronate (FOSAMAX) 70 MG tablet TAKE 1 TABLET EVERY 7 DAYS. TAKE WITH A FULL GLASS OF WATER ON AN EMPTY STOMACH.   aspirin 81 MG tablet Take 81 mg by mouth.    Cholecalciferol 25 MCG (1000 UT) tablet Take by mouth.   EMGALITY, 300 MG DOSE, 100 MG/ML SOSY Inject into the skin.   gabapentin (NEURONTIN) 300 MG capsule Take 900 mg by mouth 3 (three) times daily.   losartan (COZAAR) 50 MG tablet Take 1 tablet (50 mg total) by mouth daily.   metoprolol succinate (TOPROL-XL) 25 MG 24 hr tablet TAKE 1 TABLET (25 MG TOTAL) BY MOUTH DAILY.   sertraline (ZOLOFT) 100 MG tablet Take 1 tablet (100 mg total) by mouth daily.   [DISCONTINUED] omeprazole (PRILOSEC) 20 MG capsule Take 1  capsule (20 mg total) by mouth daily.   [DISCONTINUED] traMADol (ULTRAM) 50 MG tablet Take 0.5-1 tablets (25-50 mg total) by mouth daily as needed. (Patient not taking: Reported on 08/29/2021)   No facility-administered medications prior to visit.    Review of Systems  Constitutional:  Negative for chills, fatigue and fever.  Cardiovascular:  Negative for chest pain and palpitations.       Denies cold feet or hands Denies claudication symptoms  Musculoskeletal:  Positive for neck pain.  Neurological:  Positive for headaches. Negative for dizziness, tremors, facial asymmetry, speech difficulty, weakness, light-headedness and numbness.        Objective    BP 131/84 (BP Location: Left Arm, Cuff Size: Small)   Pulse 73   Temp 98.7 F (37.1 C) (Oral)   Wt 123 lb 6.4 oz (56 kg)   SpO2 97%   BMI 24.51 kg/m    Physical Exam Vitals reviewed.  Constitutional:      General: She is awake.     Appearance: Normal appearance. She is well-developed and well-groomed.  HENT:     Head: Normocephalic and atraumatic.  Eyes:     General: Lids are normal. Gaze aligned appropriately.     Extraocular Movements: Extraocular movements intact.     Conjunctiva/sclera: Conjunctivae normal.     Pupils: Pupils are equal, round, and reactive to light.  Pulmonary:     Effort: Pulmonary effort is normal.  Neurological:     General: No focal deficit present.     Mental Status: She is alert and oriented to person, place, and time.     GCS: GCS eye subscore is 4. GCS verbal subscore is 5. GCS motor subscore is 6.     Cranial Nerves: Cranial nerves 2-12 are intact. No dysarthria or facial asymmetry.     Comments: Patient is continuously twitching and moving in chair in attempt to get comfortable   Psychiatric:        Attention and Perception: Attention and perception normal.        Mood and Affect: Mood normal.        Speech: Speech normal.        Behavior: Behavior normal. Behavior is cooperative.       Results for orders placed or performed in visit on 03/29/22  CBC w/Diff  Result Value Ref Range   WBC 4.6 3.4 - 10.8 x10E3/uL   RBC 3.61 (L) 3.77 - 5.28 x10E6/uL   Hemoglobin 13.0 11.1 - 15.9 g/dL   Hematocrit 37.3 34.0 - 46.6 %   MCV 103 (H) 79 - 97 fL   MCH 36.0 (H) 26.6 - 33.0 pg   MCHC 34.9 31.5 - 35.7 g/dL   RDW 11.8 11.7 - 15.4 %   Platelets 172 150 - 450 x10E3/uL   Neutrophils 64 Not Estab. %   Lymphs 20 Not Estab. %   Monocytes 12 Not Estab. %   Eos 2 Not Estab. %   Basos 1 Not Estab. %   Neutrophils Absolute 2.9 1.4 - 7.0 x10E3/uL   Lymphocytes Absolute 0.9 0.7 - 3.1 x10E3/uL   Monocytes Absolute 0.5 0.1 - 0.9  x10E3/uL   EOS (ABSOLUTE) 0.1 0.0 - 0.4 x10E3/uL   Basophils Absolute 0.0 0.0 - 0.2 x10E3/uL   Immature Granulocytes 1 Not Estab. %   Immature Grans (Abs) 0.1 0.0 - 0.1 x10E3/uL  Comp Met (CMET)  Result Value Ref Range   Glucose 80 70 - 99 mg/dL   BUN 6 (L) 8 -  27 mg/dL   Creatinine, Ser 0.57 0.57 - 1.00 mg/dL   eGFR 95 >59 mL/min/1.73   BUN/Creatinine Ratio 11 (L) 12 - 28   Sodium 143 134 - 144 mmol/L   Potassium 4.4 3.5 - 5.2 mmol/L   Chloride 106 96 - 106 mmol/L   CO2 22 20 - 29 mmol/L   Calcium 9.3 8.7 - 10.3 mg/dL   Total Protein 6.5 6.0 - 8.5 g/dL   Albumin 4.6 3.8 - 4.8 g/dL   Globulin, Total 1.9 1.5 - 4.5 g/dL   Albumin/Globulin Ratio 2.4 (H) 1.2 - 2.2   Bilirubin Total 0.3 0.0 - 1.2 mg/dL   Alkaline Phosphatase 101 44 - 121 IU/L   AST 156 (H) 0 - 40 IU/L   ALT 145 (H) 0 - 32 IU/L  Sed Rate (ESR)  Result Value Ref Range   Sed Rate 4 0 - 40 mm/hr  C-reactive protein  Result Value Ref Range   CRP 4 0 - 10 mg/L    Assessment & Plan      No follow-ups on file.      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    See chronic intractable headache for A&P       Relevant Medications   gabapentin (NEURONTIN) 300 MG capsule   carbamazepine (TEGRETOL-XR) 100 MG 12 hr tablet     Other   Chronic intractable headache - Primary    Chronic, ongoing, poorly controlled  Patient reports she is having intense, intractable headaches since Aug 2023 - she has been evaluated several times by Neurology and given Gabapentin as well as auricotemporal nerve blocks which have not been successful at minimizing symptoms Given HPI and PE - I suspect potential trigeminal neuralgia, [facility] 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 CBC, CMP for baseline obtained today  Will also get CRP and ESR to help rule out potential GCA   Patient has upcoming apt with PCP in a few weeks- follow up with PCP for response to changes made today       Relevant Medications   gabapentin (NEURONTIN) 300 MG capsule   EMGALITY, 300 MG DOSE, 100 MG/ML SOSY   carbamazepine (TEGRETOL-XR) 100 MG 12 hr tablet   Other Relevant Orders   CBC w/Diff (Completed)   Comp Met (CMET) (Completed)   Sed Rate (ESR) (Completed)   C-reactive protein (Completed)   Other Visit Diagnoses     Gastroesophageal reflux disease, unspecified whether esophagitis present       Relevant Medications   omeprazole (PRILOSEC) 20 MG capsule        No follow-ups on file.   I, Maguadalupe Lata E Tykwon Fera, PA-C, have reviewed all documentation for this visit. The documentation on 04/01/22 for the exam, diagnosis, procedures, and orders are all accurate and complete.   Talitha Givens, MHS, PA-C [facility]

## 2022-03-30 LAB — COMPREHENSIVE METABOLIC PANEL
ALT: 145 IU/L — ABNORMAL HIGH (ref 0–32)
AST: 156 IU/L — ABNORMAL HIGH (ref 0–40)
Albumin/Globulin Ratio: 2.4 — ABNORMAL HIGH (ref 1.2–2.2)
Albumin: 4.6 g/dL (ref 3.8–4.8)
Alkaline Phosphatase: 101 IU/L (ref 44–121)
BUN/Creatinine Ratio: 11 — ABNORMAL LOW (ref 12–28)
BUN: 6 mg/dL — ABNORMAL LOW (ref 8–27)
Bilirubin Total: 0.3 mg/dL (ref 0.0–1.2)
CO2: 22 mmol/L (ref 20–29)
Calcium: 9.3 mg/dL (ref 8.7–10.3)
Chloride: 106 mmol/L (ref 96–106)
Creatinine, Ser: 0.57 mg/dL (ref 0.57–1.00)
Globulin, Total: 1.9 g/dL (ref 1.5–4.5)
Glucose: 80 mg/dL (ref 70–99)
Potassium: 4.4 mmol/L (ref 3.5–5.2)
Sodium: 143 mmol/L (ref 134–144)
Total Protein: 6.5 g/dL (ref 6.0–8.5)
eGFR: 95 mL/min/{1.73_m2} (ref >59)

## 2022-03-30 LAB — C-REACTIVE PROTEIN: CRP: 4 mg/L (ref 0–10)

## 2022-03-30 LAB — CBC WITH DIFFERENTIAL/PLATELET
Basophils Absolute: 0 10*3/uL (ref 0.0–0.2)
Basos: 1 %
EOS (ABSOLUTE): 0.1 10*3/uL (ref 0.0–0.4)
Eos: 2 %
Hematocrit: 37.3 % (ref 34.0–46.6)
Hemoglobin: 13 g/dL (ref 11.1–15.9)
Immature Grans (Abs): 0.1 10*3/uL (ref 0.0–0.1)
Immature Granulocytes: 1 %
Lymphocytes Absolute: 0.9 10*3/uL (ref 0.7–3.1)
Lymphs: 20 %
MCH: 36 pg — ABNORMAL HIGH (ref 26.6–33.0)
MCHC: 34.9 g/dL (ref 31.5–35.7)
MCV: 103 fL — ABNORMAL HIGH (ref 79–97)
Monocytes Absolute: 0.5 10*3/uL (ref 0.1–0.9)
Monocytes: 12 %
Neutrophils Absolute: 2.9 10*3/uL (ref 1.4–7.0)
Neutrophils: 64 %
Platelets: 172 10*3/uL (ref 150–450)
RBC: 3.61 x10E6/uL — ABNORMAL LOW (ref 3.77–5.28)
RDW: 11.8 % (ref 11.7–15.4)
WBC: 4.6 10*3/uL (ref 3.4–10.8)

## 2022-03-30 LAB — SEDIMENTATION RATE: Sed Rate: 4 mm/hr (ref 0–40)

## 2022-04-01 ENCOUNTER — Ambulatory Visit: Payer: Self-pay

## 2022-04-01 DIAGNOSIS — G8929 Other chronic pain: Secondary | ICD-10-CM | POA: Insufficient documentation

## 2022-04-01 DIAGNOSIS — G500 Trigeminal neuralgia: Secondary | ICD-10-CM | POA: Insufficient documentation

## 2022-04-01 NOTE — Telephone Encounter (Signed)
This was started last week. It's going to take some time to work. I do not recommend increasing the dose at this time.

## 2022-04-01 NOTE — Progress Notes (Signed)
Your labs have returned and overall appear to be normal and stable with the exception of your liver enzymes Theses were elevated in the ED but appear to be coming down a bit at this time. I recommend we recheck these in about 2 weeks to see if they are returning to normal. If not we should order some imaging to make sure there isn't anything concerning.

## 2022-04-01 NOTE — Assessment & Plan Note (Signed)
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 CBC, CMP for baseline obtained today  Will also get CRP and ESR to help rule out potential GCA  Patient has upcoming apt with PCP in a few weeks- follow up with PCP for response to changes made today

## 2022-04-01 NOTE — Telephone Encounter (Signed)
Left message for patient to give our office a call back to discuss Dr Durenda Age recommendations. Advised patient to give our office a call back to discuss.   OK for [facility] to give note if patient calls back.

## 2022-04-01 NOTE — Telephone Encounter (Signed)
  Chief Complaint: Trigeminal neuralgia Symptoms: pain Frequency:  Pertinent Negatives: Patient denies  Disposition: '[]'$ ED /'[]'$ Urgent Care (no appt availability in office) / '[]'$ Appointment(In office/virtual)/ '[]'$  [facility]/ '[]'$ Home Care/ '[]'$ Refused Recommended Disposition /'[]'$ [facility]/ '[x]'$  Follow-up with PCP Additional Notes: Pt started new medication, Tegretol, on Friday and is tolerating this well. Pt states that current dose is not very effective. PT would like dosage increased. Pt also states that she is weaning herself off of gabapentin as it was not effective at all.     Summary: carbamazepine (TEGRETOL-XR) 100 MG 12 hr tablet increase dosage?   The patient called in wanting to speak with her provider about the carbamazepine (TEGRETOL-XR) 100 MG 12 hr tablet. She is also requesting for the provider to increase the dosage. Please assist patient further.       Reason for Disposition  Caller requesting a CONTROLLED substance prescription refill (e.g., narcotics, ADHD medicines)  Answer Assessment - Initial Assessment Questions 1. DRUG NAME: "What medicine do you need to have refilled?"     Tegretol  2. REFILLS REMAINING: "How many refills are remaining?" (Note: The label on the medicine or pill bottle will show how many refills are remaining. If there are no refills remaining, then a renewal may be needed.)      3. EXPIRATION DATE: "What is the expiration date?" (Note: The label states when the prescription will expire, and thus can no longer be refilled.)      4. PRESCRIBING HCP: "Who prescribed it?" Reason: If prescribed by specialist, call should be referred to that group.     Erin Mecum 5. SYMPTOMS: "Do you have any symptoms?"      6. PREGNANCY: "Is there any chance that you are pregnant?" "When was your last menstrual period?"  Protocols used: Medication Refill and Renewal Call-A-AH

## 2022-04-01 NOTE — Assessment & Plan Note (Signed)
See chronic intractable headache for A&P

## 2022-04-02 ENCOUNTER — Telehealth: Payer: Self-pay | Admitting: Family Medicine

## 2022-04-02 NOTE — Telephone Encounter (Signed)
Patient seems so upset that none of the treatments she has tried for her head pain have worked. Appointment scheduled per provider recommendation

## 2022-04-02 NOTE — Telephone Encounter (Signed)
Copied from [place] [phone]. Topic: General - Inquiry >> Apr 02, 2022 11:50 AM Penni Bombard wrote: Reason for CRM: pt called with head pain.  She is seeing a neurologist for this.  She takes Gabapentin but it is not helping.  Pt was in Friday and Junie Panning but nothing is helping.  Please advise.  [phone]

## 2022-04-02 NOTE — Telephone Encounter (Signed)
Will need to see neurology or have appt

## 2022-04-02 NOTE — Telephone Encounter (Signed)
Called patient to make her aware of Dr.Johnson's recommendations. Unable to leave message due to voicemail been full.   OK for [facility] to give note if patient calls back.

## 2022-04-03 ENCOUNTER — Encounter: Payer: Self-pay | Admitting: Family Medicine

## 2022-04-03 ENCOUNTER — Ambulatory Visit (INDEPENDENT_AMBULATORY_CARE_PROVIDER_SITE_OTHER): Payer: Medicare Other | Admitting: Family Medicine

## 2022-04-03 VITALS — BP 141/83 | HR 80 | Temp 98.5°F | Wt 123.1 lb

## 2022-04-03 DIAGNOSIS — F3342 Major depressive disorder, recurrent, in full remission: Secondary | ICD-10-CM

## 2022-04-03 DIAGNOSIS — I700 Atherosclerosis of aorta: Secondary | ICD-10-CM | POA: Diagnosis not present

## 2022-04-03 DIAGNOSIS — R8281 Pyuria: Secondary | ICD-10-CM

## 2022-04-03 DIAGNOSIS — R519 Headache, unspecified: Secondary | ICD-10-CM

## 2022-04-03 DIAGNOSIS — G8929 Other chronic pain: Secondary | ICD-10-CM

## 2022-04-03 DIAGNOSIS — I10 Essential (primary) hypertension: Secondary | ICD-10-CM

## 2022-04-03 LAB — URINALYSIS, ROUTINE W REFLEX MICROSCOPIC
Glucose, UA: NEGATIVE
Nitrite, UA: NEGATIVE
Protein,UA: NEGATIVE
RBC, UA: NEGATIVE
Specific Gravity, UA: 1.015 (ref 1.005–1.030)
Urobilinogen, Ur: 0.2 mg/dL (ref 0.2–1.0)
pH, UA: 7 (ref 5.0–7.5)

## 2022-04-03 LAB — MICROSCOPIC EXAMINATION: Bacteria, UA: NONE SEEN

## 2022-04-03 LAB — MICROALBUMIN, URINE WAIVED
Creatinine, Urine Waived: 50 mg/dL (ref 10–300)
Microalb, Ur Waived: 30 mg/L — ABNORMAL HIGH (ref 0–19)

## 2022-04-03 MED ORDER — PREDNISONE 10 MG PO TABS: ORAL_TABLET | ORAL | 0 refills | Status: DC

## 2022-04-03 MED ORDER — VENLAFAXINE HCL ER 150 MG PO CP24: 150.0000 mg | ORAL_CAPSULE | Freq: Every day | ORAL | 3 refills | Status: DC

## 2022-04-03 MED ORDER — PROPRANOLOL HCL ER 80 MG PO CP24: 80.0000 mg | ORAL_CAPSULE | Freq: Every day | ORAL | 3 refills | Status: DC

## 2022-04-03 MED ORDER — LOSARTAN POTASSIUM 50 MG PO TABS: 50.0000 mg | ORAL_TABLET | Freq: Every day | ORAL | 1 refills | Status: DC

## 2022-04-03 MED ORDER — TRIAMCINOLONE ACETONIDE 40 MG/ML IJ SUSP
40.0000 mg | Freq: Once | INTRAMUSCULAR | Status: AC
  Administered 2022-04-03: 40 mg via INTRAMUSCULAR

## 2022-04-03 NOTE — Assessment & Plan Note (Addendum)
Will change from metoprolol to propranolol to see if we can help with the headache. Follow up 2-3 weeks. Labs drawn today.

## 2022-04-03 NOTE — Assessment & Plan Note (Signed)
Cannot see her neurologist until April. Will continue her gabapentin and carbamezapine. Will start her on effexor and propranolol. Continue emgality. Prednisone taper started today. ESR was normal, but given temporal/parietal pain if significantly better, will get her into general surgery for TA bx to r/o Temporal artertitis. Recheck 2-3 weeks. Call with any concerns.

## 2022-04-03 NOTE — Progress Notes (Signed)
BP (!) 141/83 (BP Location: Left Arm, Cuff Size: Normal)   Pulse 80   Temp 98.5 F (36.9 C) (Oral)   Wt 123 lb 1.6 oz (55.8 kg)   SpO2 98%   BMI 24.45 kg/m    Subjective:    Patient ID: Casey Summers, female    DOB: 14-Jul-1945, 77 y.o.   MRN: [id number]  HPI: Casey Summers is a 77 y.o. female  Chief Complaint  Patient presents with   Head Pain    Patient says she has been having ongoing issues since last August. Patient says she was prescribed Carbamazepine last Friday by Junie Panning. Patient says she has been to the ER 3 times. Patient says the medication isn't helping. Patient says she received a nerve block while in the ER.  Patient says the nerve block helped a little and then prescribed Gabapentin and says nothing seems to help.    MIGRAINES Duration: 6 months Onset: sudden Severity: severe Quality: sharp, stabbing Frequency: intermittent Location: L side of her head Headache duration: hours Radiation: into her neck Headache status at time of visit: current headache Treatments attempted: Treatments attempted: rest, ice, heat, APAP, ibuprofen, and aleve", excedrine   Aura: no Nausea:  no Vomiting: no Photophobia:  no Phonophobia:  no Effect on social functioning:  yes Confusion:  no Gait disturbance/ataxia:  no Behavioral changes:  no Fevers:  no  DEPRESSION Mood status: stable Satisfied with current treatment?: yes Symptom severity: mild  Duration of current treatment : chronic Side effects: no Medication compliance: excellent compliance Psychotherapy/counseling: no  Previous psychiatric medications: sertraline Depressed mood: no Anxious mood: no Anhedonia: no Significant weight loss or gain: no Insomnia: no hard to stay asleep Fatigue: no Feelings of worthlessness or guilt: no Impaired concentration/indecisiveness: no Suicidal ideations: no Hopelessness: no Crying spells: no    03/29/2022    2:15 PM 11/12/2021    9:42 AM 08/29/2021   11:14 AM  05/29/2021   11:20 AM 10/20/2020    3:11 PM  Depression screen PHQ 2/9  Decreased Interest 0 0 0 0 0  Down, Depressed, Hopeless 0 0 1 1 0  PHQ - 2 Score 0 0 1 1 0  Altered sleeping 0 '1 1 1   '$ Tired, decreased energy 0 '1 1 1   '$ Change in appetite 0 0 1 0   Feeling bad or failure about yourself  0 0 0 0   Trouble concentrating 0 0 0 0   Moving slowly or fidgety/restless 0 0 0 0   Suicidal thoughts 0 0 0 0   PHQ-9 Score 0 '2 4 3   '$ Difficult doing work/chores Not difficult at all Not difficult at all      HYPERTENSION / [place] Satisfied with current treatment? yes Duration of hypertension: chronic BP monitoring frequency: not checking BP medication side effects: no Past BP meds: metoprolol and losartan Duration of hyperlipidemia: chronic Cholesterol medication side effects: no Cholesterol supplements: none Past cholesterol medications: none Medication compliance: excellent compliance Aspirin: yes Recent stressors: yes Recurrent headaches: yes Visual changes: no Palpitations: no Dyspnea: no Chest pain: no Lower extremity edema: no Dizzy/lightheaded: no   Relevant past medical, surgical, family and social history reviewed and updated as indicated. Interim medical history since our last visit reviewed. Allergies and medications reviewed and updated.  Review of Systems  Constitutional: Negative.   Respiratory: Negative.    Cardiovascular: Negative.   Gastrointestinal: Negative.   Neurological:  Positive for headaches. Negative for dizziness, tremors, seizures,  syncope, facial asymmetry, speech difficulty, weakness, light-headedness and numbness.  Psychiatric/Behavioral: Negative.      Per HPI unless specifically indicated above     Objective:    BP (!) 141/83 (BP Location: Left Arm, Cuff Size: Normal)   Pulse 80   Temp 98.5 F (36.9 C) (Oral)   Wt 123 lb 1.6 oz (55.8 kg)   SpO2 98%   BMI 24.45 kg/m   Wt Readings from Last 3 Encounters:  04/03/22 123 lb 1.6  oz (55.8 kg)  03/29/22 123 lb 6.4 oz (56 kg)  08/29/21 118 lb (53.5 kg)    Physical Exam Vitals and nursing note reviewed.  Constitutional:      General: She is in acute distress.     Appearance: Normal appearance. She is normal weight. She is not ill-appearing, toxic-appearing or diaphoretic.  HENT:     Head: Normocephalic and atraumatic.     Right Ear: External ear normal.     Left Ear: External ear normal.     Nose: Nose normal.     Mouth/Throat:     Mouth: Mucous membranes are moist.     Pharynx: Oropharynx is clear.  Eyes:     General: No scleral icterus.       Right eye: No discharge.        Left eye: No discharge.     Extraocular Movements: Extraocular movements intact.     Conjunctiva/sclera: Conjunctivae normal.     Pupils: Pupils are equal, round, and reactive to light.  Cardiovascular:     Rate and Rhythm: Normal rate and regular rhythm.     Pulses: Normal pulses.     Heart sounds: Normal heart sounds. No murmur heard.    No friction rub. No gallop.  Pulmonary:     Effort: Pulmonary effort is normal. No respiratory distress.     Breath sounds: Normal breath sounds. No stridor. No wheezing, rhonchi or rales.  Chest:     Chest wall: No tenderness.  Musculoskeletal:        General: Normal range of motion.     Cervical back: Normal range of motion and neck supple.  Skin:    General: Skin is warm and dry.     Capillary Refill: Capillary refill takes less than 2 seconds.     Coloration: Skin is not jaundiced or pale.     Findings: No bruising, erythema, lesion or rash.  Neurological:     General: No focal deficit present.     Mental Status: She is alert and oriented to person, place, and time. Mental status is at baseline.  Psychiatric:        Mood and Affect: Mood normal.        Behavior: Behavior normal.        Thought Content: Thought content normal.        Judgment: Judgment normal.     Results for orders placed or performed in visit on 03/29/22  CBC  w/Diff  Result Value Ref Range   WBC 4.6 3.4 - 10.8 x10E3/uL   RBC 3.61 (L) 3.77 - 5.28 x10E6/uL   Hemoglobin 13.0 11.1 - 15.9 g/dL   Hematocrit 37.3 34.0 - 46.6 %   MCV 103 (H) 79 - 97 fL   MCH 36.0 (H) 26.6 - 33.0 pg   MCHC 34.9 31.5 - 35.7 g/dL   RDW 11.8 11.7 - 15.4 %   Platelets 172 150 - 450 x10E3/uL   Neutrophils 64 Not Estab. %   Lymphs  20 Not Estab. %   Monocytes 12 Not Estab. %   Eos 2 Not Estab. %   Basos 1 Not Estab. %   Neutrophils Absolute 2.9 1.4 - 7.0 x10E3/uL   Lymphocytes Absolute 0.9 0.7 - 3.1 x10E3/uL   Monocytes Absolute 0.5 0.1 - 0.9 x10E3/uL   EOS (ABSOLUTE) 0.1 0.0 - 0.4 x10E3/uL   Basophils Absolute 0.0 0.0 - 0.2 x10E3/uL   Immature Granulocytes 1 Not Estab. %   Immature Grans (Abs) 0.1 0.0 - 0.1 x10E3/uL  Comp Met (CMET)  Result Value Ref Range   Glucose 80 70 - 99 mg/dL   BUN 6 (L) 8 - 27 mg/dL   Creatinine, Ser 0.57 0.57 - 1.00 mg/dL   eGFR 95 >59 mL/min/1.73   BUN/Creatinine Ratio 11 (L) 12 - 28   Sodium 143 134 - 144 mmol/L   Potassium 4.4 3.5 - 5.2 mmol/L   Chloride 106 96 - 106 mmol/L   CO2 22 20 - 29 mmol/L   Calcium 9.3 8.7 - 10.3 mg/dL   Total Protein 6.5 6.0 - 8.5 g/dL   Albumin 4.6 3.8 - 4.8 g/dL   Globulin, Total 1.9 1.5 - 4.5 g/dL   Albumin/Globulin Ratio 2.4 (H) 1.2 - 2.2   Bilirubin Total 0.3 0.0 - 1.2 mg/dL   Alkaline Phosphatase 101 44 - 121 IU/L   AST 156 (H) 0 - 40 IU/L   ALT 145 (H) 0 - 32 IU/L  Sed Rate (ESR)  Result Value Ref Range   Sed Rate 4 0 - 40 mm/hr  C-reactive protein  Result Value Ref Range   CRP 4 0 - 10 mg/L      Assessment & Plan:   Problem List Items Addressed This Visit       Cardiovascular and Mediastinum   Hypertension    Will change from metoprolol to propranolol to see if we can help with the headache. Follow up 2-3 weeks. Labs drawn today.       Relevant Medications   propranolol ER (INDERAL LA) 80 MG 24 hr capsule   losartan (COZAAR) 50 MG tablet   Other Relevant Orders    Microalbumin, Urine Waived   CBC with Differential/Platelet   Comprehensive metabolic panel   Urinalysis, Routine w reflex microscopic   TSH   Aortic atherosclerosis (HCC)    Will keep BP and cholesterol under good control. Continue to monitor. Call with any concerns.       Relevant Medications   propranolol ER (INDERAL LA) 80 MG 24 hr capsule   losartan (COZAAR) 50 MG tablet   Other Relevant Orders   CBC with Differential/Platelet   Comprehensive metabolic panel   Lipid Panel w/o Chol/HDL Ratio     Other   Depression    Has been under good control but will change from sertraline to effexor to try to help with headaches. Recheck 2-3 weeks.       Relevant Medications   venlafaxine XR (EFFEXOR XR) 150 MG 24 hr capsule   Other Relevant Orders   CBC with Differential/Platelet   Comprehensive metabolic panel   Chronic intractable headache - Primary    Cannot see her neurologist until April. Will continue her gabapentin and carbamezapine. Will start her on effexor and propranolol. Continue emgality. Prednisone taper started today. ESR was normal, but given temporal/parietal pain if significantly better, will get her into general surgery for TA bx to r/o Temporal artertitis. Recheck 2-3 weeks. Call with any concerns.  Relevant Medications   propranolol ER (INDERAL LA) 80 MG 24 hr capsule   venlafaxine XR (EFFEXOR XR) 150 MG 24 hr capsule   Other Relevant Orders   CBC with Differential/Platelet   Comprehensive metabolic panel   Other Visit Diagnoses     Pyuria       Relevant Orders   Urine Culture        Follow up plan: Return for 2-3 weeks.

## 2022-04-03 NOTE — Assessment & Plan Note (Signed)
Will keep BP and cholesterol under good control. Continue to monitor. Call with any concerns.

## 2022-04-03 NOTE — Assessment & Plan Note (Signed)
Has been under good control but will change from sertraline to effexor to try to help with headaches. Recheck 2-3 weeks.

## 2022-04-04 LAB — COMPREHENSIVE METABOLIC PANEL
ALT: 93 IU/L — ABNORMAL HIGH (ref 0–32)
AST: 89 IU/L — ABNORMAL HIGH (ref 0–40)
Albumin/Globulin Ratio: 2.7 — ABNORMAL HIGH (ref 1.2–2.2)
Albumin: 4.8 g/dL (ref 3.8–4.8)
Alkaline Phosphatase: 99 IU/L (ref 44–121)
BUN/Creatinine Ratio: 10 — ABNORMAL LOW (ref 12–28)
BUN: 6 mg/dL — ABNORMAL LOW (ref 8–27)
Bilirubin Total: 0.6 mg/dL (ref 0.0–1.2)
CO2: 23 mmol/L (ref 20–29)
Calcium: 9.7 mg/dL (ref 8.7–10.3)
Chloride: 95 mmol/L — ABNORMAL LOW (ref 96–106)
Creatinine, Ser: 0.6 mg/dL (ref 0.57–1.00)
Globulin, Total: 1.8 g/dL (ref 1.5–4.5)
Glucose: 87 mg/dL (ref 70–99)
Potassium: 4.1 mmol/L (ref 3.5–5.2)
Sodium: 134 mmol/L (ref 134–144)
Total Protein: 6.6 g/dL (ref 6.0–8.5)
eGFR: 94 mL/min/{1.73_m2} (ref >59)

## 2022-04-04 LAB — TSH: TSH: 0.639 u[IU]/mL (ref 0.450–4.500)

## 2022-04-04 LAB — CBC WITH DIFFERENTIAL/PLATELET
Basophils Absolute: 0 10*3/uL (ref 0.0–0.2)
Basos: 0 %
EOS (ABSOLUTE): 0.1 10*3/uL (ref 0.0–0.4)
Eos: 1 %
Hematocrit: 38 % (ref 34.0–46.6)
Hemoglobin: 13.1 g/dL (ref 11.1–15.9)
Immature Grans (Abs): 0 10*3/uL (ref 0.0–0.1)
Immature Granulocytes: 0 %
Lymphocytes Absolute: 0.7 10*3/uL (ref 0.7–3.1)
Lymphs: 14 %
MCH: 35.7 pg — ABNORMAL HIGH (ref 26.6–33.0)
MCHC: 34.5 g/dL (ref 31.5–35.7)
MCV: 104 fL — ABNORMAL HIGH (ref 79–97)
Monocytes Absolute: 0.5 10*3/uL (ref 0.1–0.9)
Monocytes: 10 %
Neutrophils Absolute: 3.8 10*3/uL (ref 1.4–7.0)
Neutrophils: 75 %
Platelets: 191 10*3/uL (ref 150–450)
RBC: 3.67 x10E6/uL — ABNORMAL LOW (ref 3.77–5.28)
RDW: 11.4 % — ABNORMAL LOW (ref 11.7–15.4)
WBC: 5.2 10*3/uL (ref 3.4–10.8)

## 2022-04-04 LAB — LIPID PANEL W/O CHOL/HDL RATIO
Cholesterol, Total: 207 mg/dL — ABNORMAL HIGH (ref 100–199)
HDL: 94 mg/dL (ref >39)
LDL Chol Calc (NIH): 99 mg/dL (ref 0–99)
Triglycerides: 78 mg/dL (ref 0–149)
VLDL Cholesterol Cal: 14 mg/dL (ref 5–40)

## 2022-04-05 ENCOUNTER — Telehealth: Payer: Self-pay | Admitting: Family Medicine

## 2022-04-05 ENCOUNTER — Encounter: Payer: Self-pay | Admitting: Family Medicine

## 2022-04-05 LAB — URINE CULTURE

## 2022-04-05 MED ORDER — LOSARTAN POTASSIUM 100 MG PO TABS: 100.0000 mg | ORAL_TABLET | Freq: Every day | ORAL | 1 refills | Status: DC

## 2022-04-05 NOTE — Telephone Encounter (Signed)
Spoke with patient and she says she is feeling a little better, but says "it is still there." Patient says it was once at an 11 pain scale and now it is at a 9 pain level. Patient verbalized understanding.

## 2022-04-05 NOTE — Telephone Encounter (Addendum)
I'm resending in the '100mg'$  dose- continue '100mg'$  dose.   Please find out how her head is feeling on the prednisone

## 2022-04-05 NOTE — Telephone Encounter (Signed)
Pt Noticed her losartan (COZAAR)  changed from 100 MG to 50 MG tablet/ pt wanted to make sure before she takes it if this was an error or if Dr. Wynetta Emery meant to lower the dose/ please advise

## 2022-04-05 NOTE — Addendum Note (Signed)
Addended by: Valerie Roys on: 04/05/2022 02:38 PM   Modules accepted: Orders

## 2022-04-05 NOTE — Telephone Encounter (Signed)
I had '50mg'$  on her chart- please confirm what dose she was on. I want to keep her on the same dose.

## 2022-04-05 NOTE — Telephone Encounter (Signed)
Spoke with patient and she says she has been taking 100 MG of Losartan for the past couple of months. Please advise?

## 2022-04-05 NOTE — Telephone Encounter (Signed)
OK keep current plan. Thanks!

## 2022-04-17 ENCOUNTER — Other Ambulatory Visit: Payer: Self-pay | Admitting: Family Medicine

## 2022-04-17 MED ORDER — GABAPENTIN 300 MG PO CAPS: 900.0000 mg | ORAL_CAPSULE | Freq: Three times a day (TID) | ORAL | 3 refills | Status: DC

## 2022-04-17 NOTE — Telephone Encounter (Signed)
Requested medication (s) are due for refill today: Yes  Requested medication (s) are on the active medication list: Yes  Last refill:  03/29/22  Future visit scheduled: Yes  Notes to clinic:  Unable to refill per protocol, last refill by another provider. Pt states that she has tried to get in touch with her Neurologist for the refill and they have not gotten back in touch. Pt is wanting to see if her PCP can refill her medication.   Pt has an appt on 04/23/22 with her PCP.    Pt states that she is out of her medication.     Requested Prescriptions  Pending Prescriptions Disp Refills   gabapentin (NEURONTIN) 300 MG capsule      Sig: Take 3 capsules (900 mg total) by mouth 3 (three) times daily.     Neurology: Anticonvulsants - gabapentin Passed - 04/17/2022 10:47 AM      Passed - Cr in normal range and within 360 days    Creatinine  Date Value Ref Range Status  02/22/2019 116.9 20.0 - 300.0 mg/dL Final   Creatinine, Ser  Date Value Ref Range Status  04/03/2022 0.60 0.57 - 1.00 mg/dL Final         Passed - Completed PHQ-2 or PHQ-9 in the last 360 days      Passed - Valid encounter within last 12 months    Recent Outpatient Visits           2 weeks ago Chronic intractable headache, unspecified headache type   [place], Megan P, DO   2 weeks ago Chronic intractable headache, unspecified headache type   [place] [facility] Mecum, Dani Gobble, PA-C   7 months ago Primary hypertension   [place], [place], DO   10 months ago Primary hypertension   [place], [place], DO   1 year ago Encounter for annual wellness exam in Medicare patient   [place], Barb Merino, DO       Future Appointments             In 6 days Valerie Roys, DO [place], [facility]

## 2022-04-17 NOTE — Telephone Encounter (Signed)
Medication Refill - Medication: gabapentin (NEURONTIN) 300 MG capsule   Has the patient contacted their pharmacy? No.  Pt states that she has tried to get in touch with her Neurologist for the refill and they have not gotten back in touch. Pt is wanting to see if her PCP can refill her medication.   Pt has an appt on 04/23/22 with her PCP.   Pt states that she is out of her medication.  Preferred Pharmacy (with phone number or street name):  [id number] - [place], [address] Phone: [phone]  Fax: [phone]     Has the patient been seen for an appointment in the last year OR does the patient have an upcoming appointment? Yes.    Agent: Please be advised that RX refills may take up to 3 business days. We ask that you follow-up with your pharmacy.

## 2022-04-21 ENCOUNTER — Other Ambulatory Visit: Payer: Self-pay | Admitting: Physician Assistant

## 2022-04-21 ENCOUNTER — Other Ambulatory Visit: Payer: Self-pay | Admitting: Family Medicine

## 2022-04-21 DIAGNOSIS — G500 Trigeminal neuralgia: Secondary | ICD-10-CM

## 2022-04-21 DIAGNOSIS — G8929 Other chronic pain: Secondary | ICD-10-CM

## 2022-04-23 ENCOUNTER — Ambulatory Visit (INDEPENDENT_AMBULATORY_CARE_PROVIDER_SITE_OTHER): Payer: Medicare Other | Admitting: Family Medicine

## 2022-04-23 ENCOUNTER — Other Ambulatory Visit: Payer: Self-pay | Admitting: Family Medicine

## 2022-04-23 ENCOUNTER — Encounter: Payer: Self-pay | Admitting: Family Medicine

## 2022-04-23 VITALS — BP 145/81 | HR 69 | Temp 98.5°F | Wt 125.1 lb

## 2022-04-23 DIAGNOSIS — G8929 Other chronic pain: Secondary | ICD-10-CM

## 2022-04-23 DIAGNOSIS — R519 Headache, unspecified: Secondary | ICD-10-CM

## 2022-04-23 MED ORDER — HYDROCODONE-ACETAMINOPHEN 10-325 MG PO TABS: 1.0000 | ORAL_TABLET | Freq: Three times a day (TID) | ORAL | 0 refills | Status: AC | PRN

## 2022-04-23 MED ORDER — KETOROLAC TROMETHAMINE 60 MG/2ML IM SOLN
60.0000 mg | Freq: Once | INTRAMUSCULAR | Status: AC
  Administered 2022-04-23: 60 mg via INTRAMUSCULAR

## 2022-04-23 NOTE — Progress Notes (Signed)
BP (!) 145/81 (BP Location: Left Arm, Cuff Size: Normal)   Pulse 69   Temp 98.5 F (36.9 C) (Oral)   Wt 125 lb 1.6 oz (56.7 kg)   SpO2 97%   BMI 24.84 kg/m    Subjective:    Patient ID: Casey Summers, female    DOB: 1945/07/18, 77 y.o.   MRN: [id number]  HPI: Casey Summers is a 77 y.o. female  Chief Complaint  Patient presents with   Headache    Patient says her head is killing her today on the L side and says the medication isn't helping her. Patient says she has taken 3 tablets of 300 MG Gabapentin.    Has not had any pain free days since she was here last time. Nothing seems to be helping. She notes that her pain is in her L temple and L side of her head. She notes that it's sharp and stabbing. Nothing makes it better. She is unsure what makes it worse. She has not noticed any difference since starting her effexor or her propranolol. She follows with neurology, but they cannot see her until 4/12. She has called multiple times and they cannot see her any sooner. No other concerns or complaints at this time.   Relevant past medical, surgical, family and social history reviewed and updated as indicated. Interim medical history since our last visit reviewed. Allergies and medications reviewed and updated.  Review of Systems  Constitutional: Negative.   Respiratory: Negative.    Cardiovascular: Negative.   Gastrointestinal: Negative.   Genitourinary: Negative.   Musculoskeletal: Negative.   Neurological:  Positive for headaches. Negative for dizziness, tremors, seizures, syncope, facial asymmetry, speech difficulty, weakness, light-headedness and numbness.  Psychiatric/Behavioral: Negative.      Per HPI unless specifically indicated above     Objective:    BP (!) 145/81 (BP Location: Left Arm, Cuff Size: Normal)   Pulse 69   Temp 98.5 F (36.9 C) (Oral)   Wt 125 lb 1.6 oz (56.7 kg)   SpO2 97%   BMI 24.84 kg/m   Wt Readings from Last 3 Encounters:  04/23/22  125 lb 1.6 oz (56.7 kg)  04/03/22 123 lb 1.6 oz (55.8 kg)  03/29/22 123 lb 6.4 oz (56 kg)    Physical Exam Vitals and nursing note reviewed.  Constitutional:      General: She is in acute distress (writhing in pain, holding her head).     Appearance: Normal appearance. She is well-developed and normal weight. She is not ill-appearing, toxic-appearing or diaphoretic.  HENT:     Head: Normocephalic and atraumatic.     Right Ear: External ear normal.     Left Ear: External ear normal.     Nose: Nose normal.     Mouth/Throat:     Mouth: Mucous membranes are moist.     Pharynx: Oropharynx is clear.  Eyes:     General: No scleral icterus.       Right eye: No discharge.        Left eye: No discharge.     Extraocular Movements: Extraocular movements intact.     Conjunctiva/sclera: Conjunctivae normal.     Pupils: Pupils are equal, round, and reactive to light.  Cardiovascular:     Rate and Rhythm: Normal rate and regular rhythm.     Pulses: Normal pulses.     Heart sounds: Normal heart sounds. No murmur heard.    No friction rub. No gallop.  Pulmonary:  Effort: Pulmonary effort is normal. No respiratory distress.     Breath sounds: Normal breath sounds. No stridor. No wheezing, rhonchi or rales.  Chest:     Chest wall: No tenderness.  Musculoskeletal:        General: Normal range of motion.     Cervical back: Normal range of motion and neck supple.  Skin:    General: Skin is warm and dry.     Capillary Refill: Capillary refill takes less than 2 seconds.     Coloration: Skin is not jaundiced or pale.     Findings: No bruising, erythema, lesion or rash.  Neurological:     General: No focal deficit present.     Mental Status: She is alert and oriented to person, place, and time. Mental status is at baseline.     Comments: Very tender to light touch of L temple  Psychiatric:        Mood and Affect: Mood normal.        Behavior: Behavior normal.        Thought Content: Thought  content normal.        Judgment: Judgment normal.     Results for orders placed or performed in visit on 04/03/22  Urine Culture   Specimen: Urine   UR  Result Value Ref Range   Urine Culture, Routine Final report    Organism ID, Bacteria Comment   Microscopic Examination   Urine  Result Value Ref Range   WBC, UA 6-10 (A) 0 - 5 /hpf   RBC, Urine 0-2 0 - 2 /hpf   Epithelial Cells (non renal) 0-10 0 - 10 /hpf   Bacteria, UA None seen None seen/Few  Microalbumin, Urine Waived  Result Value Ref Range   Microalb, Ur Waived 30 (H) 0 - 19 mg/L   Creatinine, Urine Waived 50 10 - 300 mg/dL   Microalb/Creat Ratio 30-300 (H) <30 mg/g  CBC with Differential/Platelet  Result Value Ref Range   WBC 5.2 3.4 - 10.8 x10E3/uL   RBC 3.67 (L) 3.77 - 5.28 x10E6/uL   Hemoglobin 13.1 11.1 - 15.9 g/dL   Hematocrit 38.0 34.0 - 46.6 %   MCV 104 (H) 79 - 97 fL   MCH 35.7 (H) 26.6 - 33.0 pg   MCHC 34.5 31.5 - 35.7 g/dL   RDW 11.4 (L) 11.7 - 15.4 %   Platelets 191 150 - 450 x10E3/uL   Neutrophils 75 Not Estab. %   Lymphs 14 Not Estab. %   Monocytes 10 Not Estab. %   Eos 1 Not Estab. %   Basos 0 Not Estab. %   Neutrophils Absolute 3.8 1.4 - 7.0 x10E3/uL   Lymphocytes Absolute 0.7 0.7 - 3.1 x10E3/uL   Monocytes Absolute 0.5 0.1 - 0.9 x10E3/uL   EOS (ABSOLUTE) 0.1 0.0 - 0.4 x10E3/uL   Basophils Absolute 0.0 0.0 - 0.2 x10E3/uL   Immature Granulocytes 0 Not Estab. %   Immature Grans (Abs) 0.0 0.0 - 0.1 x10E3/uL  Comprehensive metabolic panel  Result Value Ref Range   Glucose 87 70 - 99 mg/dL   BUN 6 (L) 8 - 27 mg/dL   Creatinine, Ser 0.60 0.57 - 1.00 mg/dL   eGFR 94 >59 mL/min/1.73   BUN/Creatinine Ratio 10 (L) 12 - 28   Sodium 134 134 - 144 mmol/L   Potassium 4.1 3.5 - 5.2 mmol/L   Chloride 95 (L) 96 - 106 mmol/L   CO2 23 20 - 29 mmol/L   Calcium 9.7  8.7 - 10.3 mg/dL   Total Protein 6.6 6.0 - 8.5 g/dL   Albumin 4.8 3.8 - 4.8 g/dL   Globulin, Total 1.8 1.5 - 4.5 g/dL   Albumin/Globulin  Ratio 2.7 (H) 1.2 - 2.2   Bilirubin Total 0.6 0.0 - 1.2 mg/dL   Alkaline Phosphatase 99 44 - 121 IU/L   AST 89 (H) 0 - 40 IU/L   ALT 93 (H) 0 - 32 IU/L  Lipid Panel w/o Chol/HDL Ratio  Result Value Ref Range   Cholesterol, Total 207 (H) 100 - 199 mg/dL   Triglycerides 78 0 - 149 mg/dL   HDL 94 >39 mg/dL   VLDL Cholesterol Cal 14 5 - 40 mg/dL   LDL Chol Calc (NIH) 99 0 - 99 mg/dL  Urinalysis, Routine w reflex microscopic  Result Value Ref Range   Specific Gravity, UA 1.015 1.005 - 1.030   pH, UA 7.0 5.0 - 7.5   Color, UA Yellow Yellow   Appearance Ur Cloudy (A) Clear   Leukocytes,UA 3+ (A) Negative   Protein,UA Negative Negative/Trace   Glucose, UA Negative Negative   Ketones, UA 1+ (A) Negative   RBC, UA Negative Negative   Bilirubin, UA CANCELED    Urobilinogen, Ur 0.2 0.2 - 1.0 mg/dL   Nitrite, UA Negative Negative   Microscopic Examination See below:   TSH  Result Value Ref Range   TSH 0.639 0.450 - 4.500 uIU/mL      Assessment & Plan:   Problem List Items Addressed This Visit       Other   Chronic intractable headache - Primary    No better with gabapentin, carbemezapine, propranolol, emgality or effexor. Not significantly better with prednisone. 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 Due to see neurology on 4/12. Keep appointment. Will give short supply of pain medicine to help with severe pain. Await appointments. Call with any concerns.       Other Visit Diagnoses     Temporal pain       ESR was normal last check, and no significant benefit from prednisone, but significant concern for TA. Will get her into general surgery ASAP for TA bx.   Relevant Medications   ketorolac (TORADOL) injection 60 mg (Completed)        Follow up plan: Return in about 4 weeks (around 05/21/2022).

## 2022-04-23 NOTE — Telephone Encounter (Signed)
Requested Prescriptions  Pending Prescriptions Disp Refills   carbamazepine (TEGRETOL XR) 100 MG 12 hr tablet [Pharmacy Med Name: CARBAMAZEPINE ER 100 MG TABLET] 90 tablet     Sig: TAKE 1 TABLET BY MOUTH EVERY DAY     Neurology:  Anticonvulsants - carbamazepine Failed - 04/21/2022  8:09 PM      Failed - AST in normal range and within 360 days    AST  Date Value Ref Range Status  04/03/2022 89 (H) 0 - 40 IU/L Final         Failed - ALT in normal range and within 360 days    ALT  Date Value Ref Range Status  04/03/2022 93 (H) 0 - 32 IU/L Final         Failed - Carbamazepine (serum) in normal range and within 360 days    No results found for: "CBMZ", "LABCARB"       Passed - WBC in normal range and within 360 days    WBC  Date Value Ref Range Status  04/03/2022 5.2 3.4 - 10.8 x10E3/uL Final  10/12/2019 6.0 4.0 - 10.5 K/uL Final         Passed - PLT in normal range and within 360 days    Platelets  Date Value Ref Range Status  04/03/2022 191 150 - 450 x10E3/uL Final         Passed - HGB in normal range and within 360 days    Hemoglobin  Date Value Ref Range Status  04/03/2022 13.1 11.1 - 15.9 g/dL Final         Passed - Na in normal range and within 360 days    Sodium  Date Value Ref Range Status  04/03/2022 134 134 - 144 mmol/L Final         Passed - HCT in normal range and within 360 days    Hematocrit  Date Value Ref Range Status  04/03/2022 38.0 34.0 - 46.6 % Final         Passed - Cr in normal range and within 360 days    Creatinine  Date Value Ref Range Status  02/22/2019 116.9 20.0 - 300.0 mg/dL Final   Creatinine, Ser  Date Value Ref Range Status  04/03/2022 0.60 0.57 - 1.00 mg/dL Final         Passed - Completed PHQ-2 or PHQ-9 in the last 360 days      Passed - Valid encounter within last 12 months    Recent Outpatient Visits           Today Chronic intractable headache, unspecified headache type   [place],  Megan P, DO   2 weeks ago Chronic intractable headache, unspecified headache type   [place], Megan P, DO   3 weeks ago Chronic intractable headache, unspecified headache type   [place] [facility] Mecum, Dani Gobble, PA-C   7 months ago Primary hypertension   [place], [place], DO   10 months ago Primary hypertension   [place], [place], DO       Future Appointments             In 4 weeks Wynetta Emery, Barb Merino, DO [place], [facility]

## 2022-04-23 NOTE — Assessment & Plan Note (Signed)
No better with gabapentin, carbemezapine, propranolol, emgality or effexor. Not significantly better with prednisone. 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 Due to see neurology on 4/12. Keep appointment. Will give short supply of pain medicine to help with severe pain. Await appointments. Call with any concerns.

## 2022-04-23 NOTE — Telephone Encounter (Signed)
Unable to refill per protocol, Rx request is too soon. Last refill 04/03/22 for 30 days and 3 refills.  Requested Prescriptions  Pending Prescriptions Disp Refills   propranolol ER (INDERAL LA) 80 MG 24 hr capsule [Pharmacy Med Name: PROPRANOLOL ER 80 MG CAPSULE] 90 capsule 1    Sig: TAKE 1 CAPSULE BY MOUTH EVERY DAY     Cardiovascular:  Beta Blockers Failed - 04/21/2022  8:09 PM      Failed - Last BP in normal range    BP Readings from Last 1 Encounters:  04/03/22 (!) 141/83         Passed - Last Heart Rate in normal range    Pulse Readings from Last 1 Encounters:  04/03/22 80         Passed - Valid encounter within last 6 months    Recent Outpatient Visits           2 weeks ago Chronic intractable headache, unspecified headache type   [place], Megan P, DO   3 weeks ago Chronic intractable headache, unspecified headache type   [place] [facility] Mecum, Dani Gobble, PA-C   7 months ago Primary hypertension   [place], [place], DO   10 months ago Primary hypertension   [place], [place], DO   1 year ago Encounter for annual wellness exam in Medicare patient   [place], Barb Merino, DO       Future Appointments             Today Valerie Roys, DO [place], [facility]

## 2022-04-24 NOTE — Telephone Encounter (Signed)
Requested medication (s) are due for refill today: last refill #30 on 04/03/22  Requested medication (s) are on the active medication list: yes   Last refill:  04/03/22 #30 3 refills  Future visit scheduled: yes in 3 weeks  Notes to clinic:  pharmacy requesting 90 day supply. Last dispensed 04/04/22 #30.  Do you want to allow for #90 0 refills at this time?      Requested Prescriptions  Pending Prescriptions Disp Refills   propranolol ER (INDERAL LA) 80 MG 24 hr capsule [Pharmacy Med Name: PROPRANOLOL ER 80 MG CAPSULE] 90 capsule 1    Sig: TAKE 1 CAPSULE BY MOUTH EVERY DAY     Cardiovascular:  Beta Blockers Failed - 04/23/2022  8:34 AM      Failed - Last BP in normal range    BP Readings from Last 1 Encounters:  04/23/22 (!) 145/81         Passed - Last Heart Rate in normal range    Pulse Readings from Last 1 Encounters:  04/23/22 69         Passed - Valid encounter within last 6 months    Recent Outpatient Visits           Yesterday Chronic intractable headache, unspecified headache type   [place], Megan P, DO   3 weeks ago Chronic intractable headache, unspecified headache type   [place], Megan P, DO   3 weeks ago Chronic intractable headache, unspecified headache type   [place], Dani Gobble, PA-C   7 months ago Primary hypertension   [place], [place], DO   11 months ago Primary hypertension   [place], [place], DO       Future Appointments             In 3 weeks Wynetta Emery, Barb Merino, DO [place], [facility]

## 2022-04-24 NOTE — Telephone Encounter (Signed)
Called pharmacy to confirm medication refill request. Pharmacy staff Mitzi Hansen reports patient picked up refill on 04/03/22 for #30 . No refills remain.

## 2022-04-26 ENCOUNTER — Other Ambulatory Visit: Payer: Self-pay | Admitting: Family Medicine

## 2022-04-26 NOTE — Telephone Encounter (Signed)
Requested Prescriptions  Pending Prescriptions Disp Refills   venlafaxine XR (EFFEXOR-XR) 150 MG 24 hr capsule [Pharmacy Med Name: VENLAFAXINE HCL ER 150 MG CAP] 90 capsule 2    Sig: TAKE 1 CAPSULE BY MOUTH DAILY WITH BREAKFAST.     Psychiatry: Antidepressants - SNRI - desvenlafaxine & venlafaxine Failed - 04/26/2022 11:32 AM      Failed - Last BP in normal range    BP Readings from Last 1 Encounters:  04/23/22 (!) 145/81         Failed - Lipid Panel in normal range within the last 12 months    Cholesterol, Total  Date Value Ref Range Status  04/03/2022 207 (H) 100 - 199 mg/dL Final   LDL Chol Calc (NIH)  Date Value Ref Range Status  04/03/2022 99 0 - 99 mg/dL Final   HDL  Date Value Ref Range Status  04/03/2022 94 >39 mg/dL Final   Triglycerides  Date Value Ref Range Status  04/03/2022 78 0 - 149 mg/dL Final         Passed - Cr in normal range and within 360 days    Creatinine  Date Value Ref Range Status  02/22/2019 116.9 20.0 - 300.0 mg/dL Final   Creatinine, Ser  Date Value Ref Range Status  04/03/2022 0.60 0.57 - 1.00 mg/dL Final         Passed - Completed PHQ-2 or PHQ-9 in the last 360 days      Passed - Valid encounter within last 6 months    Recent Outpatient Visits           3 days ago Chronic intractable headache, unspecified headache type   [place], Megan P, DO   3 weeks ago Chronic intractable headache, unspecified headache type   [place], Megan P, DO   4 weeks ago Chronic intractable headache, unspecified headache type   [facility] Mecum, Dani Gobble, PA-C   8 months ago Primary hypertension   [place], [place], DO   11 months ago Primary hypertension   [place], [place], DO       Future Appointments             In 3 weeks Wynetta Emery, Barb Merino, DO [place], [facility]

## 2022-04-27 DIAGNOSIS — R8281 Pyuria: Secondary | ICD-10-CM | POA: Diagnosis not present

## 2022-04-27 DIAGNOSIS — R0789 Other chest pain: Secondary | ICD-10-CM | POA: Diagnosis not present

## 2022-04-27 DIAGNOSIS — J811 Chronic pulmonary edema: Secondary | ICD-10-CM | POA: Diagnosis not present

## 2022-04-27 DIAGNOSIS — R9431 Abnormal electrocardiogram [ECG] [EKG]: Secondary | ICD-10-CM | POA: Diagnosis not present

## 2022-04-27 DIAGNOSIS — R0602 Shortness of breath: Secondary | ICD-10-CM | POA: Diagnosis not present

## 2022-04-27 DIAGNOSIS — R7401 Elevation of levels of liver transaminase levels: Secondary | ICD-10-CM | POA: Diagnosis not present

## 2022-04-27 DIAGNOSIS — F32A Depression, unspecified: Secondary | ICD-10-CM | POA: Diagnosis not present

## 2022-04-27 DIAGNOSIS — D1803 Hemangioma of intra-abdominal structures: Secondary | ICD-10-CM | POA: Diagnosis not present

## 2022-04-27 DIAGNOSIS — I10 Essential (primary) hypertension: Secondary | ICD-10-CM | POA: Diagnosis not present

## 2022-04-27 DIAGNOSIS — R519 Headache, unspecified: Secondary | ICD-10-CM | POA: Diagnosis not present

## 2022-04-27 DIAGNOSIS — N8110 Cystocele, unspecified: Secondary | ICD-10-CM | POA: Diagnosis not present

## 2022-04-27 DIAGNOSIS — Z20822 Contact with and (suspected) exposure to covid-19: Secondary | ICD-10-CM | POA: Diagnosis not present

## 2022-04-28 DIAGNOSIS — R7989 Other specified abnormal findings of blood chemistry: Secondary | ICD-10-CM | POA: Diagnosis not present

## 2022-04-28 DIAGNOSIS — R519 Headache, unspecified: Secondary | ICD-10-CM | POA: Diagnosis not present

## 2022-04-28 DIAGNOSIS — I509 Heart failure, unspecified: Secondary | ICD-10-CM | POA: Diagnosis not present

## 2022-04-28 DIAGNOSIS — K219 Gastro-esophageal reflux disease without esophagitis: Secondary | ICD-10-CM | POA: Diagnosis not present

## 2022-04-28 DIAGNOSIS — R0789 Other chest pain: Secondary | ICD-10-CM | POA: Diagnosis not present

## 2022-04-28 DIAGNOSIS — R0602 Shortness of breath: Secondary | ICD-10-CM | POA: Diagnosis not present

## 2022-04-28 DIAGNOSIS — I10 Essential (primary) hypertension: Secondary | ICD-10-CM | POA: Diagnosis not present

## 2022-04-29 ENCOUNTER — Ambulatory Visit: Payer: Medicare Other | Admitting: Surgery

## 2022-04-29 DIAGNOSIS — I5031 Acute diastolic (congestive) heart failure: Secondary | ICD-10-CM | POA: Diagnosis not present

## 2022-04-29 DIAGNOSIS — R519 Headache, unspecified: Secondary | ICD-10-CM | POA: Diagnosis not present

## 2022-04-29 DIAGNOSIS — I5030 Unspecified diastolic (congestive) heart failure: Secondary | ICD-10-CM | POA: Diagnosis not present

## 2022-04-29 DIAGNOSIS — R7989 Other specified abnormal findings of blood chemistry: Secondary | ICD-10-CM | POA: Diagnosis not present

## 2022-04-29 DIAGNOSIS — I509 Heart failure, unspecified: Secondary | ICD-10-CM | POA: Diagnosis not present

## 2022-04-29 DIAGNOSIS — R7401 Elevation of levels of liver transaminase levels: Secondary | ICD-10-CM | POA: Diagnosis not present

## 2022-04-29 DIAGNOSIS — I110 Hypertensive heart disease with heart failure: Secondary | ICD-10-CM | POA: Diagnosis not present

## 2022-04-30 ENCOUNTER — Encounter: Payer: Self-pay | Admitting: Family Medicine

## 2022-04-30 ENCOUNTER — Ambulatory Visit (INDEPENDENT_AMBULATORY_CARE_PROVIDER_SITE_OTHER): Payer: Medicare Other | Admitting: Family Medicine

## 2022-04-30 VITALS — BP 131/82 | HR 70 | Temp 97.7°F | Wt 124.4 lb

## 2022-04-30 DIAGNOSIS — I509 Heart failure, unspecified: Secondary | ICD-10-CM | POA: Diagnosis not present

## 2022-04-30 DIAGNOSIS — D1803 Hemangioma of intra-abdominal structures: Secondary | ICD-10-CM

## 2022-04-30 DIAGNOSIS — R519 Headache, unspecified: Secondary | ICD-10-CM

## 2022-04-30 DIAGNOSIS — G8929 Other chronic pain: Secondary | ICD-10-CM | POA: Diagnosis not present

## 2022-04-30 MED ORDER — EMGALITY 120 MG/ML ~~LOC~~ SOAJ: 240.0000 mg | Freq: Once | SUBCUTANEOUS | 0 refills | Status: DC

## 2022-04-30 MED ORDER — EMPAGLIFLOZIN 10 MG PO TABS: 10.0000 mg | ORAL_TABLET | Freq: Every day | ORAL | 3 refills | Status: DC

## 2022-04-30 MED ORDER — FUROSEMIDE 20 MG PO TABS: ORAL_TABLET | ORAL | 1 refills | Status: DC

## 2022-04-30 MED ORDER — LOSARTAN POTASSIUM 50 MG PO TABS: 50.0000 mg | ORAL_TABLET | Freq: Every day | ORAL | 3 refills | Status: DC

## 2022-04-30 NOTE — Assessment & Plan Note (Signed)
Here today with her daughter. Has not been on her emgality for unclear amount of time. Will restart it. Due to see neurology in about 10 days. Follow up with them as scheduled. Call with any concerns.

## 2022-04-30 NOTE — Progress Notes (Unsigned)
BP 131/82   Pulse 70   Temp 97.7 F (36.5 C) (Oral)   Wt 124 lb 6.4 oz (56.4 kg)   SpO2 98%   BMI 24.71 kg/m    Subjective:    Patient ID: Casey Summers, female    DOB: 02/23/1945, 77 y.o.   MRN: [id number]  HPI: Casey Summers is a 77 y.o. female  Chief Complaint  Patient presents with   Congestive Heart Failure    Patient daughter says she spoke with the providers and was told patient has "mild heart failure" and she is scheduled with Cardiologist on 24th of April and was prescribed a fluid pill as well.    Transition of Care Hospital Follow up.   Hospital/Facility: [facility] D/C Physician: Carlyn Reichert, MD  D/C Date: 04/29/22  Records Requested: 04/30/22  Records Received: 04/30/22 Records Reviewed: 04/30/22  Diagnoses on Discharge: Heart failure with preserved ejection fraction, essential hypertension, chronic headache, elevated transaminases, liver lesion   Date of interactive Contact within 48 hours of discharge: Today Contact was through: direct  Date of 7 day or 14 day face-to-face visit: 04/30/22  within 7 days  Outpatient Encounter Medications as of 04/30/2022  Medication Sig   alendronate (FOSAMAX) 70 MG tablet TAKE 1 TABLET EVERY 7 DAYS. TAKE WITH A FULL GLASS OF WATER ON AN EMPTY STOMACH.   aspirin 81 MG tablet Take 81 mg by mouth.    carbamazepine (TEGRETOL-XR) 100 MG 12 hr tablet Take 1 tablet (100 mg total) by mouth daily.   Cholecalciferol 25 MCG (1000 UT) tablet Take by mouth.   EMGALITY, 300 MG DOSE, 100 MG/ML SOSY Inject into the skin.   gabapentin (NEURONTIN) 300 MG capsule Take 3 capsules (900 mg total) by mouth 3 (three) times daily.   losartan (COZAAR) 100 MG tablet Take 1 tablet (100 mg total) by mouth daily.   omeprazole (PRILOSEC) 20 MG capsule Take 1 capsule (20 mg total) by mouth daily.   propranolol ER (INDERAL LA) 80 MG 24 hr capsule Take 1 capsule (80 mg total) by mouth daily.   venlafaxine XR (EFFEXOR XR) 150 MG 24 hr  capsule Take 1 capsule (150 mg total) by mouth daily with breakfast.   No facility-administered encounter medications on file as of 04/30/2022.  Per Hospitalist: "Hospital Course: The patient is a 77 year old Caucasian female with a history of essential hypertension, nonocclusive coronary artery disease, chronic headache actively going workup who presented to the [facility] ER complaining of chest pressure, worsening shortness of breath with orthopnea. She was hemodynamically stable. She had inspiratory crackles and 1+ pitting leg edema. Her chest x-ray was consistent with pulmonary edema and her B-type natriuretic peptide was elevated at 369 pg/mL (upper limit of normal 100) her clinical picture was of new onset congestive heart failure. She described her central chest discomfort as a mild pressure, she had no associated nausea, vomiting or diaphoresis.  Her electrocardiogram showed no acute ischemic changes but there were abnormalities in the inferior leads with nonspecific T wave abnormalities replacing inverted T waves from prior EKG. She subsequently had elevations in high-sensitivity troponin levels initially normal at 25, (upper limit of normal 34) trending up to 91 and then trending downward. She was admitted to the hospital for intravenous diuresis with furosemide and echocardiogram was ordered. Transthoracic echocardiogram showed preserved left ventricular systolic function greater than 55%. Left ventricle was normal in size with wall thickness at the upper limit of normal. Right ventricle was normal in  size and function. There were no significant valvular lesions. She was effectively diuresed with resolution of bibasilar crackles and resolution of lower extremity edema.  She was seen in Cardiology consultation the morning after admission. She had been diuresed and felt much better. She did not have ongoing chest pain/pressure. It was recommended that the patient start Jardiance, decrease her  losartan by 50%, start weight-based regimen of furosemide. She takes propranolol for her headache and this may be continued as blood pressure allows. Aldactone may be reviewed in the outpatient setting as well as a consideration for stress test when she is seen in outpatient Cardiology clinic follow-up.  The patient reports drinking 2 glasses of wine daily, but given elevation in liver enzymes and elevated MCV to 104, her intake may be greater than this. The patient is counseled that given her size and body mass index, no more than one 5 ounce glass of wine a day is recommended. We discussed that alcohol in sufficient amounts can be toxic to heart muscle. 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 It is not clear that this lesion is causing slight elevations in transaminases, and the transaminases are not in the typical ratio seen when secondary to alcoholic liver injury. She showed no signs of alcohol withdrawal at all during her hospitalization. Liver lesion follow-up and discussion of alcohol intake may be reviewed in more detail in the Primary Care setting.  She is asked to see her Primary Care physician within a couple weeks in general follow-up as well has Cardiology follow-up. She reports having had scheduled a diagnostic test at [facility] today, the day of discharge as part of her headache workup, and is asked to reschedule that as soon as possible so that she may follow-up with [facility] as previously planned this coming April 12."  Diagnostic Tests Reviewed:  Impression  -No acute abnormality in the abdomen or pelvis explain patient's symptoms.  -Heterogeneously enhancing lesion in hepatic segment VIII measuring 3.0 cm, indeterminate. This was previously characterized as an  atypical hemangioma on outside MRI from 2019, although follow-up MRI was recommended at that time given atypical features. Recommend continued outpatient follow-up.  - Small cystocele, suggestive of pelvic floor dysfunction. Narrative  EXAM: CT ABDOMEN PELVIS W CONTRAST ACCESSION: [id number] UN CLINICAL INDICATION: 77 years old with Chest discomfort, shortness of breath, new initiation of opiates prior history of breast cancer  COMPARISON: Outside MRI report dated 08/05/2017  TECHNIQUE: A helical CT scan of the abdomen and pelvis was obtained following IV contrast from the lung bases through the pubic symphysis. Images were reconstructed in the axial plane. Coronal and sagittal reformatted images were also provided for further evaluation.   FINDINGS:  LOWER CHEST: Subsegmental atelectasis in the bilateral lung bases. No pleural effusion. No pericardial effusion.  LIVER: Normal liver contour. Heterogeneously enhancing lesion within hepatic segment VIII (2:34) which measures 3.0 x 2.6 cm. This lesion demonstrates heterogeneous peripheral enhancement. Additional hypoattenuating lesion in hepatic segment VII measuring 0.6 cm (2:17), too small to characterize  BILIARY: The gallbladder is normal in appearance. No biliary ductal dilatation.    SPLEEN: Normal in size and contour.  PANCREAS: Normal pancreatic contour.  No focal lesions.  No ductal dilation.  ADRENAL GLANDS: Normal appearance of the adrenal glands.  KIDNEYS/URETERS: Symmetric renal enhancement.  No  hydronephrosis.  No solid renal mass. Bilateral subcentimeter hypoattenuating lesions, too small for characterization. Nonobstructing punctate right renal calculi (4:54). Nonspecific bilateral perinephric stranding, likely physiologic.  BLADDER: Small cystocele, otherwise unremarkable.  REPRODUCTIVE ORGANS: Uterus is surgically absent. No adnexal masses. Normal appearance of the vaginal cuff.  GI TRACT: Small sliding-type hiatal  hernia. No focal gastric wall thickening. Duodenum is normal in course and caliber. No findings of bowel obstruction or acute inflammation. Scattered colonic diverticulosis without acute inflammation. The appendix is unremarkable (2:90).  PERITONEUM, RETROPERITONEUM AND MESENTERY: No free air.  No ascites. Trace free fluid within the pelvis.  LYMPH NODES: No adenopathy by size criteria.  VESSELS: Hepatic and portal veins are patent.  Normal caliber aorta.    BONES and SOFT TISSUES: Multilevel degenerative changes of the thoracolumbar spine. Minimal grade 1 anterolisthesis of L4 on L5. No aggressive osseous lesions.  No focal soft tissue lesions. Procedure Note  Mosetta Anis, MD - 04/27/2022 Formatting of this note might be different from the original. EXAM: CT ABDOMEN PELVIS W CONTRAST ACCESSION: [id number] UN CLINICAL INDICATION: 77 years old with Chest discomfort, shortness of breath, new initiation of opiates prior history of breast cancer  COMPARISON: Outside MRI report dated 08/05/2017  TECHNIQUE: A helical CT scan of the abdomen and pelvis was obtained following IV contrast from the lung bases through the pubic symphysis. Images were reconstructed in the axial plane. Coronal and sagittal reformatted images were also provided for further evaluation.   FINDINGS:  LOWER CHEST: Subsegmental atelectasis in the bilateral lung bases. No pleural effusion. No pericardial effusion.  LIVER: Normal liver contour. Heterogeneously enhancing lesion within hepatic segment VIII (2:34) which measures 3.0 x 2.6 cm. This lesion demonstrates heterogeneous peripheral enhancement. Additional hypoattenuating lesion in hepatic segment VII measuring 0.6 cm (2:17), too small to characterize  BILIARY: The gallbladder is normal in appearance. No biliary ductal dilatation.    SPLEEN: Normal in size and contour.  PANCREAS: Normal pancreatic contour.  No focal lesions.  No ductal dilation.  ADRENAL  GLANDS: Normal appearance of the adrenal glands.  KIDNEYS/URETERS: Symmetric renal enhancement.  No hydronephrosis.  No solid renal mass. Bilateral subcentimeter hypoattenuating lesions, too small for characterization. Nonobstructing punctate right renal calculi (4:54). Nonspecific bilateral perinephric stranding, likely physiologic.  BLADDER: Small cystocele, otherwise unremarkable.  REPRODUCTIVE ORGANS: Uterus is surgically absent. No adnexal masses. Normal appearance of the vaginal cuff.  GI TRACT: Small sliding-type hiatal hernia. No focal gastric wall thickening. Duodenum is normal in course and caliber. No findings of bowel obstruction or acute inflammation. Scattered colonic diverticulosis without acute inflammation. The appendix is unremarkable (2:90).  PERITONEUM, RETROPERITONEUM AND MESENTERY: No free air.  No ascites. Trace free fluid within the pelvis.  LYMPH NODES: No adenopathy by size criteria.  VESSELS: Hepatic and portal veins are patent.  Normal caliber aorta.    BONES and SOFT TISSUES: Multilevel degenerative changes of the thoracolumbar spine. Minimal grade 1 anterolisthesis of L4 on L5. No aggressive osseous lesions.  No focal soft tissue lesions.   IMPRESSION: -No acute abnormality in the abdomen or pelvis explain patient's symptoms.  -Heterogeneously enhancing lesion in hepatic segment VIII measuring 3.0 cm, indeterminate. This was previously characterized as an atypical hemangioma on outside MRI from 2019, although follow-up MRI was recommended at that time given atypical features. Recommend continued outpatient follow-up.  - Small cystocele, suggestive of pelvic floor dysfunction.  EXAM: XR CHEST 2 VIEWS ACCESSION: [id number] UN  CLINICAL INDICATION: SHORTNESS OF BREATH    TECHNIQUE: PA and  Lateral Chest Radiographs.  COMPARISON: None  FINDINGS:  Relatively symmetric reticular opacities in the lungs. No focal consolidation. No pleural effusion or  pneumothorax.  Normal heart size and mediastinal contours. Procedure Note  Terrilyn Saver, MD - 04/27/2022 Formatting of this note might be different from the original. EXAM: XR CHEST 2 VIEWS ACCESSION: [id number] UN  CLINICAL INDICATION: SHORTNESS OF BREATH    TECHNIQUE: PA and Lateral Chest Radiographs.  COMPARISON: None  FINDINGS:  Relatively symmetric reticular opacities in the lungs. No focal consolidation. No pleural effusion or pneumothorax.  Normal heart size and mediastinal contours.  IMPRESSION:  Interstitial edema.  Exam Type:     ECHOCARDIOGRAM W COLORFLOW SPECTRAL DOPPLER  Technical Quality:     Fair  Staff Sonographer:     Hipolito Bayley  Study Info Indications      - New heart failure Procedure(s)   Complete two-dimensional, color flow and Doppler transthoracic echocardiogram is performed.   Summary   1. The left ventricle is normal in size with upper normal wall thickness.   2. The left ventricular systolic function is normal, LVEF is visually estimated at > 55%.   3. The right ventricle is normal in size, with normal systolic function.   Left Ventricle   The left ventricle is normal in size with upper normal wall thickness. The left ventricular systolic function is normal, LVEF is visually estimated at > 55%. The left ventricular diastolic function is indeterminate. LV global longitudinal strain: -17.4 %.  Right Ventricle   The right ventricle is normal in size, with normal systolic function.   Left Atrium   The left atrium is normal in size.  Right Atrium   The right atrium is normal in size.   Aortic Valve   The aortic valve is trileaflet with normal appearing leaflets with normal excursion. There is no significant aortic regurgitation. There is no evidence of a significant transvalvular gradient.  Mitral Valve   The mitral valve leaflets are normal with normal leaflet mobility. There is no significant mitral valve  regurgitation.  Tricuspid Valve   The tricuspid valve leaflets are normal, with normal leaflet mobility. There is trivial tricuspid regurgitation. There is no pulmonary hypertension. TR maximum velocity: 1.8 m/s  Estimated PASP: 15 mmHg.  Pulmonic Valve   The pulmonic valve is poorly visualized, but probably normal. There is no significant pulmonic regurgitation. There is no evidence of a significant transvalvular gradient.   Aorta   The aorta is normal in size in the visualized segments.  Inferior Vena Cava   IVC size and inspiratory change suggest normal right atrial pressure. (0-5 mmHg).  Pericardium/Pleural   There is a trivial pericardial effusion.   Ventricles ---------------------------------------------------------------------- Name                                 Value        Normal ----------------------------------------------------------------------  LV Dimensions 2D/MM ----------------------------------------------------------------------  IVS Diastolic Thickness (2D)                                1.1 cm       0.6-0.9 LVID Diastole (2D)                  3.8 cm       [id number]  LVPW Diastolic Thickness (2D)  1.0 cm       0.6-0.9 LVID Systole (2D)                   2.7 cm       2.2-3.5  LV Function ---------------------------------------------------------------------- Global Longitudinal Strain         -17.4 %                RV Dimensions 2D/MM ----------------------------------------------------------------------  RV Basal Diastolic Dimension                           3.5 cm       2.5-4.1 TAPSE                               2.0 cm         >=1.7  Atria ---------------------------------------------------------------------- Name                                 Value        Normal ----------------------------------------------------------------------  LA  Dimensions ---------------------------------------------------------------------- LA Dimension (2D)                   3.2 cm       2.7-3.8 LA Volume Index (4C A-L)        21.31 ml/m2               LA Volume Index (2C A-L)        31.22 ml/m2               LA Volume (BP MOD)                   39 ml               LA Volume Index (BP MOD)        25.24 ml/m2   16.00-34.00  RA Dimensions ---------------------------------------------------------------------- RA Area (4C)                      12.8 cm2        <=18.0 RA Area (4C) Index              8.3 cm2/m2               RA ESV Index (4C MOD)             20 ml/m2         15-27  Aortic Valve ---------------------------------------------------------------------- Name                                 Value        Normal ----------------------------------------------------------------------  AV Doppler ---------------------------------------------------------------------- AV Peak Velocity                   1.1 m/s               AV Peak Gradient                    5 mmHg  Mitral Valve ---------------------------------------------------------------------- Name                                 Value  Normal ----------------------------------------------------------------------  MV Diastolic Function ---------------------------------------------------------------------- MV E Peak Velocity                 94 cm/s               MV A Peak Velocity                102 cm/s               MV E/A                                 0.9                MV Annular TDI ---------------------------------------------------------------------- MV Septal e' Velocity             5.0 cm/s         >=8.0 MV E/e' (Septal)                      18.9               MV Lateral e' Velocity            7.4 cm/s        >=10.0 MV E/e' (Lateral)                     12.7               MV e' Average                     6.2 cm/s               MV E/e' (Average)                      15.8  Tricuspid Valve ---------------------------------------------------------------------- Name                                 Value        Normal ----------------------------------------------------------------------  TV Regurgitation Doppler ---------------------------------------------------------------------- TR Peak Velocity                   1.8 m/s                Estimated PAP/RSVP ---------------------------------------------------------------------- RA Pressure                         3 mmHg           <=5 RV Systolic Pressure               15 mmHg           <36  Pulmonic Valve ---------------------------------------------------------------------- Name                                 Value        Normal ----------------------------------------------------------------------  PV Doppler ---------------------------------------------------------------------- PV Peak Velocity                   0.7 m/s  Aorta ---------------------------------------------------------------------- Name                                 Value  Normal ----------------------------------------------------------------------  Ascending Aorta ---------------------------------------------------------------------- Ao Root Diameter (2D)               2.8 cm               Ao Root Diam Index (2D)          1.8 cm/m2               Asc Ao Diameter                     3.3 cm  Venous ---------------------------------------------------------------------- Name                                 Value        Normal ----------------------------------------------------------------------  IVC/SVC ---------------------------------------------------------------------- IVC Diameter (Insp 2D)              0.7 cm               IVC Diameter (Exp 2D)               1.4 cm         <=2.1  IVC Diameter Percent Change (2D)                                  53 %          >=50   Report Signatures Finalized by  Randol Kern  MD on 04/29/2022 10:00 AM Procedure Note  Randol Kern, MD - 04/29/2022 Formatting of this note might be different from the original. Patient Info Name:     Casey Summers Age:     73 years DOB:     23-Apr-1945 Gender:     Female MRN:     [id number] Accession #:     [id number] UN Account #:     [id number] Ht:     152 cm Wt:     56 kg BSA:     1.55 m2 BP:     95 /     52 mmHg HR:     81 bpm Heart Rhythm:     Sinus Rhythm Exam Date:     04/29/2022 9:13 AM Admit Date:     04/27/2022  Exam Type:     ECHOCARDIOGRAM W COLORFLOW SPECTRAL DOPPLER  Technical Quality:     Fair  Staff Sonographer:     Hipolito Bayley  Study Info Indications      - New heart failure Procedure(s)   Complete two-dimensional, color flow and Doppler transthoracic echocardiogram is performed.   Summary   1. The left ventricle is normal in size with upper normal wall thickness.   2. The left ventricular systolic function is normal, LVEF is visually estimated at > 55%.   3. The right ventricle is normal in size, with normal systolic function.   Left Ventricle   The left ventricle is normal in size with upper normal wall thickness. The left ventricular systolic function is normal, LVEF is visually estimated at > 55%. The left ventricular diastolic function is indeterminate. LV global longitudinal strain: -17.4 %.  Right Ventricle   The right ventricle is normal in size, with normal systolic function.   Left Atrium   The left atrium is normal in size.  Right Atrium   The right atrium is normal in size.   Aortic Valve  The aortic valve is trileaflet with normal appearing leaflets with normal excursion. There is no significant aortic regurgitation. There is no evidence of a significant transvalvular gradient.  Mitral Valve   The mitral valve leaflets are normal with normal leaflet mobility. There is no significant mitral valve  regurgitation.  Tricuspid Valve   The tricuspid valve leaflets are normal, with normal leaflet mobility. There is trivial tricuspid regurgitation. There is no pulmonary hypertension. TR maximum velocity: 1.8 m/s  Estimated PASP: 15 mmHg.  Pulmonic Valve   The pulmonic valve is poorly visualized, but probably normal. There is no significant pulmonic regurgitation. There is no evidence of a significant transvalvular gradient.   Aorta   The aorta is normal in size in the visualized segments.  Inferior Vena Cava   IVC size and inspiratory change suggest normal right atrial pressure. (0-5 mmHg).  Pericardium/Pleural   There is a trivial pericardial effusion.   Ventricles ---------------------------------------------------------------------- Name                                 Value        Normal ----------------------------------------------------------------------  LV Dimensions 2D/MM ----------------------------------------------------------------------  IVS Diastolic Thickness (2D)                                1.1 cm       0.6-0.9 LVID Diastole (2D)                  3.8 cm       [id number]  LVPW Diastolic Thickness (2D)                                1.0 cm       0.6-0.9 LVID Systole (2D)                   2.7 cm       2.2-3.5  LV Function ---------------------------------------------------------------------- Global Longitudinal Strain         -17.4 %                RV Dimensions 2D/MM ----------------------------------------------------------------------  RV Basal Diastolic Dimension                           3.5 cm       2.5-4.1 TAPSE                               2.0 cm         >=1.7  Atria ---------------------------------------------------------------------- Name                                 Value        Normal ----------------------------------------------------------------------  LA  Dimensions ---------------------------------------------------------------------- LA Dimension (2D)                   3.2 cm       2.7-3.8 LA Volume Index (4C A-L)        21.31 ml/m2               LA Volume Index (2C A-L)        31.22 ml/m2  LA Volume (BP MOD)                   39 ml               LA Volume Index (BP MOD)        25.24 ml/m2   16.00-34.00  RA Dimensions ---------------------------------------------------------------------- RA Area (4C)                      12.8 cm2        <=18.0 RA Area (4C) Index              8.3 cm2/m2               RA ESV Index (4C MOD)             20 ml/m2         15-27  Aortic Valve ---------------------------------------------------------------------- Name                                 Value        Normal ----------------------------------------------------------------------  AV Doppler ---------------------------------------------------------------------- AV Peak Velocity                   1.1 m/s               AV Peak Gradient                    5 mmHg  Mitral Valve ---------------------------------------------------------------------- Name                                 Value        Normal ----------------------------------------------------------------------  MV Diastolic Function ---------------------------------------------------------------------- MV E Peak Velocity                 94 cm/s               MV A Peak Velocity                102 cm/s               MV E/A                                 0.9                MV Annular TDI ---------------------------------------------------------------------- MV Septal e' Velocity             5.0 cm/s         >=8.0 MV E/e' (Septal)                      18.9               MV Lateral e' Velocity            7.4 cm/s        >=10.0 MV E/e' (Lateral)                     12.7               MV e' Average                     6.2 cm/s  MV E/e' (Average)                      15.8  Tricuspid Valve ---------------------------------------------------------------------- Name                                 Value        Normal ----------------------------------------------------------------------  TV Regurgitation Doppler ---------------------------------------------------------------------- TR Peak Velocity                   1.8 m/s                Estimated PAP/RSVP ---------------------------------------------------------------------- RA Pressure                         3 mmHg           <=5 RV Systolic Pressure               15 mmHg           <36  Pulmonic Valve ---------------------------------------------------------------------- Name                                 Value        Normal ----------------------------------------------------------------------  PV Doppler ---------------------------------------------------------------------- PV Peak Velocity                   0.7 m/s  Aorta ---------------------------------------------------------------------- Name                                 Value        Normal ----------------------------------------------------------------------  Ascending Aorta ---------------------------------------------------------------------- Ao Root Diameter (2D)               2.8 cm               Ao Root Diam Index (2D)          1.8 cm/m2               Asc Ao Diameter                     3.3 cm  Venous ---------------------------------------------------------------------- Name                                 Value        Normal ----------------------------------------------------------------------  IVC/SVC ---------------------------------------------------------------------- IVC Diameter (Insp 2D)              0.7 cm               IVC Diameter (Exp 2D)               1.4 cm         <=2.1  IVC Diameter Percent Change (2D)                                  53 %          >=50   Disposition: Home  Consults:  Cardiology  Discharge Instructions: Follow up here and with cardiology  Disease/illness Education: Discussed today  Home Health/Community Services Discussions/Referrals: In place  Establishment or re-establishment of referral orders for community  resources:  In place  Discussion with other health care providers: None  Assessment and Support of treatment regimen adherence: Good  Appointments Coordinated with: Patient and daughter  Education for self-management, independent living, and ADLs: Discussed today  Has been tired since getting out of the hospital. She has been weighing herself. She has been monitoring her salt intake. She notes that she has not been short of breath. She has been able to lay flat. She notes that her head is still bad, but it's not as bad as it was when she was here last time. Her daughter notes that she has not been on her emgality for a few months. She has been having some issues with insurance, and has been off of it for quite a while. She is otherwise doing well with no other concerns or complaints at this time.   Relevant past medical, surgical, family and social history reviewed and updated as indicated. Interim medical history since our last visit reviewed. Allergies and medications reviewed and updated.  Review of Systems  Constitutional:  Positive for fatigue. Negative for activity change, appetite change, chills, diaphoresis, fever and unexpected weight change.  HENT: Negative.    Respiratory: Negative.    Cardiovascular: Negative.   Gastrointestinal: Negative.   Neurological:  Positive for headaches. Negative for dizziness, tremors, seizures, syncope, facial asymmetry, speech difficulty, weakness, light-headedness and numbness.  Hematological: Negative.   Psychiatric/Behavioral: Negative.      Per HPI unless specifically indicated above     Objective:    BP 131/82   Pulse 70   Temp 97.7 F (36.5 C) (Oral)   Wt 124 lb 6.4 oz (56.4 kg)    SpO2 98%   BMI 24.71 kg/m   Wt Readings from Last 3 Encounters:  04/30/22 124 lb 6.4 oz (56.4 kg)  04/23/22 125 lb 1.6 oz (56.7 kg)  04/03/22 123 lb 1.6 oz (55.8 kg)    Physical Exam Vitals and nursing note reviewed.  Constitutional:      General: She is not in acute distress.    Appearance: Normal appearance. She is normal weight. She is not ill-appearing, toxic-appearing or diaphoretic.  HENT:     Head: Normocephalic and atraumatic.     Right Ear: External ear normal.     Left Ear: External ear normal.     Nose: Nose normal.     Mouth/Throat:     Mouth: Mucous membranes are moist.     Pharynx: Oropharynx is clear.  Eyes:     General: No scleral icterus.       Right eye: No discharge.        Left eye: No discharge.     Extraocular Movements: Extraocular movements intact.     Conjunctiva/sclera: Conjunctivae normal.     Pupils: Pupils are equal, round, and reactive to light.  Cardiovascular:     Rate and Rhythm: Normal rate and regular rhythm.     Pulses: Normal pulses.     Heart sounds: Normal heart sounds. No murmur heard.    No friction rub. No gallop.  Pulmonary:     Effort: Pulmonary effort is normal. No respiratory distress.     Breath sounds: Normal breath sounds. No stridor. No wheezing, rhonchi or rales.  Chest:     Chest wall: No tenderness.  Musculoskeletal:        General: Normal range of motion.     Cervical back: Normal range of motion and neck supple.  Skin:    General: Skin  is warm and dry.     Capillary Refill: Capillary refill takes less than 2 seconds.     Coloration: Skin is not jaundiced or pale.     Findings: No bruising, erythema, lesion or rash.  Neurological:     General: No focal deficit present.     Mental Status: She is alert and oriented to person, place, and time. Mental status is at baseline.  Psychiatric:        Mood and Affect: Mood normal.        Behavior: Behavior normal.        Thought Content: Thought content normal.         Judgment: Judgment normal.     Results for orders placed or performed in visit on 04/30/22  CBC with Differential/Platelet  Result Value Ref Range   WBC 5.5 3.4 - 10.8 x10E3/uL   RBC 3.49 (L) 3.77 - 5.28 x10E6/uL   Hemoglobin 12.1 11.1 - 15.9 g/dL   Hematocrit 36.1 34.0 - 46.6 %   MCV 103 (H) 79 - 97 fL   MCH 34.7 (H) 26.6 - 33.0 pg   MCHC 33.5 31.5 - 35.7 g/dL   RDW 11.8 11.7 - 15.4 %   Platelets 223 150 - 450 x10E3/uL   Neutrophils 72 Not Estab. %   Lymphs 9 Not Estab. %   Monocytes 14 Not Estab. %   Eos 3 Not Estab. %   Basos 1 Not Estab. %   Neutrophils Absolute 4.0 1.4 - 7.0 x10E3/uL   Lymphocytes Absolute 0.5 (L) 0.7 - 3.1 x10E3/uL   Monocytes Absolute 0.8 0.1 - 0.9 x10E3/uL   EOS (ABSOLUTE) 0.2 0.0 - 0.4 x10E3/uL   Basophils Absolute 0.0 0.0 - 0.2 x10E3/uL   Immature Granulocytes 1 Not Estab. %   Immature Grans (Abs) 0.0 0.0 - 0.1 x10E3/uL  Comprehensive metabolic panel  Result Value Ref Range   Glucose 81 70 - 99 mg/dL   BUN 10 8 - 27 mg/dL   Creatinine, Ser 0.71 0.57 - 1.00 mg/dL   eGFR 89 >59 mL/min/1.73   BUN/Creatinine Ratio 14 12 - 28   Sodium 134 134 - 144 mmol/L   Potassium 4.1 3.5 - 5.2 mmol/L   Chloride 97 96 - 106 mmol/L   CO2 20 20 - 29 mmol/L   Calcium 9.3 8.7 - 10.3 mg/dL   Total Protein 6.3 6.0 - 8.5 g/dL   Albumin 3.9 3.8 - 4.8 g/dL   Globulin, Total 2.4 1.5 - 4.5 g/dL   Albumin/Globulin Ratio 1.6 1.2 - 2.2   Bilirubin Total 0.4 0.0 - 1.2 mg/dL   Alkaline Phosphatase 200 (H) 44 - 121 IU/L   AST 32 0 - 40 IU/L   ALT 67 (H) 0 - 32 IU/L      Assessment & Plan:   Problem List Items Addressed This Visit       Cardiovascular and Mediastinum   Liver hemangioma    Stable on her last exam. Continue to monitor. Call with any concerns.       Relevant Medications   losartan (COZAAR) 50 MG tablet   furosemide (LASIX) 20 MG tablet   Congestive heart failure - Primary    Newly diagnosed at [facility]. To follow up with cardiology.  Euvolemic today. BP doing OK on current regimen. Continue to monitor. Call with any concerns.       Relevant Medications   losartan (COZAAR) 50 MG tablet   furosemide (LASIX) 20 MG tablet   Other  Relevant Orders   CBC with Differential/Platelet (Completed)   Comprehensive metabolic panel (Completed)     Other   Chronic intractable headache    Here today with her daughter. Has not been on her emgality for unclear amount of time. Will restart it. Due to see neurology in about 10 days. Follow up with them as scheduled. Call with any concerns.       Relevant Medications   Galcanezumab-gnlm (EMGALITY) 120 MG/ML SOAJ     Follow up plan: Return as scheduled.

## 2022-05-01 ENCOUNTER — Telehealth: Payer: Self-pay | Admitting: Family Medicine

## 2022-05-01 LAB — CBC WITH DIFFERENTIAL/PLATELET
Basophils Absolute: 0 10*3/uL (ref 0.0–0.2)
Basos: 1 %
EOS (ABSOLUTE): 0.2 10*3/uL (ref 0.0–0.4)
Eos: 3 %
Hematocrit: 36.1 % (ref 34.0–46.6)
Hemoglobin: 12.1 g/dL (ref 11.1–15.9)
Immature Grans (Abs): 0 10*3/uL (ref 0.0–0.1)
Immature Granulocytes: 1 %
Lymphocytes Absolute: 0.5 10*3/uL — ABNORMAL LOW (ref 0.7–3.1)
Lymphs: 9 %
MCH: 34.7 pg — ABNORMAL HIGH (ref 26.6–33.0)
MCHC: 33.5 g/dL (ref 31.5–35.7)
MCV: 103 fL — ABNORMAL HIGH (ref 79–97)
Monocytes Absolute: 0.8 10*3/uL (ref 0.1–0.9)
Monocytes: 14 %
Neutrophils Absolute: 4 10*3/uL (ref 1.4–7.0)
Neutrophils: 72 %
Platelets: 223 10*3/uL (ref 150–450)
RBC: 3.49 x10E6/uL — ABNORMAL LOW (ref 3.77–5.28)
RDW: 11.8 % (ref 11.7–15.4)
WBC: 5.5 10*3/uL (ref 3.4–10.8)

## 2022-05-01 LAB — COMPREHENSIVE METABOLIC PANEL
ALT: 67 IU/L — ABNORMAL HIGH (ref 0–32)
AST: 32 IU/L (ref 0–40)
Albumin/Globulin Ratio: 1.6 (ref 1.2–2.2)
Albumin: 3.9 g/dL (ref 3.8–4.8)
Alkaline Phosphatase: 200 IU/L — ABNORMAL HIGH (ref 44–121)
BUN/Creatinine Ratio: 14 (ref 12–28)
BUN: 10 mg/dL (ref 8–27)
Bilirubin Total: 0.4 mg/dL (ref 0.0–1.2)
CO2: 20 mmol/L (ref 20–29)
Calcium: 9.3 mg/dL (ref 8.7–10.3)
Chloride: 97 mmol/L (ref 96–106)
Creatinine, Ser: 0.71 mg/dL (ref 0.57–1.00)
Globulin, Total: 2.4 g/dL (ref 1.5–4.5)
Glucose: 81 mg/dL (ref 70–99)
Potassium: 4.1 mmol/L (ref 3.5–5.2)
Sodium: 134 mmol/L (ref 134–144)
Total Protein: 6.3 g/dL (ref 6.0–8.5)
eGFR: 89 mL/min/{1.73_m2} (ref >59)

## 2022-05-01 NOTE — Telephone Encounter (Signed)
Spoke with [facility] to inform them that the prior authorization was initiated and approved for the prescription of EMGALITY. Per the pharmacist informed me that they could fill the prescription for the patient, but it would be $170 out of pocket after the insurance.   OK for [facility] to give note if patient daughter Casey Summers calls back.

## 2022-05-01 NOTE — Telephone Encounter (Signed)
Copied from [place] [id number]. Topic: General - Other >> Apr 30, 2022  5:39 PM Oley Balm E wrote: Reason for CRM: Pt's daughter called reporting that the insurance/pharmacy needs a Prior Auth proving that the previous medication she tried previously did not work and that is why there is now an alternative. Pt's daughter does not know the names of anything.

## 2022-05-02 DIAGNOSIS — I509 Heart failure, unspecified: Secondary | ICD-10-CM | POA: Insufficient documentation

## 2022-05-02 MED ORDER — EMGALITY 120 MG/ML ~~LOC~~ SOAJ: 120.0000 mg | Freq: Once | SUBCUTANEOUS | 0 refills | Status: AC

## 2022-05-02 NOTE — Assessment & Plan Note (Signed)
Newly diagnosed at [facility]. To follow up with cardiology. Euvolemic today. BP doing OK on current regimen. Continue to monitor. Call with any concerns.

## 2022-05-02 NOTE — Telephone Encounter (Signed)
Left message for patient's daughter to give our office a call back in regards to her mother's prescription.   OK for [facility] to give note if return calls.

## 2022-05-02 NOTE — Assessment & Plan Note (Signed)
Stable on her last exam. Continue to monitor. Call with any concerns.

## 2022-05-05 DIAGNOSIS — R9431 Abnormal electrocardiogram [ECG] [EKG]: Secondary | ICD-10-CM | POA: Diagnosis not present

## 2022-05-06 ENCOUNTER — Encounter: Payer: Self-pay | Admitting: Surgery

## 2022-05-06 ENCOUNTER — Other Ambulatory Visit: Payer: Self-pay | Admitting: Family Medicine

## 2022-05-06 ENCOUNTER — Ambulatory Visit (INDEPENDENT_AMBULATORY_CARE_PROVIDER_SITE_OTHER): Payer: Medicare Other | Admitting: Surgery

## 2022-05-06 ENCOUNTER — Other Ambulatory Visit: Payer: Self-pay | Admitting: Surgery

## 2022-05-06 VITALS — BP 124/64 | HR 69 | Temp 98.1°F | Ht 60.0 in | Wt 124.0 lb

## 2022-05-06 DIAGNOSIS — H539 Unspecified visual disturbance: Secondary | ICD-10-CM

## 2022-05-06 DIAGNOSIS — R519 Headache, unspecified: Secondary | ICD-10-CM | POA: Diagnosis not present

## 2022-05-06 DIAGNOSIS — Z9889 Other specified postprocedural states: Secondary | ICD-10-CM

## 2022-05-06 DIAGNOSIS — I7789 Other specified disorders of arteries and arterioles: Secondary | ICD-10-CM | POA: Diagnosis not present

## 2022-05-06 NOTE — Patient Instructions (Addendum)
If you have any concerns or questions, please feel free to call our office. We will call you with the biopsy report. Follow up as needed.  Temporal Artery Biopsy  Arteries are blood vessels that carry blood from the heart to the rest of the body. Temporal arteries are found on the side of the head and between the ears and eyes. During a temporal artery biopsy, a sample of the temporal artery is removed and examined under a microscope. This procedure may be done to see if you have a condition that causes your arteries to become swollen or inflamed (temporal arteritis or giant cell arteritis). Tell a health care provider about: Any allergies you have. All medicines you are taking, including vitamins, herbs, eye drops, creams, and over-the-counter medicines. Any problems you or family members have had with anesthetic medicines. Any blood disorders you have. Any surgeries you have had. Any medical conditions you have or have had. Whether you are pregnant or may be pregnant. What are the risks? Generally, this is a safe procedure. However, problems may occur, including: Bleeding. A collection of blood under the skin (hematoma). Infection. Nerve damage in the temples. This can cause numbness or make the muscles in your face weak. Scarring. On the scalp, hair may not grow around the scar. What happens before the procedure? You may have blood tests to make sure that your blood clots normally. Ask your health care provider about: Changing or stopping your regular medicines. This is especially important if you are taking diabetes medicines or blood thinners. Taking medicines such as aspirin and ibuprofen. These medicines can thin your blood. Do not take these medicines unless your health care provider tells you to take them. Taking over-the-counter medicines, vitamins, herbs, and supplements. Follow instructions from your health care provider about eating or drinking restrictions. Plan to have  someone take you home from the hospital or clinic. If you will be going home right after the procedure, plan to have someone with you for 24 hours. Ask your health care provider: How your surgery site will be marked. What steps will be taken to help prevent infection. These may include: Removing hair at the surgery site. Washing skin with a germ-killing soap. Taking antibiotic medicine. What happens during the procedure? Small monitors will be put on your body. They will be used to check your heart, blood pressure, and oxygen level. An IV will be inserted into one of your veins. You will be given one or more of the following: A medicine to help you relax (sedative). A medicine to numb the area (local anesthetic). A tool called a Doppler may be used to find the artery. An incision will be made over the temporal artery. Two clamps will be placed on the artery. Then, a small piece of the artery between the clamps will be cut and removed. The remaining ends of the artery will be closed with stitches (sutures) to avoid bleeding. The clamps will then be removed. The incision will be closed with sutures. A bandage (dressing) may be placed on the incision. The artery piece that was taken out will be checked under a microscope. The procedure may vary among health care providers and hospitals. What happens after the procedure? Your blood pressure, heart rate, breathing rate, and blood oxygen level may be monitored until you leave the hospital or clinic. You may be given medicine for pain. Do not drive for 24 hours if you were given a sedative during your procedure. Summary During a temporal  artery biopsy, a sample of the temporal artery is removed and examined under a microscope. This procedure may be done to see if you have a condition that causes your arteries to become swollen or inflamed (temporal arteritis or giant cell arteritis). Before the procedure, follow instructions from your health  care provider about changing or stopping your medicines and about any eating or drinking restrictions. Plan to have someone take you home after the procedure and have someone with you for 24 hours. During the procedure, the incision will be closed with sutures, and a bandage (dressing) may be placed on the incision. This information is not intended to replace advice given to you by your health care provider. Make sure you discuss any questions you have with your health care provider. Document Revised: 03/27/2020 Document Reviewed: 03/28/2020 Elsevier Patient Education  2023 [employer].

## 2022-05-06 NOTE — Telephone Encounter (Signed)
Medication Refill - Medication: gabapentin (NEURONTIN) 300 MG capsule   Has the patient contacted their pharmacy? Yes.    Preferred Pharmacy (with phone number or street name):  [phone]reys,  - [address]  [address] [place] [address]  Phone: [phone] Fax: [phone]  Hours: Not open 24 hours     Has the patient been seen for an appointment in the last year OR does the patient have an upcoming appointment? Yes.    Agent: Please be advised that RX refills may take up to 3 business days. We ask that you follow-up with your pharmacy.

## 2022-05-07 ENCOUNTER — Telehealth: Payer: Medicare Other | Admitting: Family Medicine

## 2022-05-07 ENCOUNTER — Ambulatory Visit: Payer: Self-pay

## 2022-05-07 ENCOUNTER — Telehealth: Payer: Self-pay | Admitting: Family Medicine

## 2022-05-07 ENCOUNTER — Telehealth: Payer: Self-pay | Admitting: *Deleted

## 2022-05-07 NOTE — Telephone Encounter (Signed)
[facility] # [phone] called and spoke to SJ, [facility] about the refill(s) gabapentin requested. Advised it was sent on 04/17/22 #270/3 refill(s). She states that it is filled and ready for pick up but pt hasn't picked up yet. Also unable to approve thru insurance so pt paying $26 out of pocket for medication since insurance is saying exceeds limit last Filled 03/26/22 for 90 DS. They did receive dose change and the 3 RF so will refuse request.   Requested Prescriptions  Pending Prescriptions Disp Refills   gabapentin (NEURONTIN) 300 MG capsule 270 capsule 3    Sig: Take 3 capsules (900 mg total) by mouth 3 (three) times daily.     Neurology: Anticonvulsants - gabapentin Passed - 05/06/2022 11:06 AM      Passed - Cr in normal range and within 360 days    Creatinine  Date Value Ref Range Status  02/22/2019 116.9 20.0 - 300.0 mg/dL Final   Creatinine, Ser  Date Value Ref Range Status  04/30/2022 0.71 0.57 - 1.00 mg/dL Final         Passed - Completed PHQ-2 or PHQ-9 in the last 360 days      Passed - Valid encounter within last 12 months    Recent Outpatient Visits           1 week ago Congestive heart failure, unspecified HF chronicity, unspecified heart failure type   [place] [facility] [place], Megan P, DO   2 weeks ago Chronic intractable headache, unspecified headache type   [facility] [place], Megan P, DO   1 month ago Chronic intractable headache, unspecified headache type   [place] [place], Megan P, DO   1 month ago Chronic intractable headache, unspecified headache type   [place] [facility] M[place]um, Oswaldo Conroy, PA-C   8 months ago Primary hypertension   [place] [facility] [place], [place], DO       Future Appointments             In 2 weeks Laural Benes, Oralia Rud, DO  [facility], [facility]

## 2022-05-07 NOTE — Telephone Encounter (Signed)
Please call the pharmacy and find out why.

## 2022-05-07 NOTE — Telephone Encounter (Signed)
Requested medication (s) are due for refill today - no  Requested medication (s) are on the active medication list -yes  Future visit scheduled -yes  Last refill: 04/17/22  Notes to clinic: Pharmacy request: over insurance max-capsule/day- need to increase dosing of capsule for coverage  Requested Prescriptions  Pending Prescriptions Disp Refills   gabapentin (NEURONTIN) 300 MG capsule [Pharmacy Med Name: GABAPENTIN 300 MG CAPSULE] 270 capsule 3    Sig: TAKE 3 CAPSULES BY MOUTH 3 TIMES DAILY.     Neurology: Anticonvulsants - gabapentin Passed - 05/06/2022 11:11 AM      Passed - Cr in normal range and within 360 days    Creatinine  Date Value Ref Range Status  02/22/2019 116.9 20.0 - 300.0 mg/dL Final   Creatinine, Ser  Date Value Ref Range Status  04/30/2022 0.71 0.57 - 1.00 mg/dL Final         Passed - Completed PHQ-2 or PHQ-9 in the last 360 days      Passed - Valid encounter within last 12 months    Recent Outpatient Visits           1 week ago Congestive heart failure, unspecified HF chronicity, unspecified heart failure type   [place] [facility] [place], Megan P, DO   2 weeks ago Chronic intractable headache, unspecified headache type   [facility] [place], Megan P, DO   1 month ago Chronic intractable headache, unspecified headache type   [place] [facility] [place], Megan P, DO   1 month ago Chronic intractable headache, unspecified headache type   [place] [facility] [place], Oswaldo Conroy, PA-C   8 months ago Primary hypertension   [place] [facility] [place], [place], DO       Future Appointments             In 2 weeks Laural Benes, Oralia Rud, DO [place] [facility], [facility]               Requested Prescriptions  Pending Prescriptions Disp Refills   gabapentin (NEURONTIN) 300 MG capsule [Pharmacy Med Name: GABAPENTIN 300 MG CAPSULE] 270 capsule 3    Sig: TAKE  3 CAPSULES BY MOUTH 3 TIMES DAILY.     Neurology: Anticonvulsants - gabapentin Passed - 05/06/2022 11:11 AM      Passed - Cr in normal range and within 360 days    Creatinine  Date Value Ref Range Status  02/22/2019 116.9 20.0 - 300.0 mg/dL Final   Creatinine, Ser  Date Value Ref Range Status  04/30/2022 0.71 0.57 - 1.00 mg/dL Final         Passed - Completed PHQ-2 or PHQ-9 in the last 360 days      Passed - Valid encounter within last 12 months    Recent Outpatient Visits           1 week ago Congestive heart failure, unspecified HF chronicity, unspecified heart failure type   [place] [place], Megan P, DO   2 weeks ago Chronic intractable headache, unspecified headache type   [facility] [place], Megan P, DO   1 month ago Chronic intractable headache, unspecified headache type   [facility] [place], Megan P, DO   1 month ago Chronic intractable headache, unspecified headache type   [place] [facility] Mecum, Erin E, PA-C   8 months ago Primary hypertension   [facility] [place], Oralia Rud, DO       Future Appointments             In 2 weeks Laural Benes, Oralia Rud, DO [place] [facility], [facility]

## 2022-05-07 NOTE — Telephone Encounter (Signed)
Medication Refill - Medication: gabapentin (NEURONTIN) 300 MG capsule   Has the patient contacted their pharmacy? Yes.   Pharmacy stated the script has to be written different as her insurance will not cover the original script of 300mg  3x a day  Preferred Pharmacy (with phone number or street name):  [id number], [place] - [address] Phone: [phone]  Fax: [phone]     Has the patient been seen for an appointment in the last year OR does the patient have an upcoming appointment? Yes.    Agent: Please be advised that RX refills may take up to 3 business days. We ask that you follow-up with your pharmacy.  Please contact pharmacy as patient stated she took her last pill lastnight

## 2022-05-07 NOTE — Telephone Encounter (Signed)
  Chief Complaint: Pain from surgical site from yesterday Symptoms: Pain Frequency: today Pertinent Negatives: Patient denies  Disposition: [] ED /[] Urgent Care (no appt availability in office) / [x] Appointment(In office/virtual)/ []  [facility]/ [] Home Care/ [] Refused Recommended Disposition /[]  Mobile Bus/ []  Follow-up with PCP Additional Notes: PT had a surgical procedure yesterday and is in intense pain today. Pt had a portion of an artery removed from the left side of her head. Pt was not given pain medication for this. Pt has been taking Tylenol and gabapentin for pain w/o relief.  PT called surgeons office and was dismissed by staff.  PT has a pain medication given to her for HA. Pt cannot take it because it makes her sick.      Summary: pain on left side of head   Patient had a surgical procedure on yesterday. It was on the left side of her head and she in a lot of pain with no pain medication. She needs someone to call her back as she has tried to contact the surgeons office but no success.       Reason for Disposition  [1] SEVERE headache (e.g., excruciating) AND [2] not improved after 2 hours of pain medicine  Answer Assessment - Initial Assessment Questions 1. LOCATION: "Where does it hurt?"      Side of head from Sx procedure 2. ONSET: "When did the headache start?" (Minutes, hours or days)      Pain from procedure yesterday. 3. PATTERN: "Does the pain come and go, or has it been constant since it started?"     constant 4. SEVERITY: "How bad is the pain?" and "What does it keep you from doing?"  (e.g., Scale 1-10; mild, moderate, or severe)   - MILD (1-3): doesn't interfere with normal activities    - MODERATE (4-7): interferes with normal activities or awakens from sleep    - SEVERE (8-10): excruciating pain, unable to do any normal activities        10/10 5. RECURRENT SYMPTOM: "Have you ever had headaches before?" If Yes, ask: "When was the last  time?" and "What happened that time?"      no 6. CAUSE: "What do you think is causing the headache?"     Surgery  8. HEAD INJURY: "Has there been any recent injury to the head?"      Surgery on head 9. OTHER SYMPTOMS: "Do you have any other symptoms?" (fever, stiff neck, eye pain, sore throat, cold symptoms)  Protocols used: Headache-A-AH

## 2022-05-07 NOTE — Telephone Encounter (Signed)
Patient called and wanted to see if she can get some pain medication sent to her pharmacy ([facility]). She had temporal artery biopsy done yesterday. She has been alternating between tylenol and ibuprofen but its not helping.

## 2022-05-08 ENCOUNTER — Encounter: Payer: Medicare Other | Admitting: Surgery

## 2022-05-08 ENCOUNTER — Other Ambulatory Visit: Payer: Self-pay

## 2022-05-08 ENCOUNTER — Telehealth: Payer: Self-pay

## 2022-05-08 MED ORDER — IBUPROFEN 800 MG PO TABS: 800.0000 mg | ORAL_TABLET | Freq: Three times a day (TID) | ORAL | 0 refills | Status: DC | PRN

## 2022-05-08 NOTE — Telephone Encounter (Signed)
Ibuprofen 800 mg prescription has been sent into the patient's pharmacy. The patient is coming in to be seen today.

## 2022-05-08 NOTE — Telephone Encounter (Signed)
Call to patient to see when she would be here in clinic. The patient states that she thought she did not need to come in since Dr Everlene Farrier sent in a prescription for her Ibuprofen. The patient is unable to come in today due to not having a ride to the office. She denies any nausea or vomiting. Just some pain at her surgical site. Dr Everlene Farrier notified of this. Patient notified she may call with any further questions.

## 2022-05-10 DIAGNOSIS — R519 Headache, unspecified: Secondary | ICD-10-CM | POA: Diagnosis not present

## 2022-05-10 DIAGNOSIS — G8929 Other chronic pain: Secondary | ICD-10-CM | POA: Diagnosis not present

## 2022-05-10 DIAGNOSIS — M792 Neuralgia and neuritis, unspecified: Secondary | ICD-10-CM | POA: Diagnosis not present

## 2022-05-13 ENCOUNTER — Ambulatory Visit (INDEPENDENT_AMBULATORY_CARE_PROVIDER_SITE_OTHER): Payer: Medicare Other | Admitting: Surgery

## 2022-05-13 ENCOUNTER — Encounter: Payer: Self-pay | Admitting: Surgery

## 2022-05-13 VITALS — BP 176/83 | HR 71 | Temp 97.8°F | Ht 60.0 in | Wt 121.6 lb

## 2022-05-13 DIAGNOSIS — Z09 Encounter for follow-up examination after completed treatment for conditions other than malignant neoplasm: Secondary | ICD-10-CM

## 2022-05-13 DIAGNOSIS — R519 Headache, unspecified: Secondary | ICD-10-CM

## 2022-05-13 DIAGNOSIS — R6884 Jaw pain: Secondary | ICD-10-CM

## 2022-05-13 MED ORDER — TRAMADOL HCL 50 MG PO TABS: 50.0000 mg | ORAL_TABLET | Freq: Four times a day (QID) | ORAL | 0 refills | Status: DC | PRN

## 2022-05-13 NOTE — Patient Instructions (Signed)
If you have any concerns or questions, please feel free to call our office.   Temporal Artery Biopsy, Care After This sheet gives you information about how to care for yourself after your procedure. Your health care provider may also give you more specific instructions. If you have problems or questions, contact your health care provider. What can I expect after the procedure? After the procedure, it is common to have: Soreness. Bruising. Numbness. Swelling. Follow these instructions at home: Incision care  Follow instructions from your health care provider about how to take care of your incision. Make sure you: Wash your hands with soap and water before and after you change your bandage (dressing). If soap and water are not available, use hand sanitizer. Change your dressing as told by your health care provider. Leave stitches (sutures), skin glue, or adhesive strips in place. These skin closures may need to stay in place for 2 weeks or longer. If adhesive strip edges start to loosen and curl up, you may trim the loose edges. Do not remove adhesive strips completely unless your health care provider tells you to do that. Check your incision area every day for signs of infection. Check for: Redness, swelling, or pain. Fluid or blood. Warmth. Pus or a bad smell. Activity Do not do any physical work or strenuous exercise until your health care provider approves. Do not lift anything that is heavier than 10 lb (4.5 kg), or the limit that you are told, until your health care provider says that it is safe. General instructions  Take over-the-counter and prescription medicines only as told by your health care provider. Do not take aspirin or other NSAIDs unless your health care provider tells you to take them. Aspirin may increase your risk of bleeding at the incision site. Do not drive for 24 hours if you were given a sedative during your procedure. Follow your health care provider's  instructions on bathing or showering. Keep all follow-up visits as told by your health care provider. This is important. Contact a health care provider if: Medicine does not help your pain. You have chills. You have redness, swelling, or pain around your incision site. You have fluid or blood coming from your incision site. Your incision feels warm to the touch. You have pus or a bad smell coming from your incision site. You have a fever. You have nausea or vomiting. Get help right away if: You have bleeding from the incision that does not stop after 30 minutes of applying heavy pressure. You have chest pain. You have shortness of breath. You faint. You have sudden vision loss. You have weakness or drooping in your face or eye. Summary Follow instructions from your health care provider about how to take care of your incision. Do not do any physical work or strenuous exercise until your health care provider approves. Take over-the-counter and prescription medicines only as told by your health care provider. Contact a health care provider if you have signs of infection at your incision. Keep all follow-up visits as told by your health care provider. This is important. This information is not intended to replace advice given to you by your health care provider. Make sure you discuss any questions you have with your health care provider. Document Revised: 03/27/2020 Document Reviewed: 03/28/2020 Elsevier Patient Education  2023 [employer].

## 2022-05-13 NOTE — Progress Notes (Signed)
S/p left temporal artery biopsy.  Pathology discussed with patient in detail.  She continues to endorse headaches as well as mandibular pain.  Her incision is clean dry intact.  There is very mild ecchymosis on the left preauricular area.  There is no expanding hematomas.  No evidence of infection   A/p no evidence of complications related to temporal artery biopsy.  She does have pain that is persistent.  Please note that she has chronic pain issues.  I will do a one-time only tramadol prescription.  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  I will not be able to manage her chronic pain and encouraged her to also discuss with primary care regarding chronic pain issues.  We will see her on a as needed basis

## 2022-05-21 ENCOUNTER — Ambulatory Visit (INDEPENDENT_AMBULATORY_CARE_PROVIDER_SITE_OTHER): Payer: Medicare Other | Admitting: Family Medicine

## 2022-05-21 ENCOUNTER — Encounter: Payer: Self-pay | Admitting: Family Medicine

## 2022-05-21 VITALS — BP 163/82 | HR 66 | Temp 98.3°F | Ht 60.0 in | Wt 123.4 lb

## 2022-05-21 DIAGNOSIS — R519 Headache, unspecified: Secondary | ICD-10-CM | POA: Diagnosis not present

## 2022-05-21 DIAGNOSIS — G8929 Other chronic pain: Secondary | ICD-10-CM | POA: Diagnosis not present

## 2022-05-21 MED ORDER — PROPRANOLOL HCL ER 80 MG PO CP24: 80.0000 mg | ORAL_CAPSULE | Freq: Every day | ORAL | 3 refills | Status: DC

## 2022-05-21 NOTE — Progress Notes (Signed)
BP (!) 163/82 (BP Location: Left Arm, Cuff Size: Normal)   Pulse 66   Temp 98.3 F (36.8 C) (Oral)   Ht 5' (1.524 m)   Wt 123 lb 6.4 oz (56 kg)   SpO2 97%   BMI 24.10 kg/m    Subjective:    Patient ID: Casey Summers, female    DOB: December 29, 1945, 77 y.o.   MRN: [id number]  HPI: Casey Summers is a 77 y.o. female  Chief Complaint  Patient presents with   Headache    Patient says she is still having headaches since her surgery. Patient says she was seen by the Neurosurgeon and was prescribed Tramadol and says it helps her.    Still not feeling well. She notes that she continues with bad headaches. She saw neurology about 2 weeks ago and they cut down her gabapentin and gave her a taper of steroids. She is not due to follow up with them until July. She notes that her neurologist is moving to [place] so she will follow up with one of her colleagues. She notes that her heat has been doing a little better, she is not in as much pain as she was, but is still not feeling well and pain is still an "8/10". It's still aching and sharp and in her temple and into her jaw. TA biopsy was negative for TA. She has been taking her emgality, although she is unclear as to what dose she has been taking and she is also unclear as to how long she has been taking it. She doesn't think she has been taking her effexor or her propranolol. No other concerns or complaints at this time.   Relevant past medical, surgical, family and social history reviewed and updated as indicated. Interim medical history since our last visit reviewed. Allergies and medications reviewed and updated.  Review of Systems  Constitutional: Negative.   Respiratory: Negative.    Cardiovascular: Negative.   Gastrointestinal: Negative.   Musculoskeletal: Negative.   Neurological:  Positive for headaches. Negative for dizziness, tremors, seizures, syncope, facial asymmetry, speech difficulty, weakness, light-headedness and numbness.   Psychiatric/Behavioral: Negative.      Per HPI unless specifically indicated above     Objective:    BP (!) 163/82 (BP Location: Left Arm, Cuff Size: Normal)   Pulse 66   Temp 98.3 F (36.8 C) (Oral)   Ht 5' (1.524 m)   Wt 123 lb 6.4 oz (56 kg)   SpO2 97%   BMI 24.10 kg/m   Wt Readings from Last 3 Encounters:  05/21/22 123 lb 6.4 oz (56 kg)  05/13/22 121 lb 9.6 oz (55.2 kg)  05/06/22 124 lb (56.2 kg)    Physical Exam Vitals and nursing note reviewed.  Constitutional:      General: She is not in acute distress.    Appearance: Normal appearance. She is well-developed and normal weight. She is not ill-appearing, toxic-appearing or diaphoretic.  HENT:     Head: Normocephalic and atraumatic.     Right Ear: External ear normal.     Left Ear: External ear normal.     Nose: Nose normal.     Mouth/Throat:     Mouth: Mucous membranes are moist.     Pharynx: Oropharynx is clear.  Eyes:     General: No scleral icterus.       Right eye: No discharge.        Left eye: No discharge.     Extraocular Movements:  Extraocular movements intact.     Conjunctiva/sclera: Conjunctivae normal.     Pupils: Pupils are equal, round, and reactive to light.  Cardiovascular:     Rate and Rhythm: Normal rate and regular rhythm.     Pulses: Normal pulses.     Heart sounds: Normal heart sounds. No murmur heard.    No friction rub. No gallop.  Pulmonary:     Effort: Pulmonary effort is normal. No respiratory distress.     Breath sounds: Normal breath sounds. No stridor. No wheezing, rhonchi or rales.  Chest:     Chest wall: No tenderness.  Musculoskeletal:        General: Normal range of motion.     Cervical back: Normal range of motion and neck supple.  Skin:    General: Skin is warm and dry.     Capillary Refill: Capillary refill takes less than 2 seconds.     Coloration: Skin is not jaundiced or pale.     Findings: No bruising, erythema, lesion or rash.  Neurological:     General: No  focal deficit present.     Mental Status: She is alert and oriented to person, place, and time. Mental status is at baseline.  Psychiatric:        Mood and Affect: Mood normal.        Behavior: Behavior normal.        Thought Content: Thought content normal.        Judgment: Judgment normal.     Results for orders placed or performed in visit on 04/30/22  CBC with Differential/Platelet  Result Value Ref Range   WBC 5.5 3.4 - 10.8 x10E3/uL   RBC 3.49 (L) 3.77 - 5.28 x10E6/uL   Hemoglobin 12.1 11.1 - 15.9 g/dL   Hematocrit 86.5 78.4 - 46.6 %   MCV 103 (H) 79 - 97 fL   MCH 34.7 (H) 26.6 - 33.0 pg   MCHC 33.5 31.5 - 35.7 g/dL   RDW 69.6 29.5 - 28.4 %   Platelets 223 150 - 450 x10E3/uL   Neutrophils 72 Not Estab. %   Lymphs 9 Not Estab. %   Monocytes 14 Not Estab. %   Eos 3 Not Estab. %   Basos 1 Not Estab. %   Neutrophils Absolute 4.0 1.4 - 7.0 x10E3/uL   Lymphocytes Absolute 0.5 (L) 0.7 - 3.1 x10E3/uL   Monocytes Absolute 0.8 0.1 - 0.9 x10E3/uL   EOS (ABSOLUTE) 0.2 0.0 - 0.4 x10E3/uL   Basophils Absolute 0.0 0.0 - 0.2 x10E3/uL   Immature Granulocytes 1 Not Estab. %   Immature Grans (Abs) 0.0 0.0 - 0.1 x10E3/uL  Comprehensive metabolic panel  Result Value Ref Range   Glucose 81 70 - 99 mg/dL   BUN 10 8 - 27 mg/dL   Creatinine, Ser 1.32 0.57 - 1.00 mg/dL   eGFR 89 >44 WN/UUV/2.53   BUN/Creatinine Ratio 14 12 - 28   Sodium 134 134 - 144 mmol/L   Potassium 4.1 3.5 - 5.2 mmol/L   Chloride 97 96 - 106 mmol/L   CO2 20 20 - 29 mmol/L   Calcium 9.3 8.7 - 10.3 mg/dL   Total Protein 6.3 6.0 - 8.5 g/dL   Albumin 3.9 3.8 - 4.8 g/dL   Globulin, Total 2.4 1.5 - 4.5 g/dL   Albumin/Globulin Ratio 1.6 1.2 - 2.2   Bilirubin Total 0.4 0.0 - 1.2 mg/dL   Alkaline Phosphatase 200 (H) 44 - 121 IU/L   AST 32 0 -  40 IU/L   ALT 67 (H) 0 - 32 IU/L      Assessment & Plan:   Problem List Items Addressed This Visit       Other   Chronic intractable headache - Primary    She is unclear  what medication she is taking. Will get pharmacy involved and make sure she brings in all medications next time she comes in. Her neurologist is moving to [place]. Will get her a 2nd opinion with another neurologist. She does not think she has been taking her propranolol. With BP being elevated and headaches still uncontrolled, will get her back on the propranolol and recheck in about a month. Has not been taking venlafaxine- will avoid it now, but may need in the future.       Relevant Medications   EMGALITY, 300 MG DOSE, 100 MG/ML SOSY   propranolol ER (INDERAL LA) 80 MG 24 hr capsule   Other Relevant Orders   Ambulatory referral to Neurology   AMB Referral to Pharmacy Medication Management     Follow up plan: Return in about 3 weeks (around 06/11/2022).

## 2022-05-21 NOTE — Assessment & Plan Note (Signed)
She is unclear what medication she is taking. Will get pharmacy involved and make sure she brings in all medications next time she comes in. Her neurologist is moving to [place]. Will get her a 2nd opinion with another neurologist. She does not think she has been taking her propranolol. With BP being elevated and headaches still uncontrolled, will get her back on the propranolol and recheck in about a month. Has not been taking venlafaxine- will avoid it now, but may need in the future.

## 2022-05-22 DIAGNOSIS — R0602 Shortness of breath: Secondary | ICD-10-CM | POA: Diagnosis not present

## 2022-05-22 DIAGNOSIS — I10 Essential (primary) hypertension: Secondary | ICD-10-CM | POA: Diagnosis not present

## 2022-05-22 DIAGNOSIS — R0609 Other forms of dyspnea: Secondary | ICD-10-CM | POA: Diagnosis not present

## 2022-05-22 DIAGNOSIS — R7989 Other specified abnormal findings of blood chemistry: Secondary | ICD-10-CM | POA: Diagnosis not present

## 2022-05-22 DIAGNOSIS — I5032 Chronic diastolic (congestive) heart failure: Secondary | ICD-10-CM | POA: Diagnosis not present

## 2022-05-24 ENCOUNTER — Ambulatory Visit: Payer: Self-pay

## 2022-05-24 ENCOUNTER — Ambulatory Visit: Payer: Medicare Other | Admitting: Family Medicine

## 2022-05-24 ENCOUNTER — Telehealth: Payer: Self-pay

## 2022-05-24 ENCOUNTER — Ambulatory Visit: Payer: Self-pay | Admitting: *Deleted

## 2022-05-24 NOTE — Telephone Encounter (Signed)
  Chief Complaint: Tongue has sores on it and her throat is raw.   Unable to make it to her 3:40 appt today.   No transportation. Symptoms: Above.   Can swallow water and eat but everything stings and burns. Frequency: Now Pertinent Negatives: Patient denies difficulty swallow or shortness of breath or chest pain.   It hurts to swallow and eat but she can.   She is drinking a lot of water. Disposition: [] ED /[] Urgent Care (no appt availability in office) / [] Appointment(In office/virtual)/ []  [facility]/ [] Home Care/ [] Refused Recommended Disposition /[] [place] [facility]/ [x]  Follow-up with PCP Additional Notes: Cancelled her 3:40 appt. And called into [facility] and spoke with Herbert Seta letting her know pt was not going to make it to appt with Rashelle Pearley today.    No transportation so appt not rescheduled. Instructions given for when to call 911.

## 2022-05-24 NOTE — Telephone Encounter (Signed)
  Chief Complaint: mouth pain Symptoms: tongue redness and burning on L side, sore throat, feels like blisters on tongue Frequency: several days Pertinent Negatives: NA Disposition: [] ED /[] Urgent Care (no appt availability in office) / [x] Appointment(In office/virtual)/ []  [facility]/ [] Home Care/ [] Refused Recommended Disposition /[] [facility]/ []  Follow-up with PCP Additional Notes: pt states her tongue feels like on fire and bright red. Asked pt if on any abx. Pt states yes for possible ear infection. Has 1 day remaining of abx but didn't see on file. Scheduled pt OV today at 1540 with Rashelle, NP.   Reason for Disposition  Red patch in mouth or on tongue  Answer Assessment - Initial Assessment Questions 1. SYMPTOM: "What's the main symptom you're concerned about?" (e.g., chapped lips, dry mouth, lump, sores)     Tongue redness and burning on L side 2. ONSET: "When did the  sx  start?"     Several days 3. PAIN: "Is there any pain?" If Yes, ask: "How bad is it?" (Scale: 1-10; mild, moderate, severe)   - MILD (1-3):  doesn't interfere with eating or normal activities   - MODERATE (4-7): interferes with eating some solids and normal activities   - SEVERE (8-10):  excruciating pain, interferes with most normal activities   - SEVERE DYSPHAGIA: can't swallow liquids, drooling     moderate 5. OTHER SYMPTOMS: "Do you have any other symptoms?" (e.g., fever, sore throat, toothache, swelling)     Sore throat  Protocols used: Mouth Symptoms-A-AH

## 2022-05-24 NOTE — Progress Notes (Signed)
   Care Guide Note  05/24/2022 Name: Casey Summers MRN: [id number] DOB: 03/22/1945  Referred by: Dorcas Carrow, DO Reason for referral : Care Coordination (Outreach to schedule with pharm d )   [name] is a 77 y.o. year old female who is a primary care patient of Dorcas Carrow, DO. [name] was referred to the pharmacist for assistance related to HTN.    Successful contact was made with the patient to discuss pharmacy services including being ready for the pharmacist to call at least 5 minutes before the scheduled appointment time, to have medication bottles and any blood sugar or blood pressure readings ready for review. The patient agreed to meet with the pharmacist via with the pharmacist via telephone visit on (date/time).  05/28/2022  [name], RMA Care Guide [facility]  [place], [place] [address] Direct Dial: [phone] [email or web address]

## 2022-05-24 NOTE — Telephone Encounter (Signed)
  Reason for Disposition  [1] SEVERE mouth pain (e.g., excruciating) AND [2] not improved after 2 hours of pain medicine    Had appt for today but no transportation to get in.   Instructed to call 911 if she gets in distress.  Answer Assessment - Initial Assessment Questions 1. ONSET: "When did the mouth start hurting?" (e.g., hours or days ago)      She has sores on her tongue and swelling.   Had an appt for today at 3:40 but unable to make it due to her son not making it home in time to take her.   No other transportation. 2. SEVERITY: "How bad is the pain?" (Scale 1-10; mild, moderate or severe)   - MILD (1-3):  doesn't interfere with eating or normal activities   - MODERATE (4-7): interferes with eating some solids and normal activities   - SEVERE (8-10):  excruciating pain, interferes with most normal activities   - SEVERE DYSPHAGIA: can't swallow liquids, drooling     Moderate.   Can swallow and eat but it burns 3. SORES: "Are there any sores or ulcers in the mouth?" If Yes, ask: "What part of the mouth are the sores in?"     Yes on both sides of her tongue. 4. FEVER: "Do you have a fever?" If Yes, ask: "What is your temperature, how was it measured, and when did it start?"     No 5. CAUSE: "What do you think is causing the mouth pain?"     I don't know     Never had this happen before. 6. OTHER SYMPTOMS: "Do you have any other symptoms?" (e.g., difficulty breathing)     My throat is raw.   Hurts to swallow and my left ear is hot and hurting.    I instructed her to call 911 if she felt her throat starting to swell closed, developed chest pain, shortness of breath or difficulty swallowing.    She's drinking a lot of water without difficulty.   "I'm trying to flush my system out".    (See tongue protocol also)  Protocols used: Mouth Pain-A-AH

## 2022-05-28 ENCOUNTER — Other Ambulatory Visit: Payer: Medicare Other

## 2022-05-28 NOTE — Progress Notes (Signed)
05/28/2022 Name: Casey Summers MRN: [id number] DOB: 1945-06-16  Chief Complaint  Patient presents with   Medication Management   Casey Summers is a 77 y.o. year old female who presented for a telephone visit.   They were referred to the pharmacist by their PCP for assistance in managing complex medication management.   Patient is participating in a Managed Medicaid Plan:  No  Subjective: Telephone visit to verify medication list after recent visit with Dr. Laural Benes Care Team: Primary Care Provider: Dorcas Carrow, DO ; Next Scheduled Visit: 06/11/22  Medication Access/Adherence [facility] [address] [place] [facility] [address] [place] [address] Phone: [phone] Fax: [phone]  Patient reports affordability concerns with their medications: No  Patient reports access/transportation concerns to their pharmacy: No  Patient reports adherence concerns with their medications:  No    Hypertension: Current medications: losartan 50mg  daily, furosemide 20mg  as needed  -Weighs herself daily, and has only needed furosemide twice this month based on directions for use -Patient has a validated, automated, upper arm home BP cuff but does not regularly monitor -Last clinic BP 163/82 on 05/21/22 - had not taken losartan before appointment -States she has losartan 100mg  tablets at home that she is currently taking one-half tablet of daily.  Dose was decreased from 100 to 50mg  during hospitalization.  Medication Management: Current adherence strategy: Uses chart of medications and dose times to check off daily -Verified and updated medication list to reflect regimen she is currently on -Emgality is prescribed by [facility] Neuro to take (3 syringes of ) once a month; verified with [facility] pharmacy that fills this for her.  She has not seen much effect thus far from the 2 doses she has had. -She IS currently taking venlafaxine 150mg   daily -Taking APAP 1,000mg  TID scheduled, and LFT's appear to be consistently elevated -Endorses she has only taken 1 dose of IBU 800mg  since this was prescribed after surgery -Rarely uses tramadol due to lack of efficacy -Gabapentin dose was decreased to 600mg  TID by Neuro -Taking Jardiance 10mg  daily for CHF and endorses affordability of medication -Patient reports Good adherence to medications  Objective: Lab Results  Component Value Date   CREATININE 0.71 04/30/2022   BUN 10 04/30/2022   NA 134 04/30/2022   K 4.1 04/30/2022   CL 97 04/30/2022   CO2 20 04/30/2022   Lab Results  Component Value Date   CHOL 207 (H) 04/03/2022   HDL 94 04/03/2022   LDLCALC 99 04/03/2022   TRIG 78 04/03/2022   CHOLHDL 2.9 09/09/2017   Assessment/Plan:   Hypertension: - Currently uncontrolled - Recommended that patient check BP and record at least once a week - If BP consistently >130/80, recommend increasing losartan back to 100mg  daily  Medication Management: - Currently strategy sufficient to maintain appropriate adherence to prescribed medication regimen - Recommended patient replace today and tomorrow's APAP dose with 800mg  IBU TID to see if this makes a difference in intractable headache pain.  If no worse, I recommend continued use of IBU versus APAP based on liver function.  If HA pain worse, switch back to APAP but try 500mg  TID instead of 1000mg  TID. - Recommend f/u CMP and LFT labs beginning of June if not already done by outside providers.  Follow Up Plan: Telephone visit in 1 week to check on home BP, IBU versus APAP, and verify patient understands Emgality dosing to continue at 3 syringes  per week  Lenna Gilford, PharmD, DPLA

## 2022-05-31 DIAGNOSIS — R7989 Other specified abnormal findings of blood chemistry: Secondary | ICD-10-CM | POA: Diagnosis not present

## 2022-05-31 DIAGNOSIS — I10 Essential (primary) hypertension: Secondary | ICD-10-CM | POA: Diagnosis not present

## 2022-05-31 DIAGNOSIS — R0609 Other forms of dyspnea: Secondary | ICD-10-CM | POA: Diagnosis not present

## 2022-06-04 ENCOUNTER — Other Ambulatory Visit: Payer: Medicare Other

## 2022-06-04 NOTE — Progress Notes (Signed)
   06/04/2022  Patient ID: Casey Summers, female   DOB: 1945/03/28, 77 y.o.   MRN: [id number]  Subjective/Objective: Telephone follow up to see if patient has been checking BP at home and see effect of replacing routine APAP with IBU  HTN -Not checking home bp but had stress test last week, and average BP was 125/72  Chronic Intractable Headache -Based on elevated liver enzymes, I had suggested patient replace IBU with APAP for a couple of days; and if effective, use in place of or alternative  -Patient has solely been using IBU and just as effective for her-only has 8 tabs left and doesn't see Dr. Laural Benes for another week or [facility] neuro until July  -Had 4th dose of Emgality 5/1 and does not feel like it is beneficial   Assessment/Plan:  HTN -Currently controlled -Continue current regimen and routine follow-up -Check at home and record at least 1-2x/week; if consistently >130/80, contact Dr. Henriette Combs office  Chronic Intractable Headache  -Suggested alternating APAP with IBU to make IBU 800mg  tablets last until follow-up with Dr. Laural Benes 5/14 -Recommend f/u LFT's and CMP to determine treatment plan moving forward; don't want to further compromise liver function with APAP but also do not want to injure kidneys/increase BP with IBU -Could be appropriate to continue to alternate therapies -Benefits from [facility] typically seen within the first month of use; unsure efficacy for this patient; and I have concern it will get her into her donut hole with Medicare  Follow-up:  None scheduled at this time but can follow-up as needed per Dr. Fannie Knee, PharmD, DPLA

## 2022-06-06 ENCOUNTER — Other Ambulatory Visit: Payer: Self-pay

## 2022-06-06 MED ORDER — IBUPROFEN 800 MG PO TABS: 800.0000 mg | ORAL_TABLET | Freq: Three times a day (TID) | ORAL | 0 refills | Status: DC | PRN

## 2022-06-06 NOTE — Progress Notes (Signed)
   06/06/2022  Patient ID: Casey Summers, female   DOB: 15-Jan-1946, 77 y.o.   MRN: [id number]  Patient called requesting refill of Ibuprofen 800mg  TID to get her through until she sees Dr. Laural Benes 5/14.  States she started alternating with APAP, to make supply she had last; and the IBU is much more effective and long-lasting for her headache pain.  I did reiterate this medication comes with it's own concerns in regard to kidney function versus liver with APAP.    Pending script for 7 day supply for Dr. Laural Benes to sign if in agreement.  Recommend CMP at f/u appt next week.  Casey Summers, PharmD, DPLA

## 2022-06-07 DIAGNOSIS — R0609 Other forms of dyspnea: Secondary | ICD-10-CM | POA: Diagnosis not present

## 2022-06-07 DIAGNOSIS — I10 Essential (primary) hypertension: Secondary | ICD-10-CM | POA: Diagnosis not present

## 2022-06-07 DIAGNOSIS — I5033 Acute on chronic diastolic (congestive) heart failure: Secondary | ICD-10-CM | POA: Diagnosis not present

## 2022-06-11 ENCOUNTER — Ambulatory Visit (INDEPENDENT_AMBULATORY_CARE_PROVIDER_SITE_OTHER): Payer: Medicare Other | Admitting: Family Medicine

## 2022-06-11 ENCOUNTER — Encounter: Payer: Self-pay | Admitting: Family Medicine

## 2022-06-11 VITALS — BP 128/73 | HR 60 | Temp 98.0°F | Wt 123.3 lb

## 2022-06-11 DIAGNOSIS — R519 Headache, unspecified: Secondary | ICD-10-CM | POA: Diagnosis not present

## 2022-06-11 DIAGNOSIS — I10 Essential (primary) hypertension: Secondary | ICD-10-CM | POA: Diagnosis not present

## 2022-06-11 DIAGNOSIS — I509 Heart failure, unspecified: Secondary | ICD-10-CM | POA: Diagnosis not present

## 2022-06-11 DIAGNOSIS — G8929 Other chronic pain: Secondary | ICD-10-CM | POA: Diagnosis not present

## 2022-06-11 DIAGNOSIS — G500 Trigeminal neuralgia: Secondary | ICD-10-CM | POA: Diagnosis not present

## 2022-06-11 MED ORDER — FLUCONAZOLE 100 MG PO TABS
100.0000 mg | ORAL_TABLET | Freq: Every day | ORAL | 0 refills | Status: DC
Start: 2022-06-11 — End: 2022-12-03

## 2022-06-11 MED ORDER — FUROSEMIDE 20 MG PO TABS: ORAL_TABLET | ORAL | 0 refills | Status: DC

## 2022-06-11 MED ORDER — PROPRANOLOL HCL ER 80 MG PO CP24: 80.0000 mg | ORAL_CAPSULE | Freq: Every day | ORAL | 1 refills | Status: DC

## 2022-06-11 MED ORDER — EMPAGLIFLOZIN 10 MG PO TABS: 10.0000 mg | ORAL_TABLET | Freq: Every day | ORAL | 1 refills | Status: DC

## 2022-06-11 MED ORDER — VENLAFAXINE HCL ER 150 MG PO CP24: 150.0000 mg | ORAL_CAPSULE | Freq: Every day | ORAL | 1 refills | Status: DC

## 2022-06-11 MED ORDER — CARBAMAZEPINE ER 100 MG PO TB12: 100.0000 mg | ORAL_TABLET | Freq: Every day | ORAL | 1 refills | Status: DC

## 2022-06-11 MED ORDER — LOSARTAN POTASSIUM 50 MG PO TABS: 50.0000 mg | ORAL_TABLET | Freq: Every day | ORAL | 1 refills | Status: DC

## 2022-06-11 NOTE — Progress Notes (Signed)
BP 128/73   Pulse 60   Temp 98 F (36.7 C) (Oral)   Wt 123 lb 4.8 oz (55.9 kg)   SpO2 100%   BMI 24.08 kg/m    Subjective:    Patient ID: Casey Summers, female    DOB: 1945/08/30, 77 y.o.   MRN: [id number]  HPI: Casey Summers is a 77 y.o. female  Chief Complaint  Patient presents with   Headache   Casey Summers presents today for follow up on her headaches. She saw neurology. She notes that her head is still not feeling well. She doesn't think there has been any changes to how she's feeling.   HYPERTENSION  Hypertension status: controlled  Satisfied with current treatment? yes Duration of hypertension: chronic BP monitoring frequency:  not checking BP medication side effects:  no Medication compliance: excellent compliance Previous BP meds: propranolol, losartan, lasix Aspirin: no Recurrent headaches: no Visual changes: no Palpitations: no Dyspnea: no Chest pain: no Lower extremity edema: no Dizzy/lightheaded: no  Relevant past medical, surgical, family and social history reviewed and updated as indicated. Interim medical history since our last visit reviewed. Allergies and medications reviewed and updated.  Review of Systems  Constitutional: Negative.   Respiratory: Negative.    Cardiovascular: Negative.   Gastrointestinal: Negative.   Musculoskeletal: Negative.   Neurological:  Positive for headaches. Negative for dizziness, tremors, seizures, syncope, facial asymmetry, speech difficulty, weakness, light-headedness and numbness.  Psychiatric/Behavioral: Negative.      Per HPI unless specifically indicated above     Objective:    BP 128/73   Pulse 60   Temp 98 F (36.7 C) (Oral)   Wt 123 lb 4.8 oz (55.9 kg)   SpO2 100%   BMI 24.08 kg/m   Wt Readings from Last 3 Encounters:  06/11/22 123 lb 4.8 oz (55.9 kg)  05/21/22 123 lb 6.4 oz (56 kg)  05/13/22 121 lb 9.6 oz (55.2 kg)    Physical Exam Vitals and nursing note reviewed.  Constitutional:       General: She is not in acute distress.    Appearance: Normal appearance. She is not ill-appearing, toxic-appearing or diaphoretic.  HENT:     Head: Normocephalic and atraumatic.     Right Ear: External ear normal.     Left Ear: External ear normal.     Nose: Nose normal.     Mouth/Throat:     Mouth: Mucous membranes are moist.     Pharynx: Oropharynx is clear.  Eyes:     General: No scleral icterus.       Right eye: No discharge.        Left eye: No discharge.     Extraocular Movements: Extraocular movements intact.     Conjunctiva/sclera: Conjunctivae normal.     Pupils: Pupils are equal, round, and reactive to light.  Cardiovascular:     Rate and Rhythm: Normal rate and regular rhythm.     Pulses: Normal pulses.     Heart sounds: Normal heart sounds. No murmur heard.    No friction rub. No gallop.  Pulmonary:     Effort: Pulmonary effort is normal. No respiratory distress.     Breath sounds: Normal breath sounds. No stridor. No wheezing, rhonchi or rales.  Chest:     Chest wall: No tenderness.  Musculoskeletal:        General: Normal range of motion.     Cervical back: Normal range of motion and neck supple.  Skin:  General: Skin is warm and dry.     Capillary Refill: Capillary refill takes less than 2 seconds.     Coloration: Skin is not jaundiced or pale.     Findings: No bruising, erythema, lesion or rash.  Neurological:     General: No focal deficit present.     Mental Status: She is alert and oriented to person, place, and time. Mental status is at baseline.  Psychiatric:        Mood and Affect: Mood normal.        Behavior: Behavior normal.        Thought Content: Thought content normal.        Judgment: Judgment normal.     Results for orders placed or performed in visit on 06/11/22  Basic metabolic panel  Result Value Ref Range   Glucose 86 70 - 99 mg/dL   BUN 10 8 - 27 mg/dL   Creatinine, Ser 5.62 0.57 - 1.00 mg/dL   eGFR 91 >13 YQ/MVH/8.46    BUN/Creatinine Ratio 14 12 - 28   Sodium 130 (L) 134 - 144 mmol/L   Potassium 4.5 3.5 - 5.2 mmol/L   Chloride 91 (L) 96 - 106 mmol/L   CO2 22 20 - 29 mmol/L   Calcium 9.0 8.7 - 10.3 mg/dL      Assessment & Plan:   Problem List Items Addressed This Visit       Cardiovascular and Mediastinum   Hypertension    Under good control on current regimen. Continue current regimen. Continue to monitor. Call with any concerns. Refills given. Labs drawn today.        Relevant Medications   furosemide (LASIX) 20 MG tablet   losartan (COZAAR) 50 MG tablet   propranolol ER (INDERAL LA) 80 MG 24 hr capsule   Other Relevant Orders   Basic metabolic panel (Completed)   Congestive heart failure (HCC)    Euvolemic today. Under good control on current regimen. Continue current regimen. Continue to monitor. Call with any concerns. Continue to follow with cardiology.      Relevant Medications   furosemide (LASIX) 20 MG tablet   losartan (COZAAR) 50 MG tablet   propranolol ER (INDERAL LA) 80 MG 24 hr capsule   Other Relevant Orders   Basic metabolic panel (Completed)     Nervous and Auditory   Trigeminal neuralgia of left side of face - Primary    To see neurology in July. Await that appointment. Continue to monitor. Call with any concerns.       Relevant Medications   carbamazepine (TEGRETOL-XR) 100 MG 12 hr tablet   venlafaxine XR (EFFEXOR XR) 150 MG 24 hr capsule     Other   Chronic intractable headache    To see neurology in July. Await that appointment. Continue to monitor. Call with any concerns.       Relevant Medications   carbamazepine (TEGRETOL-XR) 100 MG 12 hr tablet   propranolol ER (INDERAL LA) 80 MG 24 hr capsule   venlafaxine XR (EFFEXOR XR) 150 MG 24 hr capsule     Follow up plan: Return in about 4 months (around 10/23/2022).

## 2022-06-12 LAB — BASIC METABOLIC PANEL
BUN/Creatinine Ratio: 14 (ref 12–28)
BUN: 10 mg/dL (ref 8–27)
CO2: 22 mmol/L (ref 20–29)
Calcium: 9 mg/dL (ref 8.7–10.3)
Chloride: 91 mmol/L — ABNORMAL LOW (ref 96–106)
Creatinine, Ser: 0.7 mg/dL (ref 0.57–1.00)
Glucose: 86 mg/dL (ref 70–99)
Potassium: 4.5 mmol/L (ref 3.5–5.2)
Sodium: 130 mmol/L — ABNORMAL LOW (ref 134–144)
eGFR: 91 mL/min/{1.73_m2} (ref >59)

## 2022-06-14 ENCOUNTER — Other Ambulatory Visit: Payer: Self-pay | Admitting: Family Medicine

## 2022-06-14 NOTE — Telephone Encounter (Signed)
Unable to refill per protocol, Rx request is too soon. Duplicate request.  Requested Prescriptions  Pending Prescriptions Disp Refills   ibuprofen (ADVIL) 800 MG tablet [Pharmacy Med Name: IBUPROFEN 800 MG TABLET] 21 tablet 0    Sig: TAKE 1 TABLET BY MOUTH EVERY 8 HOURS AS NEEDED     There is no refill protocol information for this order

## 2022-06-16 NOTE — Assessment & Plan Note (Signed)
To see neurology in July. Await that appointment. Continue to monitor. Call with any concerns.  

## 2022-06-16 NOTE — Assessment & Plan Note (Signed)
To see neurology in July. Await that appointment. Continue to monitor. Call with any concerns.

## 2022-06-16 NOTE — Assessment & Plan Note (Signed)
Euvolemic today. Under good control on current regimen. Continue current regimen. Continue to monitor. Call with any concerns. Continue to follow with cardiology.

## 2022-06-16 NOTE — Assessment & Plan Note (Signed)
Under good control on current regimen. Continue current regimen. Continue to monitor. Call with any concerns. Refills given. Labs drawn today.   

## 2022-06-17 ENCOUNTER — Ambulatory Visit: Payer: Self-pay | Admitting: *Deleted

## 2022-06-17 NOTE — Telephone Encounter (Signed)
That medicine was for a week- she should not be done with it and if she is still having symptoms that severe, we should probably see her. OK to book with Rashelle.

## 2022-06-17 NOTE — Telephone Encounter (Signed)
Called and scheduled her with another provider at 10:20 am on 06/17/2022.

## 2022-06-17 NOTE — Telephone Encounter (Signed)
    Summary: requesting Rx,blisters in her tongue and her private parts.   Pt stated she has used the medication fluconazole (DIFLUCAN) 100 MG tablet, and this did not work. She stated that she needs something stronger or some kind of mouth rinse. She mentioned that she cannot eat because it feels like razor blades when eating, and has blisters in her tongue and her private parts.  Seeking clinical advice.        Chief Complaint: medication request last OV 06/11/22 Symptoms: tip of tongue blisters, pain with eating. Tongue red back of throat very red/ black. "Feels like razor blades" with eating. Vaginal sx getting better but not gone. Continues with itching at times treated with "cream". Seen for same issues 06/11/22 Frequency: since 06/11/22 Pertinent Negatives: Patient denies fever. Can swallow  Disposition: [] ED /[] Urgent Care (no appt availability in office) / [] Appointment(In office/virtual)/ []  [facility]/ [] Home Care/ [] Refused Recommended Disposition /[] [facility]/ [x]  Follow-up with PCP Additional Notes:   Please advise if another medication can be prescribed for continued sx. Requesting mouth rinse or wash .  Last OV for same issues  Reason for Disposition  Prescription request for new medicine (not a refill)  Answer Assessment - Initial Assessment Questions 1. NAME of MEDICINE: "What medicine(s) are you calling about?"     Diflucan  2. QUESTION: "What is your question?" (e.g., double dose of medicine, side effect)     Diflucan 100mg  taken as prescribed and did not help sx of blisters on tongue or vaginal area go away . Can mouth rinse be prescribed? 3. PRESCRIBER: "Who prescribed the medicine?" Reason: if prescribed by specialist, call should be referred to that group.     PCP 4. SYMPTOMS: "Do you have any symptoms?" If Yes, ask: "What symptoms are you having?"  "How bad are the symptoms (e.g., mild, moderate, severe)     Yes tongue blisters tip of  tongue, mouth pain with eating . Vaginal sx getting better but not gone.  5. PREGNANCY:  "Is there any chance that you are pregnant?" "When was your last menstrual period?"     na  Protocols used: Medication Question Call-A-AH

## 2022-06-18 ENCOUNTER — Encounter: Payer: Self-pay | Admitting: Nurse Practitioner

## 2022-06-18 ENCOUNTER — Ambulatory Visit: Payer: Medicare Other | Admitting: Family Medicine

## 2022-06-18 ENCOUNTER — Ambulatory Visit (INDEPENDENT_AMBULATORY_CARE_PROVIDER_SITE_OTHER): Payer: Medicare Other | Admitting: Family Medicine

## 2022-06-18 VITALS — BP 175/81 | HR 71 | Temp 98.8°F | Wt 122.0 lb

## 2022-06-18 DIAGNOSIS — I10 Essential (primary) hypertension: Secondary | ICD-10-CM

## 2022-06-18 DIAGNOSIS — N760 Acute vaginitis: Secondary | ICD-10-CM | POA: Diagnosis not present

## 2022-06-18 DIAGNOSIS — K144 Atrophy of tongue papillae: Secondary | ICD-10-CM | POA: Diagnosis not present

## 2022-06-18 LAB — WET PREP FOR TRICH, YEAST, CLUE
Clue Cell Exam: NEGATIVE
Trichomonas Exam: NEGATIVE
Yeast Exam: NEGATIVE

## 2022-06-18 MED ORDER — LIDOCAINE VISCOUS HCL 2 % MT SOLN
5.0000 mL | OROMUCOSAL | 0 refills | Status: AC | PRN
Start: 2022-06-18 — End: 2022-07-02

## 2022-06-18 MED ORDER — LOSARTAN POTASSIUM 50 MG PO TABS
75.0000 mg | ORAL_TABLET | Freq: Every day | ORAL | 0 refills | Status: DC
Start: 2022-06-18 — End: 2022-09-05

## 2022-06-18 NOTE — Progress Notes (Signed)
Appointment moved to another provider

## 2022-06-18 NOTE — Patient Instructions (Signed)
Use mouth wash every 4 hours as needed Check BP at home and bring log to next visit

## 2022-06-18 NOTE — Progress Notes (Signed)
BP (!) 175/81   Pulse 71   Temp 98.8 F (37.1 C) (Oral)   Wt 122 lb (55.3 kg)   SpO2 97%   BMI 23.83 kg/m    Subjective:    Patient ID: Casey Summers, female    DOB: August 20, 1945, 77 y.o.   MRN: [id number]  HPI: [name] is a 77 y.o. female  Chief Complaint  Patient presents with   Mouth Blisters    Pt states she finished the medication prescribed last week and states it did not help. States she still has the mouth blisters and that they are still painful    BP elevated in office today. Initial BP 189/76, recheck 160/90 and 175/81. She decreased her Losartan from 100 mg to 50 mg in April after hospitalization recommendations.Denies lightheadedness or dizziness.   She has complaints of vaginal irritation, discharge, and itchiness She is currently using Vagisal, admits it helps with the itching.   TONGUE PAIN AND REDNESS She is complaining of tongue pain and redness for two weeks, with sensitivity to taste,affecting her ability to eat, burning sensation with seasoned foods, salt, and brushing her teeth. She was treated with Diflucan for 7 days, but her symptoms remain after finishing the course. She is also complaining of difficulty swallowing her medications due to the pain. She denies a change in her diet and sexual activity.   Duration: 2  weeks Location: tongue Pain:  yes Quality:  burning Severity: 5/10 Redness:  yes Swelling:  yes Warmth:  no Oozing:  no Pus:  no Treatments attempted: Antifungal Past similar infections:  no History of trauma in area:  no Fevers:  no Nausea/vomiting:  no    Relevant past medical, surgical, family and social history reviewed and updated as indicated. Interim medical history since our last visit reviewed. Allergies and medications reviewed and updated.  Review of Systems  Respiratory: Negative.    Cardiovascular: Negative.   Genitourinary:        Vaginal itchiness  Skin:        Erythematous painful tongue   Neurological:  Negative for dizziness, light-headedness and headaches.    Per HPI unless specifically indicated above     Objective:    BP (!) 175/81   Pulse 71   Temp 98.8 F (37.1 C) (Oral)   Wt 122 lb (55.3 kg)   SpO2 97%   BMI 23.83 kg/m   Wt Readings from Last 3 Encounters:  06/18/22 122 lb (55.3 kg)  06/11/22 123 lb 4.8 oz (55.9 kg)  05/21/22 123 lb 6.4 oz (56 kg)    Physical Exam Vitals and nursing note reviewed.  Constitutional:      General: She is awake. She is not in acute distress.    Appearance: Normal appearance. She is well-developed and well-groomed. She is not ill-appearing.  HENT:     Head: Normocephalic and atraumatic.     Right Ear: Hearing and external ear normal. No drainage.     Left Ear: Hearing and external ear normal. No drainage.     Nose: Nose normal.     Mouth/Throat:     Mouth: Mucous membranes are moist.     Comments: Erythematous tongue, constant pain Eyes:     General: Lids are normal.        Right eye: No discharge.        Left eye: No discharge.     Conjunctiva/sclera: Conjunctivae normal.  Cardiovascular:     Rate and Rhythm: Normal rate  and regular rhythm.     Heart sounds: Normal heart sounds, S1 normal and S2 normal. No murmur heard.    No gallop.  Pulmonary:     Effort: Pulmonary effort is normal. No accessory muscle usage or respiratory distress.     Breath sounds: Normal breath sounds.  Musculoskeletal:        General: Normal range of motion.     Cervical back: Full passive range of motion without pain and normal range of motion.     Right lower leg: No edema.     Left lower leg: No edema.  Skin:    General: Skin is warm and dry.     Capillary Refill: Capillary refill takes less than 2 seconds.  Neurological:     Mental Status: She is alert and oriented to person, place, and time.  Psychiatric:        Attention and Perception: Attention normal.        Mood and Affect: Mood normal.        Speech: Speech normal.         Behavior: Behavior normal. Behavior is cooperative.        Thought Content: Thought content normal.     Results for orders placed or performed in visit on 06/11/22  Basic metabolic panel  Result Value Ref Range   Glucose 86 70 - 99 mg/dL   BUN 10 8 - 27 mg/dL   Creatinine, Ser 1.91 0.57 - 1.00 mg/dL   eGFR 91 >47 WG/NFA/2.13   BUN/Creatinine Ratio 14 12 - 28   Sodium 130 (L) 134 - 144 mmol/L   Potassium 4.5 3.5 - 5.2 mmol/L   Chloride 91 (L) 96 - 106 mmol/L   CO2 22 20 - 29 mmol/L   Calcium 9.0 8.7 - 10.3 mg/dL      Assessment & Plan:   Problem List Items Addressed This Visit     Hypertension   Relevant Medications   losartan (COZAAR) 50 MG tablet   Other Visit Diagnoses     Atrophic glossitis    -  Primary   Acute, ongoing. CBC, folate, B12 done, will assess for nutritional deficiencies or anemia. Lidocaine mouth wash ordered Q4 hours daily for pain relief.   Relevant Medications   lidocaine (XYLOCAINE) 2 % solution   Other Relevant Orders   Vitamin B12   CBC With Diff/Platelet   Folate   Acute vaginitis       Acute, stable. Wet prep negative today. Follow up as needed.   Relevant Orders   WET PREP FOR TRICH, YEAST, CLUE        Follow up plan: Return in about 2 weeks (around 07/02/2022) for BP Recheck.

## 2022-06-18 NOTE — Progress Notes (Deleted)
   There were no vitals taken for this visit.   Subjective:    Patient ID: Casey Summers, female    DOB: 01/08/1946, 76 y.o.   MRN: [id number]  HPI: Casey Summers is a 77 y.o. female  No chief complaint on file.   Relevant past medical, surgical, family and social history reviewed and updated as indicated. Interim medical history since our last visit reviewed. Allergies and medications reviewed and updated.  Review of Systems  Per HPI unless specifically indicated above     Objective:    There were no vitals taken for this visit.  Wt Readings from Last 3 Encounters:  06/11/22 123 lb 4.8 oz (55.9 kg)  05/21/22 123 lb 6.4 oz (56 kg)  05/13/22 121 lb 9.6 oz (55.2 kg)    Physical Exam  Results for orders placed or performed in visit on 06/11/22  Basic metabolic panel  Result Value Ref Range   Glucose 86 70 - 99 mg/dL   BUN 10 8 - 27 mg/dL   Creatinine, Ser 7.84 0.57 - 1.00 mg/dL   eGFR 91 >69 GE/XBM/8.41   BUN/Creatinine Ratio 14 12 - 28   Sodium 130 (L) 134 - 144 mmol/L   Potassium 4.5 3.5 - 5.2 mmol/L   Chloride 91 (L) 96 - 106 mmol/L   CO2 22 20 - 29 mmol/L   Calcium 9.0 8.7 - 10.3 mg/dL      Assessment & Plan:   Problem List Items Addressed This Visit   None    Follow up plan: No follow-ups on file.

## 2022-06-19 LAB — CBC WITH DIFF/PLATELET
Basophils Absolute: 0 10*3/uL (ref 0.0–0.2)
Basos: 1 %
EOS (ABSOLUTE): 0.1 10*3/uL (ref 0.0–0.4)
Eos: 1 %
Hematocrit: 35 % (ref 34.0–46.6)
Hemoglobin: 12.4 g/dL (ref 11.1–15.9)
Immature Grans (Abs): 0.1 10*3/uL (ref 0.0–0.1)
Immature Granulocytes: 1 %
Lymphocytes Absolute: 1 10*3/uL (ref 0.7–3.1)
Lymphs: 18 %
MCH: 34.9 pg — ABNORMAL HIGH (ref 26.6–33.0)
MCHC: 35.4 g/dL (ref 31.5–35.7)
MCV: 99 fL — ABNORMAL HIGH (ref 79–97)
Monocytes Absolute: 0.6 10*3/uL (ref 0.1–0.9)
Monocytes: 10 %
Neutrophils Absolute: 3.9 10*3/uL (ref 1.4–7.0)
Neutrophils: 69 %
Platelets: 190 10*3/uL (ref 150–450)
RBC: 3.55 x10E6/uL — ABNORMAL LOW (ref 3.77–5.28)
RDW: 11.8 % (ref 11.7–15.4)
WBC: 5.7 10*3/uL (ref 3.4–10.8)

## 2022-06-19 LAB — VITAMIN B12: Vitamin B-12: 324 pg/mL (ref 232–1245)

## 2022-06-19 LAB — FOLATE: Folate: 4.9 ng/mL (ref >3.0)

## 2022-06-28 ENCOUNTER — Other Ambulatory Visit: Payer: Self-pay | Admitting: Family Medicine

## 2022-06-28 DIAGNOSIS — K144 Atrophy of tongue papillae: Secondary | ICD-10-CM

## 2022-06-28 NOTE — Telephone Encounter (Signed)
Requested medications are due for refill today.  Unsure  Requested medications are on the active medications list.  yes  Last refill. 06/18/2022  Future visit scheduled.   yes  Notes to clinic.  Please review for refill - medication not assigned a protocol.    Requested Prescriptions  Pending Prescriptions Disp Refills   lidocaine (XYLOCAINE) 2 % solution 100 mL 0    Sig: Use as directed 5 mLs in the mouth or throat every 4 (four) hours as needed for up to 14 days for mouth pain.     Off-Protocol Failed - 06/28/2022  2:15 PM      Failed - Medication not assigned to a protocol, review manually.      Passed - Valid encounter within last 12 months    Recent Outpatient Visits           1 week ago Atrophic glossitis   [place] [place], Sherran Needs, NP   2 weeks ago Trigeminal neuralgia of left side of face   [place] [facility] [place], Megan P, DO   1 month ago Chronic intractable headache, unspecified headache type   [place] [facility] [place], Megan P, DO   1 month ago Congestive heart failure, unspecified HF chronicity, unspecified heart failure type [facility])   [place] [facility] [place], Megan P, DO   2 months ago Chronic intractable headache, unspecified headache type   [place] [facility] [place], Arab, DO       Future Appointments             In 1 week [place], Sherran Needs, NP [facility]   In 3 months Laural Benes, Oralia Rud, DO [place] [facility], [facility]            Refused Prescriptions Disp Refills   empagliflozin (JARDIANCE) 10 MG TABS tablet 90 tablet 1    Sig: Take 1 tablet (10 mg total) by mouth daily.     Endocrinology:  Diabetes - SGLT2 Inhibitors Failed - 06/28/2022  2:15 PM      Failed - HBA1C is between 0 and 7.9 and within 180 days    No results found for: "HGBA1C", "LABA1C"       Passed - Cr in  normal range and within 360 days    Creatinine  Date Value Ref Range Status  02/22/2019 116.9 20.0 - 300.0 mg/dL Final   Creatinine, Ser  Date Value Ref Range Status  06/11/2022 0.70 0.57 - 1.00 mg/dL Final         Passed - eGFR in normal range and within 360 days    GFR calc Af Amer  Date Value Ref Range Status  10/12/2019 >60 >60 mL/min Final   GFR calc non Af Amer  Date Value Ref Range Status  10/12/2019 >60 >60 mL/min Final   eGFR  Date Value Ref Range Status  06/11/2022 91 >59 mL/min/1.73 Final         Passed - Valid encounter within last 6 months    Recent Outpatient Visits           1 week ago Atrophic glossitis   [place] [facility] [place], Sherran Needs, NP   2 weeks ago Trigeminal neuralgia of left side of face   [place] [facility] [place], Megan P, DO   1 month ago Chronic intractable headache, unspecified headache type   [place] [facility] [place], Megan P, DO   1 month ago Congestive heart failure, unspecified HF chronicity, unspecified heart failure type [facility])   [place] [facility] [place], Megan P, DO   2 months ago Chronic intractable headache, unspecified headache type   [place] [facility] Dorcas Carrow, DO       Future Appointments             In 1 week Pearley, Sherran Needs, NP [facility], [facility]   In 3 months Laural Benes, Oralia Rud, DO [place] [facility], [facility]

## 2022-06-28 NOTE — Telephone Encounter (Signed)
Requested Prescriptions  Pending Prescriptions Disp Refills   lidocaine (XYLOCAINE) 2 % solution 100 mL 0    Sig: Use as directed 5 mLs in the mouth or throat every 4 (four) hours as needed for up to 14 days for mouth pain.     Off-Protocol Failed - 06/28/2022  2:15 PM      Failed - Medication not assigned to a protocol, review manually.      Passed - Valid encounter within last 12 months    Recent Outpatient Visits           1 week ago Atrophic glossitis   [place] [facility] [place], Sherran Needs, NP   2 weeks ago Trigeminal neuralgia of left side of face   [place] [facility] [employer], Megan P, DO   1 month ago Chronic intractable headache, unspecified headache type   [place] [facility] [place], Megan P, DO   1 month ago Congestive heart failure, unspecified HF chronicity, unspecified heart failure type [facility])   [place] [facility] [place], Megan P, DO   2 months ago Chronic intractable headache, unspecified headache type   [place] [facility] [place], [place], DO       Future Appointments             In 1 week Pear[place], Sherran Needs, NP [place] [facility], [facility]   In 3 months Laural Benes, Oralia Rud, DO [place] [facility], [facility]            Refused Prescriptions Disp Refills   empagliflozin (JARDIANCE) 10 MG TABS tablet 90 tablet 1    Sig: Take 1 tablet (10 mg total) by mouth daily.     Endocrinology:  Diabetes - SGLT2 Inhibitors Failed - 06/28/2022  2:15 PM      Failed - HBA1C is between 0 and 7.9 and within 180 days    No results found for: "HGBA1C", "LABA1C"       Passed - Cr in normal range and within 360 days    Creatinine  Date Value Ref Range Status  02/22/2019 116.9 20.0 - 300.0 mg/dL Final   Creatinine, Ser  Date Value Ref Range Status  06/11/2022 0.70 0.57 - 1.00 mg/dL Final         Passed - eGFR in normal range and within 360 days     GFR calc Af Amer  Date Value Ref Range Status  10/12/2019 >60 >60 mL/min Final   GFR calc non Af Amer  Date Value Ref Range Status  10/12/2019 >60 >60 mL/min Final   eGFR  Date Value Ref Range Status  06/11/2022 91 >59 mL/min/1.73 Final         Passed - Valid encounter within last 6 months    Recent Outpatient Visits           1 week ago Atrophic glossitis   [place] [facility] [place], Sherran Needs, NP   2 weeks ago Trigeminal neuralgia of left side of face   [place] [facility] [place], Megan P, DO   1 month ago Chronic intractable headache, unspecified headache type   [place] [facility] [place], Megan P, DO   1 month ago Congestive heart failure, unspecified HF chronicity, unspecified heart failure type [facility])   [place] [facility] [place], Megan P, DO   2 months ago Chronic intractable headache, unspecified headache type   [place] [facility] Dorcas Carrow, DO       Future Appointments             In 1 week Pear[place], Sherran Needs, NP [place] [facility], [facility]   In 3 months Laural Benes, Oralia Rud, DO [place] [facility], [facility]

## 2022-06-28 NOTE — Telephone Encounter (Signed)
Medication Refill - Medication: lidocaine (XYLOCAINE) 2 % solution , empagliflozin (JARDIANCE) 10 MG TABS tablet ( Pt stated needs 90 days for insurance to cover.)  Has the patient contacted their pharmacy? Yes.    (Agent: If yes, when and what did the pharmacy advise?)  Preferred Pharmacy (with phone number or street name):  [facility] [phone] Dan Humphreys, [place] - [address]  [address] [place] [address]  Phone: [phone] Fax: [phone]  Hours: Not open 24 hours   Has the patient been seen for an appointment in the last year OR does the patient have an upcoming appointment? Yes.    Agent: Please be advised that RX refills may take up to 3 business days. We ask that you follow-up with your pharmacy.

## 2022-06-30 ENCOUNTER — Other Ambulatory Visit: Payer: Self-pay | Admitting: Physician Assistant

## 2022-06-30 DIAGNOSIS — K219 Gastro-esophageal reflux disease without esophagitis: Secondary | ICD-10-CM

## 2022-07-01 NOTE — Telephone Encounter (Signed)
Unable to refill per protocol, Rx request is too soon. Last refill 03/29/22 for 90 and 1 refill.  Requested Prescriptions  Pending Prescriptions Disp Refills   omeprazole (PRILOSEC) 20 MG capsule [Pharmacy Med Name: OMEPRAZOLE DR 20 MG CAPSULE] 90 capsule 1    Sig: TAKE 1 CAPSULE BY MOUTH EVERY DAY     Gastroenterology: Proton Pump Inhibitors Passed - 06/30/2022  1:47 AM      Passed - Valid encounter within last 12 months    Recent Outpatient Visits           1 week ago Atrophic glossitis   [place] [facility] [place], Sherran Needs, NP   2 weeks ago Trigeminal neuralgia of left side of face   [place] [facility] [place], Megan P, DO   1 month ago Chronic intractable headache, unspecified headache type   [place] [facility] [place], Megan P, DO   2 months ago Congestive heart failure, unspecified HF chronicity, unspecified heart failure type [facility])   [place] [facility] [place], Megan P, DO   2 months ago Chronic intractable headache, unspecified headache type   [place] [facility] Dorcas Carrow, DO       Future Appointments             In 1 week [place], Sherran Needs, NP [place] [facility]   In 3 months Laural Benes, Oralia Rud, DO [place] [facility], [facility]

## 2022-07-08 ENCOUNTER — Ambulatory Visit
Admission: RE | Admit: 2022-07-08 | Discharge: 2022-07-08 | Disposition: A | Discharge status: Home / Self Care | Payer: Medicare Other | Source: Ambulatory Visit | Attending: Family Medicine | Admitting: Family Medicine

## 2022-07-08 ENCOUNTER — Encounter: Payer: Self-pay | Admitting: Family Medicine

## 2022-07-08 ENCOUNTER — Ambulatory Visit (INDEPENDENT_AMBULATORY_CARE_PROVIDER_SITE_OTHER): Payer: Medicare Other | Admitting: Family Medicine

## 2022-07-08 VITALS — BP 119/70 | HR 69 | Temp 98.3°F | Wt 123.4 lb

## 2022-07-08 DIAGNOSIS — I6782 Cerebral ischemia: Secondary | ICD-10-CM | POA: Diagnosis not present

## 2022-07-08 DIAGNOSIS — R55 Syncope and collapse: Secondary | ICD-10-CM | POA: Diagnosis not present

## 2022-07-08 DIAGNOSIS — S0003XA Contusion of scalp, initial encounter: Secondary | ICD-10-CM | POA: Diagnosis not present

## 2022-07-08 DIAGNOSIS — M546 Pain in thoracic spine: Secondary | ICD-10-CM | POA: Insufficient documentation

## 2022-07-08 DIAGNOSIS — R519 Headache, unspecified: Secondary | ICD-10-CM | POA: Insufficient documentation

## 2022-07-08 DIAGNOSIS — G8929 Other chronic pain: Secondary | ICD-10-CM

## 2022-07-08 DIAGNOSIS — I10 Essential (primary) hypertension: Secondary | ICD-10-CM

## 2022-07-08 DIAGNOSIS — X58XXXA Exposure to other specified factors, initial encounter: Secondary | ICD-10-CM | POA: Insufficient documentation

## 2022-07-08 DIAGNOSIS — W19XXXA Unspecified fall, initial encounter: Secondary | ICD-10-CM | POA: Insufficient documentation

## 2022-07-08 DIAGNOSIS — K144 Atrophy of tongue papillae: Secondary | ICD-10-CM | POA: Diagnosis not present

## 2022-07-08 MED ORDER — LIDOCAINE VISCOUS HCL 2 % MT SOLN
15.0000 mL | OROMUCOSAL | 1 refills | Status: DC | PRN
Start: 2022-07-08 — End: 2023-10-13

## 2022-07-08 NOTE — Assessment & Plan Note (Signed)
Acute, ongoing. 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 Requesting Tramadol refill, recommend following up with surgeon or [facility] for continued pain management. Recommend continued use of Ibuprofen and Gabapentin.

## 2022-07-08 NOTE — Patient Instructions (Addendum)
Continue using Lidocaine mouthwash Continue taking BP medications daily Check BP x2-3 times weekly and write down recordings   For Headache: Continue Ibuprofen, Tegretol, and Gabapentin  For Fall: Obtain Head CT and Back Xray, use ibuprofen for pain, continue use of lidocaine patches for pain.

## 2022-07-08 NOTE — Assessment & Plan Note (Addendum)
Chronic, stable. BP 119/70. Recommend continue checking BP at home x2-3 weekly and bring recordings at next visit. Continue Losartan and Propranolol daily.

## 2022-07-08 NOTE — Progress Notes (Signed)
BP 119/70   Pulse 69   Temp 98.3 F (36.8 C) (Oral)   Wt 123 lb 6.4 oz (56 kg)   SpO2 97%   BMI 24.10 kg/m    Subjective:    Patient ID: Casey Summers, female    DOB: [birth year], 77 y.o.   MRN: [id number]  HPI: Casey Summers is a 77 y.o. female  Chief Complaint  Patient presents with   Hypertension     SYNCOPE EPISODE Last week on Wednesday and Thursday she had x 2 syncope episodes without loss of consciousness. She describes the fall as a loss of balance. She admits to light headedness. She hit her head on hard wood floor. She  complains of left upper back pain with movement, she is not able to wear bra d/t pain, affecting her ability to drive and is having a harder time getting in and out of the car. She has tried lidocaine patches for the back pain and this has helped. She has a bruise on her right upper arm and her left posterior head.    MOUTH PAIN Lidocaine mouthwash is helping, she can eat now. She admits her tongue is still red but it has improved. She is able to brush her teeth. Rates pain 2/10, which has greatly improved. She denies bleeding, drainage, white thrush, she is awakening with dry mouth in the mornings. Her water intake is low.   MIGRAINES She describes it as steady, she is taking Ibuprofren x3 daily 800mg , Gabapentin x3 daily, along with 25 mg Tramadol. She previously had a temporal artery biopsy and MD gave Tramadol for post op pain management, she also says Tramadol helped with headache. Her next appointment with [facility] is 08/21/22.  Duration: 11 months Onset: gradual Severity: 5/10 and sometimes 8/10 Quality: sharp and dull Frequency: constant Location: Left side and at top Headache duration: Radiation: no Time of day headache occurs:  Alleviating factors: Ibuprofen, x1 gabapentin, x1/2 tablet Tramadol (25 mg) Aggravating factors: None Headache status at time of visit: current headache Treatments attempted: Ibuprofen Aura:  no Nausea:  no Vomiting: no Photophobia:  no Phonophobia:  no Effect on social functioning:  Yes Confusion:  no Gait disturbance/ataxia:  no Behavioral changes:  no Fevers:  no   HYPERTENSION without Chronic Kidney Disease Patient is currently taking Losartan 75 mg daily, Propranolol 80 mg, and PRN Furosemide 20 mg for weight gain of 3-4 lbs over 1-3 days. She did not take her Losartan today. She has taken 3 lasix tablets last week and it helped with the swelling. She is weighing herself at home and weight has been consistent, she is up 1lb over two weeks.   Hypertension status: stable  Satisfied with current treatment? yes Duration of hypertension: chronic BP monitoring frequency:  rarely She checked her BP at [employer] 124/53 on Saturday (2 days ago), last week checked it and it was 138/79.  BP range: 125-138/50-79 BP medication side effects:  no Medication compliance: excellent compliance Aspirin: no Recurrent headaches: yes Visual changes: no Palpitations: no Dyspnea: no Chest pain: no Lower extremity edema: no was taking Lasix for one occurrence Dizzy/lightheaded: yes light headedness with fall  Relevant past medical, surgical, family and social history reviewed and updated as indicated. Interim medical history since our last visit reviewed. Allergies and medications reviewed and updated.  Review of Systems  Constitutional:  Negative for fever.  Eyes:  Negative for photophobia and visual disturbance.  Respiratory: Negative.    Cardiovascular: Negative.  Gastrointestinal:  Negative for nausea and vomiting.  Musculoskeletal:  Positive for back pain.  Skin:  Positive for color change.       Mild erythema of tongue, right upper arm bruise, and left posterior head bruise.   Neurological:  Positive for light-headedness and headaches. Negative for dizziness and weakness.  Psychiatric/Behavioral:  Negative for behavioral problems and confusion.     Per HPI unless  specifically indicated above     Objective:    BP 119/70   Pulse 69   Temp 98.3 F (36.8 C) (Oral)   Wt 123 lb 6.4 oz (56 kg)   SpO2 97%   BMI 24.10 kg/m   Wt Readings from Last 3 Encounters:  07/08/22 123 lb 6.4 oz (56 kg)  06/18/22 122 lb (55.3 kg)  06/11/22 123 lb 4.8 oz (55.9 kg)    Physical Exam Vitals and nursing note reviewed.  Constitutional:      General: She is awake. She is not in acute distress.    Appearance: Normal appearance. She is well-developed and well-groomed. She is not ill-appearing.  HENT:     Head: Normocephalic and atraumatic.     Right Ear: Hearing and external ear normal. No drainage.     Left Ear: Hearing and external ear normal. No drainage.     Nose: Nose normal.     Mouth/Throat:     Comments: Mild tongue erythema Eyes:     General: Lids are normal.        Right eye: No discharge.        Left eye: No discharge.     Conjunctiva/sclera: Conjunctivae normal.  Cardiovascular:     Rate and Rhythm: Normal rate and regular rhythm.     Pulses:          Radial pulses are 2+ on the right side and 2+ on the left side.     Heart sounds: Normal heart sounds, S1 normal and S2 normal. No murmur heard.    No gallop.  Pulmonary:     Effort: Pulmonary effort is normal. No accessory muscle usage or respiratory distress.     Breath sounds: Normal breath sounds.  Musculoskeletal:        General: Normal range of motion.     Cervical back: Full passive range of motion without pain and normal range of motion.     Right lower leg: No edema.     Left lower leg: No edema.  Skin:    General: Skin is warm and dry.     Capillary Refill: Capillary refill takes less than 2 seconds.     Findings: Bruising present. No laceration.          Comments: Right upper arm bruise Left posterior head bruise  Neurological:     Mental Status: She is alert and oriented to person, place, and time.     Cranial Nerves: Cranial nerves 2-12 are intact.     Sensory: Sensation is  intact.     Motor: Motor function is intact.     Coordination: Romberg sign negative.  Psychiatric:        Attention and Perception: Attention normal.        Mood and Affect: Mood normal.        Speech: Speech normal.        Behavior: Behavior normal. Behavior is cooperative.        Thought Content: Thought content normal.     Results for orders placed or performed in visit  on 06/18/22  WET PREP FOR TRICH, YEAST, CLUE   Specimen: Sterile Swab   Sterile Swab  Result Value Ref Range   Trichomonas Exam Negative Negative   Yeast Exam Negative Negative   Clue Cell Exam Negative Negative  Vitamin B12  Result Value Ref Range   Vitamin B-12 324 232 - 1,245 pg/mL  Folate  Result Value Ref Range   Folate 4.9 >3.0 ng/mL  CBC With Diff/Platelet  Result Value Ref Range   WBC 5.7 3.4 - 10.8 x10E3/uL   RBC 3.55 (L) 3.77 - 5.28 x10E6/uL   Hemoglobin 12.4 11.1 - 15.9 g/dL   Hematocrit 16.1 09.6 - 46.6 %   MCV 99 (H) 79 - 97 fL   MCH 34.9 (H) 26.6 - 33.0 pg   MCHC 35.4 31.5 - 35.7 g/dL   RDW 04.5 40.9 - 81.1 %   Platelets 190 150 - 450 x10E3/uL   Neutrophils 69 Not Estab. %   Lymphs 18 Not Estab. %   Monocytes 10 Not Estab. %   Eos 1 Not Estab. %   Basos 1 Not Estab. %   Neutrophils Absolute 3.9 1.4 - 7.0 x10E3/uL   Lymphocytes Absolute 1.0 0.7 - 3.1 x10E3/uL   Monocytes Absolute 0.6 0.1 - 0.9 x10E3/uL   EOS (ABSOLUTE) 0.1 0.0 - 0.4 x10E3/uL   Basophils Absolute 0.0 0.0 - 0.2 x10E3/uL   Immature Granulocytes 1 Not Estab. %   Immature Grans (Abs) 0.1 0.0 - 0.1 x10E3/uL      Assessment & Plan:   Problem List Items Addressed This Visit     Hypertension - Primary    Chronic, stable. BP 119/70. Recommend continue checking BP at home x2-3 weekly and bring recordings at next visit. Continue Losartan and Propranolol daily.       Chronic intractable headache    Acute, ongoing. 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 Requesting Tramadol refill, recommend following up with surgeon or [facility] for continued pain management. Recommend continued use of Ibuprofen and Gabapentin.       Relevant Orders   CT HEAD WO CONTRAST ( ) (Completed)   Fall    Acute, stable. Obtained STAT head CT and thoracic spine xray. Will treat based on results. Encouraged to change positions slowly.      Relevant Orders   CT HEAD WO CONTRAST ( ) (Completed)   DG Thoracic Spine 2 View   Atrophic glossitis    Acute, improving. Continue use of Lidocaine mouthwash, refill given today, Recommend increasing water intake.       Relevant Medications   lidocaine (XYLOCAINE) 2 % solution     Follow up plan: Return in about 4 weeks (around 08/05/2022) for BP Recheck.

## 2022-07-08 NOTE — Assessment & Plan Note (Addendum)
Acute, stable. Obtained STAT head CT and thoracic spine xray. Will treat based on results. Encouraged to change positions slowly.

## 2022-07-08 NOTE — Assessment & Plan Note (Signed)
Acute, improving. Continue use of Lidocaine mouthwash, refill given today, Recommend increasing water intake.

## 2022-07-11 ENCOUNTER — Other Ambulatory Visit: Payer: Self-pay | Admitting: Family Medicine

## 2022-07-11 DIAGNOSIS — I10 Essential (primary) hypertension: Secondary | ICD-10-CM

## 2022-07-11 NOTE — Telephone Encounter (Signed)
Copied from CRM [id number]. Topic: General - Other >> Jul 11, 2022  1:33 PM Dondra Prader E wrote: Reason for CRM: Pt called requesting to discuss her Xray results, please advise

## 2022-07-12 ENCOUNTER — Telehealth: Payer: Self-pay | Admitting: Family Medicine

## 2022-07-12 NOTE — Telephone Encounter (Signed)
Reached out to patient via MyChart. 

## 2022-07-12 NOTE — Telephone Encounter (Signed)
Copied from CRM [id number]. Topic: General - Inquiry >> Jul 12, 2022 11:47 AM Haroldine Laws wrote: Reason for CRM: pt called asking for the results from her xrays on Monday  CB#  [phone]

## 2022-08-21 DIAGNOSIS — M542 Cervicalgia: Secondary | ICD-10-CM | POA: Diagnosis not present

## 2022-08-21 DIAGNOSIS — G4486 Cervicogenic headache: Secondary | ICD-10-CM | POA: Diagnosis not present

## 2022-08-27 DIAGNOSIS — H25813 Combined forms of age-related cataract, bilateral: Secondary | ICD-10-CM | POA: Diagnosis not present

## 2022-08-30 DIAGNOSIS — M542 Cervicalgia: Secondary | ICD-10-CM | POA: Diagnosis not present

## 2022-08-30 DIAGNOSIS — M5412 Radiculopathy, cervical region: Secondary | ICD-10-CM | POA: Diagnosis not present

## 2022-09-05 ENCOUNTER — Ambulatory Visit (INDEPENDENT_AMBULATORY_CARE_PROVIDER_SITE_OTHER): Payer: Medicare Other | Admitting: Family Medicine

## 2022-09-05 ENCOUNTER — Encounter: Payer: Self-pay | Admitting: Family Medicine

## 2022-09-05 VITALS — BP 132/72 | HR 76 | Temp 97.2°F | Wt 122.0 lb

## 2022-09-05 DIAGNOSIS — I700 Atherosclerosis of aorta: Secondary | ICD-10-CM | POA: Diagnosis not present

## 2022-09-05 DIAGNOSIS — F3342 Major depressive disorder, recurrent, in full remission: Secondary | ICD-10-CM

## 2022-09-05 DIAGNOSIS — G500 Trigeminal neuralgia: Secondary | ICD-10-CM | POA: Diagnosis not present

## 2022-09-05 DIAGNOSIS — Z1231 Encounter for screening mammogram for malignant neoplasm of breast: Secondary | ICD-10-CM | POA: Diagnosis not present

## 2022-09-05 DIAGNOSIS — I509 Heart failure, unspecified: Secondary | ICD-10-CM

## 2022-09-05 DIAGNOSIS — I10 Essential (primary) hypertension: Secondary | ICD-10-CM

## 2022-09-05 MED ORDER — LOSARTAN POTASSIUM 50 MG PO TABS
75.0000 mg | ORAL_TABLET | Freq: Every day | ORAL | 0 refills | Status: DC
Start: 2022-09-05 — End: 2022-12-03

## 2022-09-05 NOTE — Progress Notes (Signed)
BP 132/72   Pulse 76   Temp (!) 97.2 F (36.2 C) (Oral)   Wt 122 lb (55.3 kg)   SpO2 96%   BMI 23.83 kg/m    Subjective:    Patient ID: Casey Summers, female    DOB: 1945/02/19, 77 y.o.   MRN: [id number]  HPI: MCKAY NOTZ is a 77 y.o. female  Chief Complaint  Patient presents with   Hypertension   HYPERTENSION  Hypertension status: uncontrolled  Satisfied with current treatment? yes Duration of hypertension: chronic BP monitoring frequency:  not checking BP medication side effects:  no Medication compliance: excellent compliance Previous BP meds:losartan, lasix, propranolol Aspirin: no Recurrent headaches: yes Visual changes: no Palpitations: no Dyspnea: no Chest pain: no Lower extremity edema: no Dizzy/lightheaded: no  DEPRESSION Mood status: controlled Satisfied with current treatment?: yes Symptom severity: mild  Duration of current treatment : chronic Side effects: no Medication compliance: excellent compliance Psychotherapy/counseling: no  Previous psychiatric medications: effexor Depressed mood: no Anxious mood: no Anhedonia: no Significant weight loss or gain: no Insomnia: no  Fatigue: no Feelings of worthlessness or guilt: no Impaired concentration/indecisiveness: no Suicidal ideations: no Hopelessness: no Crying spells: no    09/05/2022   10:02 AM 07/08/2022    1:07 PM 06/11/2022   10:49 AM 05/21/2022    8:26 AM 04/30/2022    4:04 PM  Depression screen PHQ 2/9  Decreased Interest 0 0 1 0 0  Down, Depressed, Hopeless 0 0 0 0 0  PHQ - 2 Score 0 0 1 0 0  Altered sleeping 0 3 2 3 1   Tired, decreased energy 0 0 0 2 1  Change in appetite 0 0 0 0 0  Feeling bad or failure about yourself  0 0 0 0 0  Trouble concentrating 0 0 0 0 0  Moving slowly or fidgety/restless 0 0 0 0 0  Suicidal thoughts 0 0 0 0 0  PHQ-9 Score 0 3 3 5 2   Difficult doing work/chores Not difficult at all Somewhat difficult Not difficult at all  Not difficult at  all     Relevant past medical, surgical, family and social history reviewed and updated as indicated. Interim medical history since our last visit reviewed. Allergies and medications reviewed and updated.  Review of Systems  Constitutional: Negative.   Respiratory: Negative.    Cardiovascular: Negative.   Gastrointestinal: Negative.   Musculoskeletal: Negative.   Psychiatric/Behavioral: Negative.      Per HPI unless specifically indicated above     Objective:    BP 132/72   Pulse 76   Temp (!) 97.2 F (36.2 C) (Oral)   Wt 122 lb (55.3 kg)   SpO2 96%   BMI 23.83 kg/m   Wt Readings from Last 3 Encounters:  09/05/22 122 lb (55.3 kg)  07/08/22 123 lb 6.4 oz (56 kg)  06/18/22 122 lb (55.3 kg)    Physical Exam Vitals and nursing note reviewed.  Constitutional:      General: She is not in acute distress.    Appearance: Normal appearance. She is not ill-appearing, toxic-appearing or diaphoretic.  HENT:     Head: Normocephalic and atraumatic.     Right Ear: External ear normal.     Left Ear: External ear normal.     Nose: Nose normal.     Mouth/Throat:     Mouth: Mucous membranes are moist.     Pharynx: Oropharynx is clear.  Eyes:     General: No  scleral icterus.       Right eye: No discharge.        Left eye: No discharge.     Extraocular Movements: Extraocular movements intact.     Conjunctiva/sclera: Conjunctivae normal.     Pupils: Pupils are equal, round, and reactive to light.  Cardiovascular:     Rate and Rhythm: Normal rate and regular rhythm.     Pulses: Normal pulses.     Heart sounds: Normal heart sounds. No murmur heard.    No friction rub. No gallop.  Pulmonary:     Effort: Pulmonary effort is normal. No respiratory distress.     Breath sounds: Normal breath sounds. No stridor. No wheezing, rhonchi or rales.  Chest:     Chest wall: No tenderness.  Musculoskeletal:        General: Normal range of motion.     Cervical back: Normal range of motion  and neck supple.  Skin:    General: Skin is warm and dry.     Capillary Refill: Capillary refill takes less than 2 seconds.     Coloration: Skin is not jaundiced or pale.     Findings: No bruising, erythema, lesion or rash.  Neurological:     General: No focal deficit present.     Mental Status: She is alert and oriented to person, place, and time. Mental status is at baseline.  Psychiatric:        Mood and Affect: Mood normal.        Behavior: Behavior normal.        Thought Content: Thought content normal.        Judgment: Judgment normal.     Results for orders placed or performed in visit on 06/18/22  WET PREP FOR TRICH, YEAST, CLUE   Specimen: Sterile Swab   Sterile Swab  Result Value Ref Range   Trichomonas Exam Negative Negative   Yeast Exam Negative Negative   Clue Cell Exam Negative Negative  Vitamin B12  Result Value Ref Range   Vitamin B-12 324 232 - 1,245 pg/mL  Folate  Result Value Ref Range   Folate 4.9 >3.0 ng/mL  CBC With Diff/Platelet  Result Value Ref Range   WBC 5.7 3.4 - 10.8 x10E3/uL   RBC 3.55 (L) 3.77 - 5.28 x10E6/uL   Hemoglobin 12.4 11.1 - 15.9 g/dL   Hematocrit 16.1 09.6 - 46.6 %   MCV 99 (H) 79 - 97 fL   MCH 34.9 (H) 26.6 - 33.0 pg   MCHC 35.4 31.5 - 35.7 g/dL   RDW 04.5 40.9 - 81.1 %   Platelets 190 150 - 450 x10E3/uL   Neutrophils 69 Not Estab. %   Lymphs 18 Not Estab. %   Monocytes 10 Not Estab. %   Eos 1 Not Estab. %   Basos 1 Not Estab. %   Neutrophils Absolute 3.9 1.4 - 7.0 x10E3/uL   Lymphocytes Absolute 1.0 0.7 - 3.1 x10E3/uL   Monocytes Absolute 0.6 0.1 - 0.9 x10E3/uL   EOS (ABSOLUTE) 0.1 0.0 - 0.4 x10E3/uL   Basophils Absolute 0.0 0.0 - 0.2 x10E3/uL   Immature Granulocytes 1 Not Estab. %   Immature Grans (Abs) 0.1 0.0 - 0.1 x10E3/uL      Assessment & Plan:   Problem List Items Addressed This Visit       Cardiovascular and Mediastinum   Hypertension    Better on recheck. Continue current regimen. Continue to monitor.  Call with any concerns. Refills given today.  Relevant Medications   losartan (COZAAR) 50 MG tablet   Aortic atherosclerosis (HCC) - Primary    Will keep BP and cholesterol under good control. Continue to monitor. Call with any concerns.       Relevant Medications   losartan (COZAAR) 50 MG tablet   Congestive heart failure (HCC)    Euvolemic today. Continue to follow with cardiology. Call with any concerns.       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 Continue current regimen. Continue to follow with neurology. Call with any concerns.       Relevant Medications   lamoTRIgine (LAMICTAL) 25 MG tablet     Other   Depression    Under good control on current regimen. Continue current regimen. Continue to monitor. Call with any concerns. Refills given.        Other Visit Diagnoses     Encounter for screening mammogram for malignant neoplasm of breast       Relevant Orders   MM 3D SCREENING MAMMOGRAM BILATERAL BREAST        Follow up plan: Return in about 2 months (around 11/05/2022).

## 2022-09-06 DIAGNOSIS — M542 Cervicalgia: Secondary | ICD-10-CM | POA: Diagnosis not present

## 2022-09-06 DIAGNOSIS — M5412 Radiculopathy, cervical region: Secondary | ICD-10-CM | POA: Diagnosis not present

## 2022-09-08 ENCOUNTER — Encounter: Payer: Self-pay | Admitting: Family Medicine

## 2022-09-08 NOTE — Assessment & Plan Note (Signed)
Under good control on current regimen. Continue current regimen. Continue to monitor. Call with any concerns. Refills given.   

## 2022-09-08 NOTE — Assessment & Plan Note (Signed)
Improving with neurology. Continue current regimen. Continue to follow with neurology. Call with any concerns.

## 2022-09-08 NOTE — Assessment & Plan Note (Signed)
Will keep BP and cholesterol under good control. Continue to monitor. Call with any concerns.  

## 2022-09-08 NOTE — Assessment & Plan Note (Signed)
Euvolemic today. Continue to follow with cardiology. Call with any concerns.  

## 2022-09-08 NOTE — Assessment & Plan Note (Addendum)
Better on recheck. Continue current regimen. Continue to monitor. Call with any concerns. Refills given today. 

## 2022-09-10 DIAGNOSIS — I10 Essential (primary) hypertension: Secondary | ICD-10-CM | POA: Diagnosis not present

## 2022-09-10 DIAGNOSIS — I5032 Chronic diastolic (congestive) heart failure: Secondary | ICD-10-CM | POA: Diagnosis not present

## 2022-09-10 DIAGNOSIS — R0602 Shortness of breath: Secondary | ICD-10-CM | POA: Diagnosis not present

## 2022-09-13 DIAGNOSIS — M542 Cervicalgia: Secondary | ICD-10-CM | POA: Diagnosis not present

## 2022-09-13 DIAGNOSIS — M5412 Radiculopathy, cervical region: Secondary | ICD-10-CM | POA: Diagnosis not present

## 2022-09-16 ENCOUNTER — Other Ambulatory Visit: Payer: Self-pay | Admitting: Family Medicine

## 2022-09-17 NOTE — Telephone Encounter (Signed)
Patient called and stated that she will be flying out of town tomorrow before 12 and would like RX sent for Losartan 50mg  please advise

## 2022-09-17 NOTE — Telephone Encounter (Signed)
She should have an Rx at the pharmacy since 8/8 waiting for her

## 2022-10-10 ENCOUNTER — Other Ambulatory Visit: Payer: Self-pay | Admitting: Family Medicine

## 2022-10-11 NOTE — Telephone Encounter (Signed)
Requested Prescriptions  Pending Prescriptions Disp Refills   propranolol ER (INDERAL LA) 80 MG 24 hr capsule [Pharmacy Med Name: PROPRANOLOL ER 80 MG CAPSULE] 90 capsule 0    Sig: TAKE 1 CAPSULE BY MOUTH EVERY DAY     Cardiovascular:  Beta Blockers Passed - 10/10/2022  3:01 PM      Passed - Last BP in normal range    BP Readings from Last 1 Encounters:  09/05/22 132/72         Passed - Last Heart Rate in normal range    Pulse Readings from Last 1 Encounters:  09/05/22 76         Passed - Valid encounter within last 6 months    Recent Outpatient Visits           1 month ago Aortic atherosclerosis (HCC)   [place] [facility] [place], Megan P, DO   3 months ago Primary hypertension   [place] [facility] [place], Sherran Needs, NP   3 months ago Atrophic glossitis   [place] [facility] [place], Sherran Needs, NP   4 months ago 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

## 2022-10-14 DIAGNOSIS — M25559 Pain in unspecified hip: Secondary | ICD-10-CM | POA: Diagnosis not present

## 2022-10-15 ENCOUNTER — Other Ambulatory Visit: Payer: Self-pay | Admitting: Physician Assistant

## 2022-10-15 DIAGNOSIS — K219 Gastro-esophageal reflux disease without esophagitis: Secondary | ICD-10-CM

## 2022-10-16 NOTE — Telephone Encounter (Signed)
Requested Prescriptions  Pending Prescriptions Disp Refills   omeprazole (PRILOSEC) 20 MG capsule [Pharmacy Med Name: OMEPRAZOLE DR 20 MG CAPSULE] 90 capsule 1    Sig: TAKE 1 CAPSULE BY MOUTH EVERY DAY     Gastroenterology: Proton Pump Inhibitors Passed - 10/15/2022  1:30 AM      Passed - Valid encounter within last 12 months    Recent Outpatient Visits           1 month ago Aortic atherosclerosis (HCC)   [place] [facility] [place], Megan P, DO   3 months ago Primary hypertension   [place] [facility] Pear[place], Sherran Needs, NP   4 months ago Atrophic glossitis   [place] [facility] [place], Sherran Needs, NP   4 months ago Trigeminal neuralgia of left side of face   [place] [facility] [place], Megan P, DO   4 months ago Chronic intractable headache, unspecified headache type   [place] [facility] Dorcas Carrow, DO       Future Appointments             In 4 weeks Laural Benes, Oralia Rud, DO [place] [facility], [facility]

## 2022-10-24 ENCOUNTER — Ambulatory Visit: Payer: Medicare Other | Admitting: Family Medicine

## 2022-11-14 ENCOUNTER — Ambulatory Visit: Payer: Medicare Other | Admitting: Family Medicine

## 2022-12-03 ENCOUNTER — Ambulatory Visit: Payer: Medicare Other | Admitting: Family Medicine

## 2022-12-03 ENCOUNTER — Encounter: Payer: Self-pay | Admitting: Family Medicine

## 2022-12-03 VITALS — BP 127/80 | HR 65 | Ht 60.0 in | Wt 113.6 lb

## 2022-12-03 DIAGNOSIS — M810 Age-related osteoporosis without current pathological fracture: Secondary | ICD-10-CM

## 2022-12-03 DIAGNOSIS — F3342 Major depressive disorder, recurrent, in full remission: Secondary | ICD-10-CM

## 2022-12-03 DIAGNOSIS — M545 Low back pain, unspecified: Secondary | ICD-10-CM | POA: Diagnosis not present

## 2022-12-03 DIAGNOSIS — F32A Depression, unspecified: Secondary | ICD-10-CM

## 2022-12-03 DIAGNOSIS — I10 Essential (primary) hypertension: Secondary | ICD-10-CM

## 2022-12-03 DIAGNOSIS — I700 Atherosclerosis of aorta: Secondary | ICD-10-CM

## 2022-12-03 DIAGNOSIS — Z0000 Encounter for general adult medical examination without abnormal findings: Secondary | ICD-10-CM | POA: Diagnosis not present

## 2022-12-03 DIAGNOSIS — Z87891 Personal history of nicotine dependence: Secondary | ICD-10-CM | POA: Diagnosis not present

## 2022-12-03 DIAGNOSIS — H9193 Unspecified hearing loss, bilateral: Secondary | ICD-10-CM

## 2022-12-03 DIAGNOSIS — Z1211 Encounter for screening for malignant neoplasm of colon: Secondary | ICD-10-CM

## 2022-12-03 DIAGNOSIS — I509 Heart failure, unspecified: Secondary | ICD-10-CM

## 2022-12-03 DIAGNOSIS — M25551 Pain in right hip: Secondary | ICD-10-CM | POA: Diagnosis not present

## 2022-12-03 LAB — MICROALBUMIN, URINE WAIVED
Creatinine, Urine Waived: 200 mg/dL (ref 10–300)
Microalb, Ur Waived: 30 mg/L — ABNORMAL HIGH (ref 0–19)
Microalb/Creat Ratio: <30 mg/g (ref <30)

## 2022-12-03 MED ORDER — PROPRANOLOL HCL ER 80 MG PO CP24: 80.0000 mg | ORAL_CAPSULE | Freq: Every day | ORAL | 1 refills | Status: DC

## 2022-12-03 MED ORDER — LOSARTAN POTASSIUM 50 MG PO TABS: 75.0000 mg | ORAL_TABLET | Freq: Every day | ORAL | 1 refills | Status: DC

## 2022-12-03 MED ORDER — ALENDRONATE SODIUM 70 MG PO TABS: ORAL_TABLET | ORAL | 4 refills | Status: DC

## 2022-12-03 MED ORDER — VENLAFAXINE HCL ER 150 MG PO CP24: 150.0000 mg | ORAL_CAPSULE | Freq: Every day | ORAL | 1 refills | Status: DC

## 2022-12-03 MED ORDER — FUROSEMIDE 20 MG PO TABS: ORAL_TABLET | ORAL | 1 refills | Status: DC

## 2022-12-03 MED ORDER — EMPAGLIFLOZIN 10 MG PO TABS: 10.0000 mg | ORAL_TABLET | Freq: Every day | ORAL | 1 refills | Status: AC

## 2022-12-03 NOTE — Assessment & Plan Note (Signed)
Stable. Continue to follow with cardiology. Continue jardiance. Refills given today. Euvolemic. Call with any concerns.

## 2022-12-03 NOTE — Patient Instructions (Signed)
Preventative Services:  Health Risk Assessment and Personalized Prevention Plan: Done today Bone Mass Measurements: up to date Breast Cancer Screening: Ordered today CVD Screening: Done today Cervical Cancer Screening: N/A Colon Cancer Screening: Done today Depression Screening: Done today Diabetes Screening: Done today Glaucoma Screening: See your eye doctor Hepatitis B vaccine: N/A Hepatitis C screening: Up to date HIV Screening: Up to date Flu Vaccine: Declined Lung cancer Screening: Ordered today Obesity Screening: Done today Pneumonia Vaccines (2): Up to date STI Screening: N/A

## 2022-12-03 NOTE — Assessment & Plan Note (Signed)
Continue to follow with fosamax. Due for DEXA next year. Call with any concerns.

## 2022-12-03 NOTE — Progress Notes (Signed)
BP 127/80   Pulse 65   Ht 5' (1.524 m)   Wt 113 lb 9.6 oz (51.5 kg)   SpO2 98%   BMI 22.19 kg/m    Subjective:    Patient ID: Casey Summers, female    DOB: August 09, 1945, 77 y.o.   MRN: [id number]  HPI: Casey Summers is a 77 y.o. female presenting on 12/03/2022 for comprehensive medical examination. Current medical complaints include:  BACK PAIN Duration: chronic, worse in the last 3 months Mechanism of injury: walking Location: bilateral and low back Onset: gradual Severity: severe Quality: shooting, throbbing, aching Frequency: constant Radiation: none Aggravating factors: moving, walking Alleviating factors: laying down, heating pad Status: worse Treatments attempted: rest, ice, heat, APAP, ibuprofen, and aleve  Relief with NSAIDs?: no Nighttime pain:  yes Paresthesias / decreased sensation:  no Bowel / bladder incontinence:  no Fevers:  no Dysuria / urinary frequency:  no  HYPERTENSION / HYPERLIPIDEMIA Satisfied with current treatment? yes Duration of hypertension: chronic BP monitoring frequency: rarely BP medication side effects: no Past BP meds: losartan Duration of hyperlipidemia: chronic Cholesterol medication side effects: no Cholesterol supplements: none Past cholesterol medications: none Medication compliance: excellent compliance Aspirin: no Recent stressors: no Recurrent headaches: no Visual changes: no Palpitations: no Dyspnea: no Chest pain: no Lower extremity edema: no Dizzy/lightheaded: no  DEPRESSION Mood status: stable Satisfied with current treatment?: yes Symptom severity: mild  Duration of current treatment : chronic Side effects: no Medication compliance: excellent compliance Psychotherapy/counseling: no  Previous psychiatric medications: effexor Depressed mood: no Anxious mood: no Anhedonia: no Significant weight loss or gain: no Insomnia: no  Fatigue: yes Feelings of worthlessness or guilt: no Impaired  concentration/indecisiveness: no Suicidal ideations: no Hopelessness: no Crying spells: no    12/03/2022   11:06 AM 09/05/2022   10:02 AM 07/08/2022    1:07 PM 06/11/2022   10:49 AM 05/21/2022    8:26 AM  Depression screen PHQ 2/9  Decreased Interest 0 0 0 1 0  Down, Depressed, Hopeless 0 0 0 0 0  PHQ - 2 Score 0 0 0 1 0  Altered sleeping 2 0 3 2 3   Tired, decreased energy 0 0 0 0 2  Change in appetite 0 0 0 0 0  Feeling bad or failure about yourself  0 0 0 0 0  Trouble concentrating 0 0 0 0 0  Moving slowly or fidgety/restless 3 0 0 0 0  Suicidal thoughts 0 0 0 0 0  PHQ-9 Score 5 0 3 3 5   Difficult doing work/chores Somewhat difficult Not difficult at all Somewhat difficult Not difficult at all    Menopausal Symptoms: no  Functional Status Survey: Is the patient deaf or have difficulty hearing?: Yes Does the patient have difficulty seeing, even when wearing glasses/contacts?: No Does the patient have difficulty concentrating, remembering, or making decisions?: Yes Does the patient have difficulty walking or climbing stairs?: Yes Does the patient have difficulty dressing or bathing?: No Does the patient have difficulty doing errands alone such as visiting a doctor's office or shopping?: No     12/03/2022   11:06 AM 09/05/2022   10:02 AM 06/11/2022   10:49 AM 05/21/2022    8:26 AM 04/30/2022    4:04 PM  Fall Risk   Falls in the past year? 1 0 0 1 0  Number falls in past yr: 1 0 0 0 0  Injury with Fall? 0 0 0 0 0  Risk for fall due to :  No Fall Risks No Fall Risks No Fall Risks History of fall(s) No Fall Risks  Follow up Falls evaluation completed Falls evaluation completed Falls evaluation completed Falls evaluation completed Falls evaluation completed    Depression Screen    12/03/2022   11:06 AM 09/05/2022   10:02 AM 07/08/2022    1:07 PM 06/11/2022   10:49 AM 05/21/2022    8:26 AM  Depression screen PHQ 2/9  Decreased Interest 0 0 0 1 0  Down, Depressed, Hopeless 0 0 0 0 0   PHQ - 2 Score 0 0 0 1 0  Altered sleeping 2 0 3 2 3   Tired, decreased energy 0 0 0 0 2  Change in appetite 0 0 0 0 0  Feeling bad or failure about yourself  0 0 0 0 0  Trouble concentrating 0 0 0 0 0  Moving slowly or fidgety/restless 3 0 0 0 0  Suicidal thoughts 0 0 0 0 0  PHQ-9 Score 5 0 3 3 5   Difficult doing work/chores Somewhat difficult Not difficult at all Somewhat difficult Not difficult at all    Advanced Directives Does patient have a HCPOA?    no If yes, name and contact information:  Does patient have a living will or MOST form?  no  Past Medical History:  Past Medical History:  Diagnosis Date   Allergy    Cancer (HCC)    breast   Chronic mid back pain    Depression    Hypertension    Osteoporosis     Surgical History:  Past Surgical History:  Procedure Laterality Date   BREAST LUMPECTOMY  01/29/2008   TOTAL ABDOMINAL HYSTERECTOMY W/ BILATERAL SALPINGOOPHORECTOMY      Medications:  Current Outpatient Medications on File Prior to Visit  Medication Sig   lidocaine (XYLOCAINE) 2 % solution Use as directed 15 mLs in the mouth or throat as needed for mouth pain.   omeprazole (PRILOSEC) 20 MG capsule TAKE 1 CAPSULE BY MOUTH EVERY DAY   No current facility-administered medications on file prior to visit.    Allergies:  Allergies  Allergen Reactions   Latex Rash   Nickel Rash    Social History:  Social History   Socioeconomic History   Marital status: Divorced    Spouse name: Not on file   Number of children: Not on file   Years of education: Not on file   Highest education level: Associate degree: academic program  Occupational History   Occupation: retired  Tobacco Use   Smoking status: Former    Current packs/day: 0.00    Average packs/day: 1.3 packs/day for 24.0 years (30.0 ttl pk-yrs)    Types: Cigarettes    Start date: 08/28/1984    Quit date: 08/28/2008    Years since quitting: 14.2   Smokeless tobacco: Never  Vaping Use   Vaping  status: Never Used  Substance and Sexual Activity   Alcohol use: Yes    Alcohol/week: 14.0 standard drinks of alcohol    Types: 14 Glasses of wine per week   Drug use: No   Sexual activity: Not Currently  Other Topics Concern   Not on file  Social History Narrative   Not on file   Social Determinants of Health   Financial Resource Strain: Low Risk  (04/29/2022)   Received from [facility], [facility]   Overall Financial Resource Strain (CARDIA)    Difficulty of Paying Living Expenses: Not hard at all  Food  Insecurity: No Food Insecurity (04/29/2022)   [facility], [facility]   Hunger Vital Sign    Worried About Running Out of Food in the Last Year: Never true    Ran Out of Food in the Last Year: Never true  Transportation Needs: No Transportation Needs (04/29/2022)   Received from [facility], [facility]   [facility] - Transportation    Lack of Transportation (Medical): No    Lack of Transportation (Non-Medical): No  Physical Activity: Insufficiently Active (11/12/2021)   Exercise Vital Sign    Days of Exercise per Week: 3 days    Minutes of Exercise per Session: 40 min  Stress: No Stress Concern Present (11/12/2021)   [employer] of Occupational Health - Occupational Stress Questionnaire    Feeling of Stress : Not at all  Social Connections: Socially Isolated (11/12/2021)   Social Connection and Isolation Panel [NHANES]    Frequency of Communication with Friends and Family: Once a week    Frequency of Social Gatherings with Friends and Family: Once a week    Attends Religious Services: Never    Database administrator or Organizations: No    Attends Banker Meetings: Never    Marital Status: Divorced  Catering manager Violence: Not At Risk (11/12/2021)   Humiliation, Afraid, Rape, and Kick questionnaire    Fear of Current or Ex-Partner: No    Emotionally Abused: No    Physically Abused: No    Sexually Abused: No    Social History   Tobacco Use  Smoking Status Former   Current packs/day: 0.00   Average packs/day: 1.3 packs/day for 24.0 years (30.0 ttl pk-yrs)   Types: Cigarettes   Start date: 08/28/1984   Quit date: 08/28/2008   Years since quitting: 14.2  Smokeless Tobacco Never   Social History   Substance and Sexual Activity  Alcohol Use Yes   Alcohol/week: 14.0 standard drinks of alcohol   Types: 14 Glasses of wine per week    Family History:  Family History  Problem Relation Age of Onset   Congestive Heart Failure Mother    Cancer Sister        breast   Diabetes Daughter     Past medical history, surgical history, medications, allergies, family history and social history reviewed with patient today and changes made to appropriate areas of the chart.   Review of Systems  Constitutional: Negative.   HENT:  Positive for hearing loss. Negative for congestion, ear discharge, ear pain, nosebleeds, sinus pain, sore throat and tinnitus.   Eyes: Negative.   Respiratory: Negative.  Negative for stridor.   Cardiovascular:  Positive for leg swelling. Negative for chest pain, palpitations, orthopnea, claudication and PND.  Gastrointestinal: Negative.   Genitourinary: Negative.   Musculoskeletal:  Positive for back pain and myalgias. Negative for falls, joint pain and neck pain.  Skin: Negative.   Neurological:  Positive for dizziness and headaches. Negative for tingling, tremors, sensory change, speech change, focal weakness, seizures, loss of consciousness and weakness.  Endo/Heme/Allergies:  Positive for polydipsia. Negative for environmental allergies. Does not bruise/bleed easily.  Psychiatric/Behavioral: Negative.      All other ROS negative except what is listed above and in the HPI.      Objective:    BP 127/80   Pulse 65   Ht 5' (1.524 m)   Wt 113 lb 9.6 oz (51.5 kg)   SpO2 98%   BMI 22.19 kg/m  Wt Readings from Last 3 Encounters:  12/03/22 113 lb 9.6 oz (51.5 kg)   09/05/22 122 lb (55.3 kg)  07/08/22 123 lb 6.4 oz (56 kg)    No results found.  Physical Exam Vitals and nursing note reviewed.  Constitutional:      General: She is not in acute distress.    Appearance: Normal appearance. She is normal weight. She is not ill-appearing, toxic-appearing or diaphoretic.  HENT:     Head: Normocephalic and atraumatic.     Right Ear: Tympanic membrane, ear canal and external ear normal. There is no impacted cerumen.     Left Ear: Tympanic membrane, ear canal and external ear normal. There is no impacted cerumen.     Nose: Nose normal. No congestion or rhinorrhea.     Mouth/Throat:     Mouth: Mucous membranes are moist.     Pharynx: Oropharynx is clear. No oropharyngeal exudate or posterior oropharyngeal erythema.  Eyes:     General: No scleral icterus.       Right eye: No discharge.        Left eye: No discharge.     Extraocular Movements: Extraocular movements intact.     Conjunctiva/sclera: Conjunctivae normal.     Pupils: Pupils are equal, round, and reactive to light.  Neck:     Vascular: No carotid bruit.  Cardiovascular:     Rate and Rhythm: Normal rate and regular rhythm.     Pulses: Normal pulses.     Heart sounds: No murmur heard.    No friction rub. No gallop.  Pulmonary:     Effort: Pulmonary effort is normal. No respiratory distress.     Breath sounds: Normal breath sounds. No stridor. No wheezing, rhonchi or rales.  Chest:     Chest wall: No tenderness.  Abdominal:     General: Abdomen is flat. Bowel sounds are normal. There is no distension.     Palpations: Abdomen is soft. There is no mass.     Tenderness: There is no abdominal tenderness. There is no right CVA tenderness, left CVA tenderness, guarding or rebound.     Hernia: No hernia is present.  Genitourinary:    Comments: Breast and pelvic exams deferred with shared decision making Musculoskeletal:        General: No swelling, tenderness, deformity or signs of injury.      Cervical back: Normal range of motion and neck supple. No rigidity. No muscular tenderness.     Right lower leg: No edema.     Left lower leg: No edema.  Lymphadenopathy:     Cervical: No cervical adenopathy.  Skin:    General: Skin is warm and dry.     Capillary Refill: Capillary refill takes less than 2 seconds.     Coloration: Skin is not jaundiced or pale.     Findings: No bruising, erythema, lesion or rash.  Neurological:     General: No focal deficit present.     Mental Status: She is alert and oriented to person, place, and time. Mental status is at baseline.     Cranial Nerves: No cranial nerve deficit.     Sensory: No sensory deficit.     Motor: No weakness.     Coordination: Coordination normal.     Gait: Gait normal.     Deep Tendon Reflexes: Reflexes normal.  Psychiatric:        Mood and Affect: Mood normal.        Behavior: Behavior normal.  Thought Content: Thought content normal.        Judgment: Judgment normal.        12/03/2022   11:27 AM 11/12/2021    9:38 AM 10/20/2020    3:13 PM 08/23/2019   11:24 AM 08/17/2018    1:57 PM  6CIT Screen  What Year? 0 points 0 points 0 points 0 points 0 points  What month? 0 points 0 points 0 points 0 points 0 points  What time? 0 points 0 points 0 points 0 points 0 points  Count back from 20 0 points 0 points 0 points 0 points 0 points  Months in reverse 2 points 0 points 0 points 0 points 0 points  Repeat phrase 0 points 0 points 0 points 4 points   Total Score 2 points 0 points 0 points 4 points     Results for orders placed or performed in visit on 06/18/22  WET PREP FOR TRICH, YEAST, CLUE   Specimen: Sterile Swab   Sterile Swab  Result Value Ref Range   Trichomonas Exam Negative Negative   Yeast Exam Negative Negative   Clue Cell Exam Negative Negative  Vitamin B12  Result Value Ref Range   Vitamin B-12 324 232 - 1,245 pg/mL  Folate  Result Value Ref Range   Folate 4.9 >3.0 ng/mL  CBC With  Diff/Platelet  Result Value Ref Range   WBC 5.7 3.4 - 10.8 x10E3/uL   RBC 3.55 (L) 3.77 - 5.28 x10E6/uL   Hemoglobin 12.4 11.1 - 15.9 g/dL   Hematocrit 81.1 91.4 - 46.6 %   MCV 99 (H) 79 - 97 fL   MCH 34.9 (H) 26.6 - 33.0 pg   MCHC 35.4 31.5 - 35.7 g/dL   RDW 78.2 95.6 - 21.3 %   Platelets 190 150 - 450 x10E3/uL   Neutrophils 69 Not Estab. %   Lymphs 18 Not Estab. %   Monocytes 10 Not Estab. %   Eos 1 Not Estab. %   Basos 1 Not Estab. %   Neutrophils Absolute 3.9 1.4 - 7.0 x10E3/uL   Lymphocytes Absolute 1.0 0.7 - 3.1 x10E3/uL   Monocytes Absolute 0.6 0.1 - 0.9 x10E3/uL   EOS (ABSOLUTE) 0.1 0.0 - 0.4 x10E3/uL   Basophils Absolute 0.0 0.0 - 0.2 x10E3/uL   Immature Granulocytes 1 Not Estab. %   Immature Grans (Abs) 0.1 0.0 - 0.1 x10E3/uL      Assessment & Plan:   Problem List Items Addressed This Visit       Cardiovascular and Mediastinum   Hypertension    Under good control on current regimen. Continue current regimen. Continue to monitor. Call with any concerns. Refills given. Labs drawn today.       Relevant Medications   furosemide (LASIX) 20 MG tablet   losartan (COZAAR) 50 MG tablet   propranolol ER (INDERAL LA) 80 MG 24 hr capsule   Other Relevant Orders   CBC with Differential/Platelet   TSH   Microalbumin, Urine Waived   Aortic atherosclerosis (HCC)    Will keep BP and cholesterol under good control. Continue to monitor. Call with any concerns.       Relevant Medications   furosemide (LASIX) 20 MG tablet   losartan (COZAAR) 50 MG tablet   propranolol ER (INDERAL LA) 80 MG 24 hr capsule   Other Relevant Orders   CBC with Differential/Platelet   Comprehensive metabolic panel   Lipid Panel w/o Chol/HDL Ratio   Congestive heart failure (HCC)  Stable. Continue to follow with cardiology. Continue jardiance. Refills given today. Euvolemic. Call with any concerns.       Relevant Medications   furosemide (LASIX) 20 MG tablet   losartan (COZAAR) 50 MG  tablet   propranolol ER (INDERAL LA) 80 MG 24 hr capsule     Musculoskeletal and Integument   Age-related osteoporosis without current pathological fracture    Continue to follow with fosamax. Due for DEXA next year. Call with any concerns.       Relevant Medications   alendronate (FOSAMAX) 70 MG tablet     Other   Low back pain    In exacerbation. Referral to ortho placed today. Call with any concerns.      Relevant Orders   Ambulatory referral to Orthopedic Surgery   Depression    Under good control on current regimen. Continue current regimen. Continue to monitor. Call with any concerns. Refills given. Labs drawn today.        Relevant Medications   venlafaxine XR (EFFEXOR XR) 150 MG 24 hr capsule   Other Relevant Orders   CBC with Differential/Platelet   Other Visit Diagnoses     Encounter for Medicare annual wellness exam    -  Primary   Preventative care discussed today as below.   Right hip pain       Referral to ortho placed today.   Relevant Orders   Ambulatory referral to Orthopedic Surgery   Bilateral hearing loss, unspecified hearing loss type       Referral to audiology placed today.   Relevant Orders   Ambulatory referral to Audiology   Screening for colon cancer       Cologuard ordered today.   Relevant Orders   Cologuard   History of tobacco abuse       Has not had low dose CT in 5 years-. Quit 14 years ago. Ordered today.   Relevant Orders   CT CHEST LUNG CA SCREEN LOW DOSE W/O CM        Preventative Services:  Health Risk Assessment and Personalized Prevention Plan: Done today Bone Mass Measurements: up to date Breast Cancer Screening: Ordered today CVD Screening: Done today Cervical Cancer Screening: N/A Colon Cancer Screening: Done today Depression Screening: Done today Diabetes Screening: Done today Glaucoma Screening: See your eye doctor Hepatitis B vaccine: N/A Hepatitis C screening: Up to date HIV Screening: Up to date Flu  Vaccine: Declined Lung cancer Screening: Ordered today Obesity Screening: Done today Pneumonia Vaccines (2): Up to date STI Screening: N/A  Follow up plan: Return in about 6 months (around 06/02/2023).   LABORATORY TESTING:  - Pap smear: not applicable  IMMUNIZATIONS:   - Tdap: Tetanus vaccination status reviewed: last tetanus booster within 10 years. - Influenza: Refused - Pneumovax: Up to date - Prevnar: Up to date - Zostavax vaccine: Given elsewhere  SCREENING: -Mammogram: Ordered today  - Colonoscopy: Ordered today  - Bone Density: Up to date  -Hearing Test:  Referral placed today    PATIENT COUNSELING:   Advised to take 1 mg of folate supplement per day if capable of pregnancy.   Sexuality: Discussed sexually transmitted diseases, partner selection, use of condoms, avoidance of unintended pregnancy  and contraceptive alternatives.   Advised to avoid cigarette smoking.  I discussed with the patient that most people either abstain from alcohol or drink within safe limits (<=14/week and <=4 drinks/occasion for males, <=7/weeks and <= 3 drinks/occasion for females) and that the risk for alcohol  disorders and other health effects rises proportionally with the number of drinks per week and how often a drinker exceeds daily limits.  Discussed cessation/primary prevention of drug use and availability of treatment for abuse.   Diet: Encouraged to adjust caloric intake to maintain  or achieve ideal body weight, to reduce intake of dietary saturated fat and total fat, to limit sodium intake by avoiding high sodium foods and not adding table salt, and to maintain adequate dietary potassium and calcium preferably from fresh fruits, vegetables, and low-fat dairy products.    stressed the importance of regular exercise  Injury prevention: Discussed safety belts, safety helmets, smoke detector, smoking near bedding or upholstery.   Dental health: Discussed importance of regular tooth  brushing, flossing, and dental visits.    NEXT PREVENTATIVE PHYSICAL DUE IN 1 YEAR. Return in about 6 months (around 06/02/2023).

## 2022-12-03 NOTE — Assessment & Plan Note (Signed)
Will keep BP and cholesterol under good control. Continue to monitor. Call with any concerns.  

## 2022-12-03 NOTE — Assessment & Plan Note (Signed)
Under good control on current regimen. Continue current regimen. Continue to monitor. Call with any concerns. Refills given. Labs drawn today.   

## 2022-12-03 NOTE — Assessment & Plan Note (Signed)
In exacerbation. Referral to ortho placed today. Call with any concerns.

## 2022-12-04 LAB — CBC WITH DIFFERENTIAL/PLATELET
Basophils Absolute: 0 10*3/uL (ref 0.0–0.2)
Basos: 0 %
EOS (ABSOLUTE): 0 10*3/uL (ref 0.0–0.4)
Eos: 1 %
Hematocrit: 46.2 % (ref 34.0–46.6)
Hemoglobin: 15.3 g/dL (ref 11.1–15.9)
Immature Grans (Abs): 0 10*3/uL (ref 0.0–0.1)
Immature Granulocytes: 0 %
Lymphocytes Absolute: 0.9 10*3/uL (ref 0.7–3.1)
Lymphs: 13 %
MCH: 33.3 pg — ABNORMAL HIGH (ref 26.6–33.0)
MCHC: 33.1 g/dL (ref 31.5–35.7)
MCV: 100 fL — ABNORMAL HIGH (ref 79–97)
Monocytes Absolute: 0.5 10*3/uL (ref 0.1–0.9)
Monocytes: 7 %
Neutrophils Absolute: 5.2 10*3/uL (ref 1.4–7.0)
Neutrophils: 79 %
Platelets: 200 10*3/uL (ref 150–450)
RBC: 4.6 x10E6/uL (ref 3.77–5.28)
RDW: 12.3 % (ref 11.7–15.4)
WBC: 6.7 10*3/uL (ref 3.4–10.8)

## 2022-12-04 LAB — LIPID PANEL W/O CHOL/HDL RATIO
Cholesterol, Total: 225 mg/dL — ABNORMAL HIGH (ref 100–199)
HDL: 87 mg/dL (ref >39)
LDL Chol Calc (NIH): 124 mg/dL — ABNORMAL HIGH (ref 0–99)
Triglycerides: 80 mg/dL (ref 0–149)
VLDL Cholesterol Cal: 14 mg/dL (ref 5–40)

## 2022-12-04 LAB — COMPREHENSIVE METABOLIC PANEL
ALT: 43 [IU]/L — ABNORMAL HIGH (ref 0–32)
AST: 42 [IU]/L — ABNORMAL HIGH (ref 0–40)
Albumin: 4.5 g/dL (ref 3.8–4.8)
Alkaline Phosphatase: 130 [IU]/L — ABNORMAL HIGH (ref 44–121)
BUN/Creatinine Ratio: 20 (ref 12–28)
BUN: 13 mg/dL (ref 8–27)
Bilirubin Total: 1 mg/dL (ref 0.0–1.2)
CO2: 22 mmol/L (ref 20–29)
Calcium: 9.8 mg/dL (ref 8.7–10.3)
Chloride: 97 mmol/L (ref 96–106)
Creatinine, Ser: 0.66 mg/dL (ref 0.57–1.00)
Globulin, Total: 2.3 g/dL (ref 1.5–4.5)
Glucose: 70 mg/dL (ref 70–99)
Potassium: 5 mmol/L (ref 3.5–5.2)
Sodium: 138 mmol/L (ref 134–144)
Total Protein: 6.8 g/dL (ref 6.0–8.5)
eGFR: 91 mL/min/{1.73_m2} (ref >59)

## 2022-12-04 LAB — TSH: TSH: 0.378 u[IU]/mL — ABNORMAL LOW (ref 0.450–4.500)

## 2022-12-16 DIAGNOSIS — Z1211 Encounter for screening for malignant neoplasm of colon: Secondary | ICD-10-CM | POA: Diagnosis not present

## 2022-12-17 ENCOUNTER — Encounter: Payer: Self-pay | Admitting: Family Medicine

## 2022-12-17 DIAGNOSIS — M545 Low back pain, unspecified: Secondary | ICD-10-CM | POA: Diagnosis not present

## 2022-12-17 DIAGNOSIS — M25551 Pain in right hip: Secondary | ICD-10-CM | POA: Diagnosis not present

## 2022-12-24 LAB — COLOGUARD: COLOGUARD: NEGATIVE

## 2022-12-25 DIAGNOSIS — H6063 Unspecified chronic otitis externa, bilateral: Secondary | ICD-10-CM | POA: Diagnosis not present

## 2022-12-25 DIAGNOSIS — H6982 Other specified disorders of Eustachian tube, left ear: Secondary | ICD-10-CM | POA: Diagnosis not present

## 2022-12-25 DIAGNOSIS — H903 Sensorineural hearing loss, bilateral: Secondary | ICD-10-CM | POA: Diagnosis not present

## 2023-01-01 ENCOUNTER — Other Ambulatory Visit: Payer: Self-pay

## 2023-01-01 DIAGNOSIS — Z122 Encounter for screening for malignant neoplasm of respiratory organs: Secondary | ICD-10-CM

## 2023-01-01 DIAGNOSIS — Z87891 Personal history of nicotine dependence: Secondary | ICD-10-CM

## 2023-01-02 DIAGNOSIS — Z1231 Encounter for screening mammogram for malignant neoplasm of breast: Secondary | ICD-10-CM | POA: Diagnosis not present

## 2023-01-02 LAB — HM MAMMOGRAPHY

## 2023-01-03 ENCOUNTER — Encounter: Payer: Self-pay | Admitting: Family Medicine

## 2023-01-08 ENCOUNTER — Encounter: Payer: Self-pay | Admitting: Adult Health

## 2023-01-08 ENCOUNTER — Ambulatory Visit: Payer: Medicare Other | Admitting: Adult Health

## 2023-01-08 DIAGNOSIS — Z87891 Personal history of nicotine dependence: Secondary | ICD-10-CM

## 2023-01-08 NOTE — Progress Notes (Signed)
  Virtual Visit via Telephone Note  I connected with Casey Summers , 01/08/23 10:41 AM by a telemedicine application and verified that I am speaking with the correct person using two identifiers.  Location: Patient: home Provider: home   I discussed the limitations of evaluation and management by telemedicine and the availability of in person appointments. The patient expressed understanding and agreed to proceed.   Shared Decision Making Visit Lung Cancer Screening Program [phone])   Eligibility: 77 y.o. Pack Years Smoking History Calculation = 39 pack years  (# packs/per year x # years smoked) Recent History of coughing up blood  no Unexplained weight loss? no ( >Than 15 pounds within the last 6 months ) Prior History Lung / other cancer = yes, breast cancer 2011 (Diagnosis within the last 5 years already requiring surveillance chest CT Scans). Smoking Status Former Smoker Former Smokers: Years since quit: 10 years  Quit Date: 2014  Visit Components: Discussion included one or more decision making aids. YES Discussion included risk/benefits of screening. YES Discussion included potential follow up diagnostic testing for abnormal scans. YES Discussion included meaning and risk of over diagnosis. YES Discussion included meaning and risk of False Positives. YES Discussion included meaning of total radiation exposure. YES  Counseling Included: Importance of adherence to annual lung cancer LDCT screening. YES Impact of comorbidities on ability to participate in the program. YES Ability and willingness to under diagnostic treatment. YES  Smoking Cessation Counseling: Former Smokers:  Discussed the importance of maintaining cigarette abstinence. yes Diagnosis Code: Personal History of Nicotine Dependence. I69.629 Information about tobacco cessation classes and interventions provided to patient. Yes Patient provided with "ticket" for LDCT Scan. yes Written Order for Lung  Cancer Screening with LDCT placed in Epic. Yes (CT Chest Lung Cancer Screening Low Dose W/O CM) [id number]  Z12.2-Screening of respiratory organs Z87.891-Personal history of nicotine dependence   Danford Bad 01/08/23

## 2023-01-08 NOTE — Patient Instructions (Signed)

## 2023-01-09 ENCOUNTER — Other Ambulatory Visit: Payer: Self-pay

## 2023-01-09 ENCOUNTER — Ambulatory Visit
Admission: RE | Admit: 2023-01-09 | Discharge: 2023-01-09 | Disposition: A | Discharge status: Home / Self Care | Payer: Medicare Other | Source: Ambulatory Visit | Attending: Acute Care | Admitting: Acute Care

## 2023-01-09 DIAGNOSIS — Z122 Encounter for screening for malignant neoplasm of respiratory organs: Secondary | ICD-10-CM | POA: Insufficient documentation

## 2023-01-09 DIAGNOSIS — Z87891 Personal history of nicotine dependence: Secondary | ICD-10-CM | POA: Diagnosis not present

## 2023-01-10 DIAGNOSIS — M5451 Vertebrogenic low back pain: Secondary | ICD-10-CM | POA: Diagnosis not present

## 2023-01-10 DIAGNOSIS — M545 Low back pain, unspecified: Secondary | ICD-10-CM | POA: Diagnosis not present

## 2023-01-17 DIAGNOSIS — M5451 Vertebrogenic low back pain: Secondary | ICD-10-CM | POA: Diagnosis not present

## 2023-01-23 ENCOUNTER — Other Ambulatory Visit: Payer: Self-pay

## 2023-01-23 DIAGNOSIS — Z122 Encounter for screening for malignant neoplasm of respiratory organs: Secondary | ICD-10-CM

## 2023-01-23 DIAGNOSIS — Z87891 Personal history of nicotine dependence: Secondary | ICD-10-CM

## 2023-02-06 DIAGNOSIS — M5451 Vertebrogenic low back pain: Secondary | ICD-10-CM | POA: Diagnosis not present

## 2023-02-13 DIAGNOSIS — M5451 Vertebrogenic low back pain: Secondary | ICD-10-CM | POA: Diagnosis not present

## 2023-02-21 ENCOUNTER — Other Ambulatory Visit: Payer: Self-pay | Admitting: Family Medicine

## 2023-02-21 NOTE — Telephone Encounter (Signed)
Requested Prescriptions  Refused Prescriptions Disp Refills   alendronate (FOSAMAX) 70 MG tablet [Pharmacy Med Name: ALENDRONATE SODIUM 70 MG TAB] 12 tablet 4    Sig: TAKE 1 TABLET EVERY 7 DAYS. TAKE WITH A FULL GLASS OF WATER ON AN EMPTY STOMACH.     Endocrinology:  Bisphosphonates Failed - 02/21/2023  5:36 PM      Failed - Vitamin D in normal range and within 360 days    Vit D, 25-Hydroxy  Date Value Ref Range Status  06/29/2020 76.6 30.0 - 100.0 ng/mL Final    Comment:    Vitamin D deficiency has been defined by the Institute of Medicine and an Endocrine Society practice guideline as a level of serum 25-OH vitamin D less than 20 ng/mL (1,2). The Endocrine Society went on to further define vitamin D insufficiency as a level between 21 and 29 ng/mL (2). 1. IOM (Institute of Medicine). 2010. Dietary reference    intakes for calcium and D. [place]: The    [employer]. 2. Holick MF, Binkley Marquez, Bischoff-Ferrari HA, et al.    Evaluation, treatment, and prevention of vitamin D    deficiency: an Endocrine Society clinical practice    guideline. JCEM. 2011 Jul; 96(7):1911-30.          Failed - Mg Level in normal range and within 360 days    No results found for: "MG"       Failed - Phosphate in normal range and within 360 days    No results found for: "PHOS"       Failed - Bone Mineral Density or Dexa Scan completed in the last 2 years      Passed - Ca in normal range and within 360 days    Calcium  Date Value Ref Range Status  12/03/2022 9.8 8.7 - 10.3 mg/dL Final         Passed - Cr in normal range and within 360 days    Creatinine  Date Value Ref Range Status  02/22/2019 116.9 20.0 - 300.0 mg/dL Final   Creatinine, Ser  Date Value Ref Range Status  12/03/2022 0.66 0.57 - 1.00 mg/dL Final         Passed - eGFR is 30 or above and within 360 days    GFR calc Af Amer  Date Value Ref Range Status  10/12/2019 >60 >60 mL/min Final   GFR calc non Af Amer   Date Value Ref Range Status  10/12/2019 >60 >60 mL/min Final   eGFR  Date Value Ref Range Status  12/03/2022 91 >59 mL/min/1.73 Final         Passed - Valid encounter within last 12 months    Recent Outpatient Visits           2 months ago Encounter for [employer] annual wellness exam   [place] [facility] [place], Megan P, DO   5 months ago Aortic atherosclerosis [facility] [employer] [facility])   [place] [facility] [place], Megan P, DO   7 months ago Primary hypertension   [place] [facility] [place], Sherran Needs, NP   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

## 2023-04-03 DIAGNOSIS — M25559 Pain in unspecified hip: Secondary | ICD-10-CM | POA: Diagnosis not present

## 2023-05-21 ENCOUNTER — Other Ambulatory Visit: Payer: Self-pay | Admitting: Family Medicine

## 2023-05-21 DIAGNOSIS — K219 Gastro-esophageal reflux disease without esophagitis: Secondary | ICD-10-CM

## 2023-05-21 NOTE — Telephone Encounter (Signed)
 Requested Prescriptions  Pending Prescriptions Disp Refills   omeprazole  (PRILOSEC) 20 MG capsule [Pharmacy Med Name: OMEPRAZOLE  DR 20 MG CAPSULE] 90 capsule 1    Sig: TAKE 1 CAPSULE BY MOUTH EVERY DAY     Gastroenterology: Proton Pump Inhibitors Failed - 05/21/2023  2:42 PM      Failed - Valid encounter within last 12 months    Recent Outpatient Visits   None     Future Appointments             In 1 week Casey Dupre, DO [place] [facility], [facility]

## 2023-05-30 ENCOUNTER — Ambulatory Visit: Payer: Self-pay | Admitting: Family Medicine

## 2023-06-02 ENCOUNTER — Ambulatory Visit: Payer: Medicare Other | Admitting: Family Medicine

## 2023-06-03 ENCOUNTER — Ambulatory Visit: Payer: Medicare Other | Admitting: Family Medicine

## 2023-07-29 DIAGNOSIS — R079 Chest pain, unspecified: Secondary | ICD-10-CM | POA: Diagnosis not present

## 2023-07-29 DIAGNOSIS — I10 Essential (primary) hypertension: Secondary | ICD-10-CM | POA: Diagnosis not present

## 2023-07-29 DIAGNOSIS — I5032 Chronic diastolic (congestive) heart failure: Secondary | ICD-10-CM | POA: Diagnosis not present

## 2023-07-29 DIAGNOSIS — R0609 Other forms of dyspnea: Secondary | ICD-10-CM | POA: Diagnosis not present

## 2023-07-29 DIAGNOSIS — R42 Dizziness and giddiness: Secondary | ICD-10-CM | POA: Diagnosis not present

## 2023-07-30 DIAGNOSIS — R079 Chest pain, unspecified: Secondary | ICD-10-CM | POA: Diagnosis not present

## 2023-07-30 DIAGNOSIS — R42 Dizziness and giddiness: Secondary | ICD-10-CM | POA: Diagnosis not present

## 2023-07-30 DIAGNOSIS — I5032 Chronic diastolic (congestive) heart failure: Secondary | ICD-10-CM | POA: Diagnosis not present

## 2023-07-30 DIAGNOSIS — I110 Hypertensive heart disease with heart failure: Secondary | ICD-10-CM | POA: Diagnosis not present

## 2023-07-30 DIAGNOSIS — R0609 Other forms of dyspnea: Secondary | ICD-10-CM | POA: Diagnosis not present

## 2023-07-31 ENCOUNTER — Encounter: Payer: Self-pay | Admitting: Cardiology

## 2023-08-19 DIAGNOSIS — R42 Dizziness and giddiness: Secondary | ICD-10-CM | POA: Diagnosis not present

## 2023-08-20 DIAGNOSIS — R42 Dizziness and giddiness: Secondary | ICD-10-CM | POA: Diagnosis not present

## 2023-08-23 ENCOUNTER — Other Ambulatory Visit: Payer: Self-pay | Admitting: Family Medicine

## 2023-08-23 DIAGNOSIS — K219 Gastro-esophageal reflux disease without esophagitis: Secondary | ICD-10-CM

## 2023-08-25 NOTE — Telephone Encounter (Signed)
 Courtesy refill. Patient will need an office visit for additional refills.  Requested Prescriptions  Pending Prescriptions Disp Refills   omeprazole  (PRILOSEC) 20 MG capsule [Pharmacy Med Name: OMEPRAZOLE  DR 20 MG CAPSULE] 30 capsule 0    Sig: TAKE 1 CAPSULE BY MOUTH EVERY DAY     Gastroenterology: Proton Pump Inhibitors Failed - 08/25/2023  1:42 PM      Failed - Valid encounter within last 12 months    Recent Outpatient Visits   None

## 2023-09-10 ENCOUNTER — Other Ambulatory Visit (HOSPITAL_COMMUNITY): Payer: Self-pay

## 2023-09-10 ENCOUNTER — Telehealth: Payer: Self-pay

## 2023-09-10 DIAGNOSIS — I10 Essential (primary) hypertension: Secondary | ICD-10-CM | POA: Diagnosis not present

## 2023-09-10 DIAGNOSIS — R42 Dizziness and giddiness: Secondary | ICD-10-CM | POA: Diagnosis not present

## 2023-09-10 DIAGNOSIS — I5032 Chronic diastolic (congestive) heart failure: Secondary | ICD-10-CM | POA: Diagnosis not present

## 2023-09-10 DIAGNOSIS — E7849 Other hyperlipidemia: Secondary | ICD-10-CM | POA: Diagnosis not present

## 2023-09-10 NOTE — Telephone Encounter (Signed)
 Pharmacy Patient Advocate Encounter   Received notification from CoverMyMeds that prior authorization for Emgality  120MG /ML auto-injectors  is required/requested.   Insurance verification completed.   The patient is insured through [employer] .   Per test claim: PA required and submitted KEY/EOC/Request #: [id number] from 08/11/2023 to 09/09/2024

## 2023-10-13 ENCOUNTER — Ambulatory Visit (INDEPENDENT_AMBULATORY_CARE_PROVIDER_SITE_OTHER): Admitting: Family Medicine

## 2023-10-13 ENCOUNTER — Encounter: Payer: Self-pay | Admitting: Family Medicine

## 2023-10-13 VITALS — BP 156/85 | HR 66 | Ht 60.0 in | Wt 118.9 lb

## 2023-10-13 DIAGNOSIS — I700 Atherosclerosis of aorta: Secondary | ICD-10-CM

## 2023-10-13 DIAGNOSIS — I10 Essential (primary) hypertension: Secondary | ICD-10-CM

## 2023-10-13 DIAGNOSIS — M810 Age-related osteoporosis without current pathological fracture: Secondary | ICD-10-CM

## 2023-10-13 DIAGNOSIS — F3342 Major depressive disorder, recurrent, in full remission: Secondary | ICD-10-CM | POA: Diagnosis not present

## 2023-10-13 DIAGNOSIS — K219 Gastro-esophageal reflux disease without esophagitis: Secondary | ICD-10-CM | POA: Diagnosis not present

## 2023-10-13 MED ORDER — OMEPRAZOLE 20 MG PO CPDR
20.0000 mg | DELAYED_RELEASE_CAPSULE | Freq: Every day | ORAL | 1 refills | Status: AC
Start: 2023-10-13 — End: ?

## 2023-10-13 MED ORDER — VENLAFAXINE HCL ER 150 MG PO CP24: 150.0000 mg | ORAL_CAPSULE | Freq: Every day | ORAL | 1 refills | Status: AC

## 2023-10-13 MED ORDER — PROPRANOLOL HCL ER 80 MG PO CP24: 80.0000 mg | ORAL_CAPSULE | Freq: Every day | ORAL | 1 refills | Status: AC

## 2023-10-13 MED ORDER — LOSARTAN POTASSIUM 50 MG PO TABS: 50.0000 mg | ORAL_TABLET | Freq: Every day | ORAL | 1 refills | Status: AC

## 2023-10-13 MED ORDER — VENLAFAXINE HCL ER 75 MG PO CP24: 75.0000 mg | ORAL_CAPSULE | Freq: Every day | ORAL | 0 refills | Status: DC

## 2023-10-13 NOTE — Assessment & Plan Note (Signed)
 Under good control on current regimen. Continue current regimen. Continue to monitor. Call with any concerns. Refills given.

## 2023-10-13 NOTE — Assessment & Plan Note (Signed)
Due for repeat DEXA. Ordered today

## 2023-10-13 NOTE — Assessment & Plan Note (Signed)
 Under good control on current regimen. Continue current regimen. Continue to monitor. Call with any concerns. Refills given. Labs drawn by cardiology in July.

## 2023-10-13 NOTE — Assessment & Plan Note (Signed)
 Not doing great. Will increase her effexor  to 225mg  and recheck in 3 months. Call with any concerns.

## 2023-10-13 NOTE — Progress Notes (Signed)
 BP (!) 156/85   Pulse 66   Ht 5' (1.524 m)   Wt 118 lb 14.4 oz (53.9 kg)   SpO2 97%   BMI 23.22 kg/m    Subjective:    Patient ID: Casey Summers, female    DOB: 1945/05/19, 78 y.o.   MRN: [id number]  HPI: Casey Summers is a 78 y.o. female  Chief Complaint  Patient presents with   Hypertension   Hyperlipidemia   HYPERTENSION / HYPERLIPIDEMIA Satisfied with current treatment? yes Duration of hypertension: chronic BP monitoring frequency: not checking BP medication side effects: no Past BP meds: losartan , propranolol  Duration of hyperlipidemia: chronic Cholesterol medication side effects: no Cholesterol supplements: none Past cholesterol medications: none Medication compliance: excellent compliance Aspirin: no Recent stressors: no Recurrent headaches: no Visual changes: no Palpitations: no Dyspnea: no Chest pain: no Lower extremity edema: no Dizzy/lightheaded: no  DEPRESSION- husband passed away several months ago-having more trouble. Grandson got in a car accident while she was here.  Mood status: exacerbated Satisfied with current treatment?: no Symptom severity: moderate  Duration of current treatment : chronic Side effects: no Medication compliance: excellent compliance Psychotherapy/counseling: no  Previous psychiatric medications: effexor  Depressed mood: yes Anxious mood: yes Anhedonia: no Significant weight loss or gain: no Insomnia: no  Fatigue: yes Feelings of worthlessness or guilt: yes Impaired concentration/indecisiveness: no Suicidal ideations: no Hopelessness: no Crying spells: no    12/03/2022   11:06 AM 09/05/2022   10:02 AM 07/08/2022    1:07 PM 06/11/2022   10:49 AM 05/21/2022    8:26 AM  Depression screen PHQ 2/9  Decreased Interest 0 0 0 1 0  Down, Depressed, Hopeless 0 0 0 0 0  PHQ - 2 Score 0 0 0 1 0  Altered sleeping 2 0 3 2 3   Tired, decreased energy 0 0 0 0 2  Change in appetite 0 0 0 0 0  Feeling bad or failure  about yourself  0 0 0 0 0  Trouble concentrating 0 0 0 0 0  Moving slowly or fidgety/restless 3 0 0 0 0  Suicidal thoughts 0 0 0 0 0  PHQ-9 Score 5 0 3 3 5   Difficult doing work/chores Somewhat difficult Not difficult at all Somewhat difficult Not difficult at all     Relevant past medical, surgical, family and social history reviewed and updated as indicated. Interim medical history since our last visit reviewed. Allergies and medications reviewed and updated.  Review of Systems  Constitutional: Negative.   Respiratory: Negative.    Cardiovascular: Negative.   Musculoskeletal: Negative.   Skin: Negative.   Psychiatric/Behavioral:  Positive for dysphoric mood. Negative for agitation, behavioral problems, confusion, decreased concentration, hallucinations, self-injury, sleep disturbance and suicidal ideas. The patient is nervous/anxious. The patient is not hyperactive.     Per HPI unless specifically indicated above     Objective:    BP (!) 156/85   Pulse 66   Ht 5' (1.524 m)   Wt 118 lb 14.4 oz (53.9 kg)   SpO2 97%   BMI 23.22 kg/m   Wt Readings from Last 3 Encounters:  10/13/23 118 lb 14.4 oz (53.9 kg)  12/03/22 113 lb 9.6 oz (51.5 kg)  09/05/22 122 lb (55.3 kg)    Physical Exam Vitals and nursing note reviewed.  Constitutional:      General: She is not in acute distress.    Appearance: Normal appearance. She is not ill-appearing, toxic-appearing or diaphoretic.  HENT:  Head: Normocephalic and atraumatic.     Right Ear: External ear normal.     Left Ear: External ear normal.     Nose: Nose normal.     Mouth/Throat:     Mouth: Mucous membranes are moist.     Pharynx: Oropharynx is clear.  Eyes:     General: No scleral icterus.       Right eye: No discharge.        Left eye: No discharge.     Extraocular Movements: Extraocular movements intact.     Conjunctiva/sclera: Conjunctivae normal.     Pupils: Pupils are equal, round, and reactive to light.   Cardiovascular:     Rate and Rhythm: Normal rate and regular rhythm.     Pulses: Normal pulses.     Heart sounds: Normal heart sounds. No murmur heard.    No friction rub. No gallop.  Pulmonary:     Effort: Pulmonary effort is normal. No respiratory distress.     Breath sounds: Normal breath sounds. No stridor. No wheezing, rhonchi or rales.  Chest:     Chest wall: No tenderness.  Musculoskeletal:        General: Normal range of motion.     Cervical back: Normal range of motion and neck supple.  Skin:    General: Skin is warm and dry.     Capillary Refill: Capillary refill takes less than 2 seconds.     Coloration: Skin is not jaundiced or pale.     Findings: No bruising, erythema, lesion or rash.  Neurological:     General: No focal deficit present.     Mental Status: She is alert and oriented to person, place, and time. Mental status is at baseline.  Psychiatric:        Mood and Affect: Mood is anxious. Affect is tearful.        Behavior: Behavior normal.        Thought Content: Thought content normal.        Judgment: Judgment normal.     Results for orders placed or performed in visit on 01/03/23  HM MAMMOGRAPHY   Collection Time: 01/02/23  7:33 AM  Result Value Ref Range   HM Mammogram 0-4 Bi-Rad 0-4 Bi-Rad, Self Reported Normal      Assessment & Plan:   Problem List Items Addressed This Visit       Cardiovascular and Mediastinum   Hypertension - Primary   Running high- a lot of stress with grandson's accident. Will continue current regimen and recheck in 3 months. Call with any concerns.       Relevant Medications   propranolol  ER (INDERAL  LA) 80 MG 24 hr capsule   losartan  (COZAAR ) 50 MG tablet   Aortic atherosclerosis (HCC)   Under good control on current regimen. Continue current regimen. Continue to monitor. Call with any concerns. Refills given. Labs drawn by cardiology in July.        Relevant Medications   propranolol  ER (INDERAL  LA) 80 MG 24 hr  capsule   losartan  (COZAAR ) 50 MG tablet     Digestive   Gastroesophageal reflux disease   Under good control on current regimen. Continue current regimen. Continue to monitor. Call with any concerns. Refills given.        Relevant Medications   omeprazole  (PRILOSEC) 20 MG capsule     Musculoskeletal and Integument   Age-related osteoporosis without current pathological fracture   Due for repeat DEXA. Ordered today.  Relevant Orders   DG Bone Density     Other   Depression   Not doing great. Will increase her effexor  to 225mg  and recheck in 3 months. Call with any concerns.       Relevant Medications   venlafaxine  XR (EFFEXOR  XR) 150 MG 24 hr capsule   venlafaxine  XR (EFFEXOR  XR) 75 MG 24 hr capsule     Follow up plan: Return in about 3 months (around 01/12/2024) for physical.

## 2023-10-13 NOTE — Assessment & Plan Note (Signed)
 Running high- a lot of stress with grandson's accident. Will continue current regimen and recheck in 3 months. Call with any concerns.

## 2023-11-08 DIAGNOSIS — M549 Dorsalgia, unspecified: Secondary | ICD-10-CM | POA: Diagnosis not present

## 2023-11-26 IMAGING — DX DG CHEST 2V
2 series · 2 of 2 positions shown · non-contrast
Comparison: None Available.

CLINICAL DATA: Cough for 9 months.

EXAM:
CHEST - 2 VIEW

[chest pa]
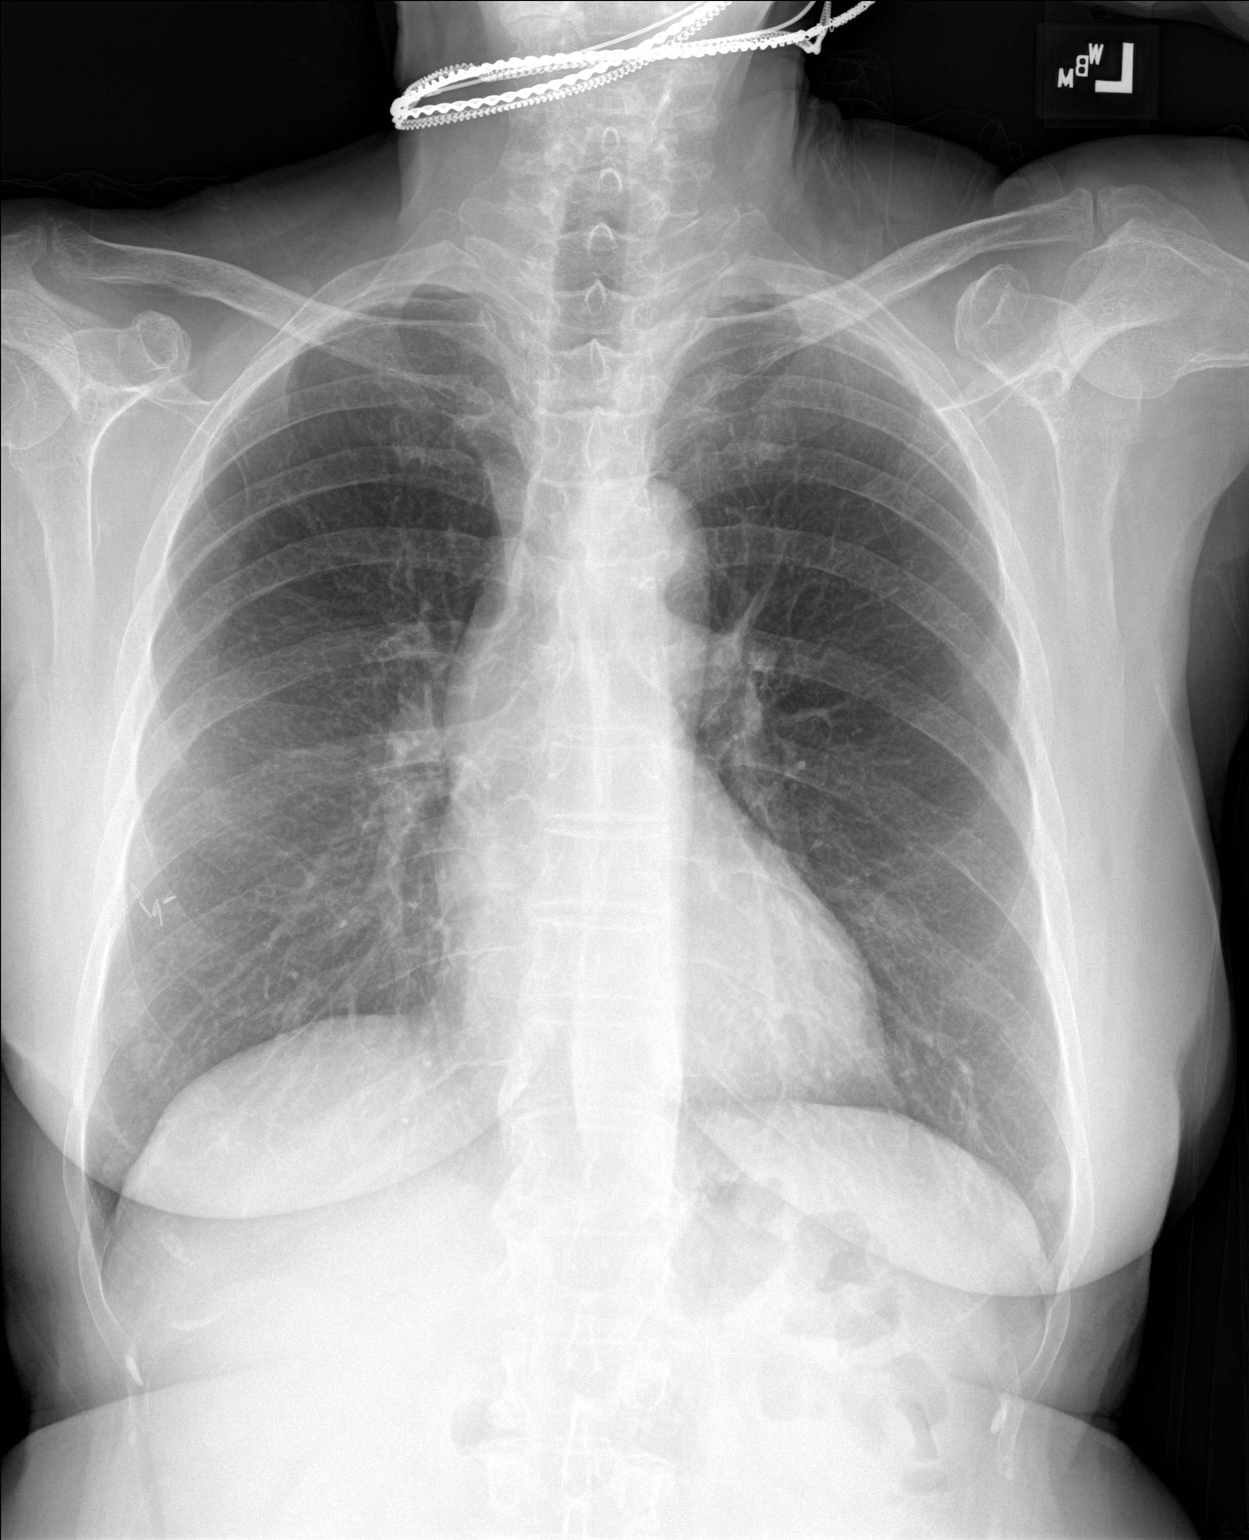

[chest lat]
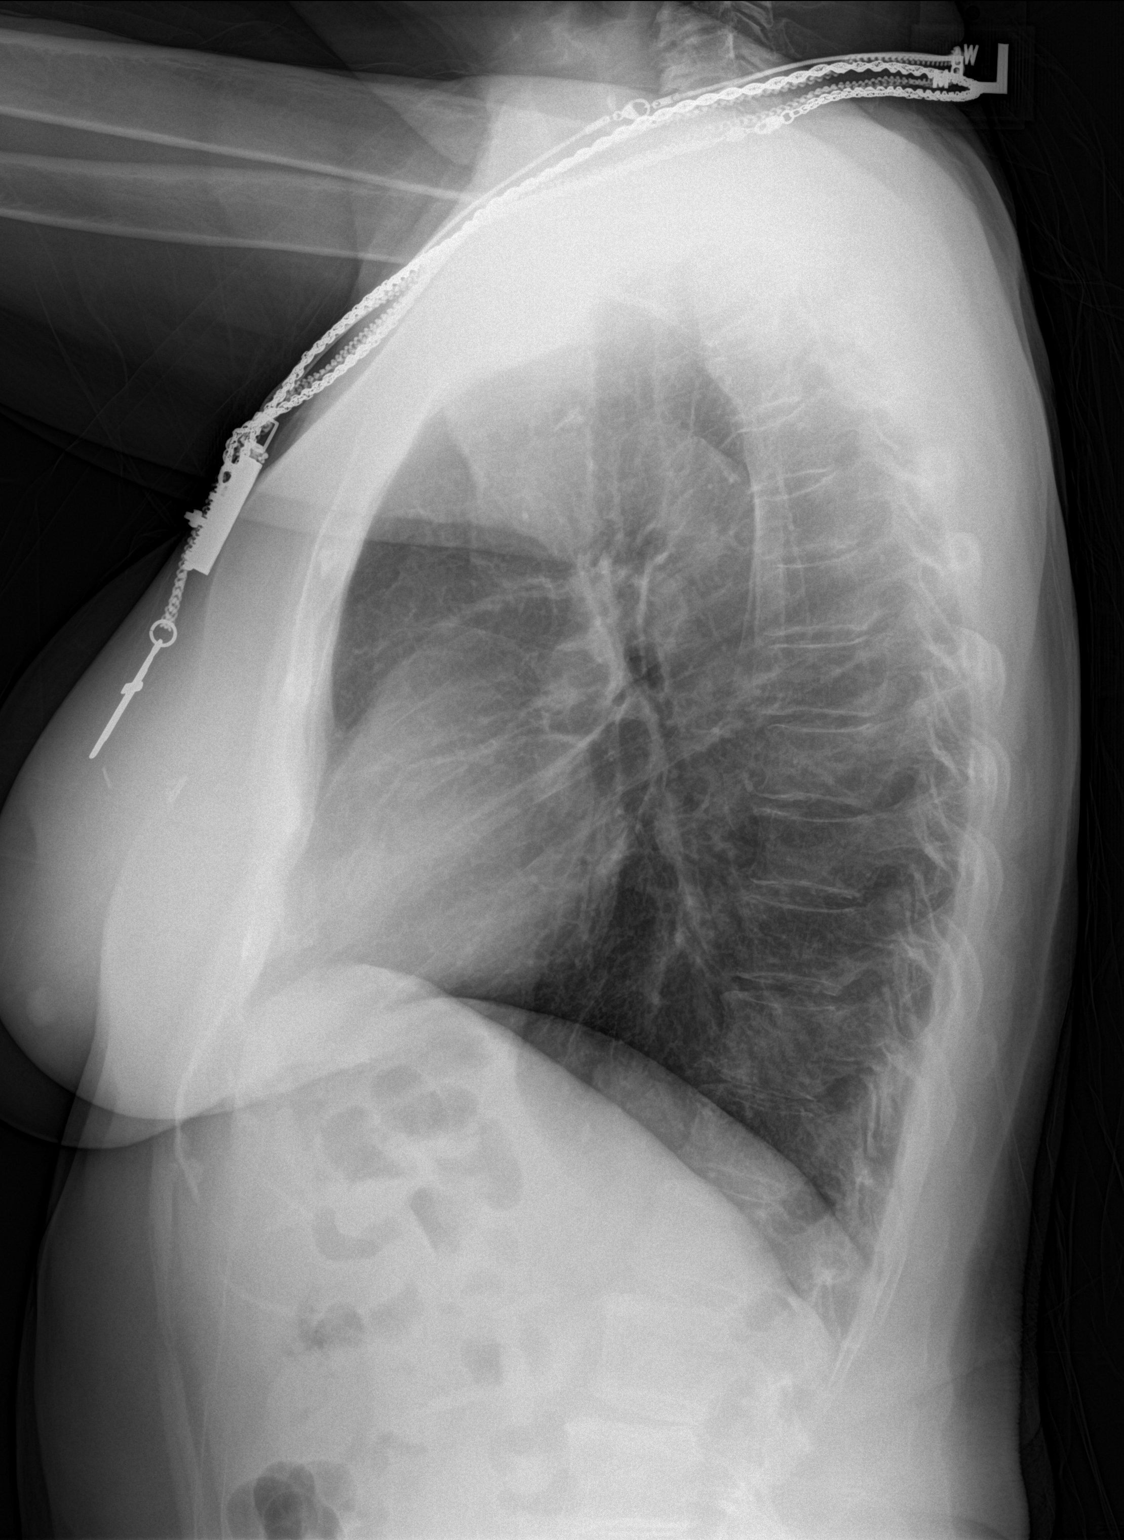

[2 of 2 positions shown; findings below may reference images not displayed]

FINDINGS: The heart size and mediastinal contours are within normal limits.
Both lungs are clear. The visualized skeletal structures are
unremarkable.
IMPRESSION: No active cardiopulmonary disease.

## 2023-12-23 ENCOUNTER — Telehealth: Payer: Self-pay | Admitting: Family Medicine

## 2023-12-23 ENCOUNTER — Other Ambulatory Visit: Payer: Self-pay | Admitting: Family Medicine

## 2023-12-23 NOTE — Telephone Encounter (Signed)
 Reviewed patient's chart and both scans are due. Ok to order for patient and send to [facility]? (This is where she has gone in the past)

## 2023-12-23 NOTE — Telephone Encounter (Signed)
 Absolutely. If she'll go

## 2023-12-23 NOTE — Telephone Encounter (Unsigned)
 Copied from CRM [id number]. Topic: Clinical - Medication Refill >> Dec 23, 2023 12:48 PM Berwyn MATSU wrote: Medication: losartan  (COZAAR ) 50 MG tablet   Has the patient contacted their pharmacy? Yes (Agent: If no, request that the patient contact the pharmacy for the refill. If patient does not wish to contact the pharmacy document the reason why and proceed with request.) (Agent: If yes, when and what did the pharmacy advise?)  This is the patient's preferred pharmacy:  [facility] [phone] [address] [place], [place] - [address] [place] [address] Phone: [phone] Fax: [phone]  Is this the correct pharmacy for this prescription? Yes If no, delete pharmacy and type the correct one.   Has the prescription been filled recently? Yes  Is the patient out of the medication? No  Has the patient been seen for an appointment in the last year OR does the patient have an upcoming appointment? Yes  Can we respond through MyChart? Yes  Agent: Please be advised that Rx refills may take up to 3 business days. We ask that you follow-up with your pharmacy.

## 2023-12-23 NOTE — Telephone Encounter (Signed)
 Copied from CRM [id number]. Topic: Referral - Request for Referral >> Dec 23, 2023 12:46 PM Berwyn MATSU wrote: Did the patient discuss referral with their provider in the last year? Yes (If No - schedule appointment) (If Yes - send message)  Appointment offered? Yes  Type of order/referral and detailed reason for visit: Mammogram and Dexa scan  Preference of office, provider, location: [place] or [place]   If referral order, have you been seen by this specialty before? Yes (If Yes, this issue or another issue? When? Where?  Can we respond through MyChart? Yes

## 2023-12-24 ENCOUNTER — Other Ambulatory Visit: Payer: Self-pay | Admitting: Family Medicine

## 2023-12-24 DIAGNOSIS — M810 Age-related osteoporosis without current pathological fracture: Secondary | ICD-10-CM

## 2023-12-24 DIAGNOSIS — Z1231 Encounter for screening mammogram for malignant neoplasm of breast: Secondary | ICD-10-CM

## 2023-12-24 NOTE — Telephone Encounter (Signed)
 Too soon for refill.  Requested Prescriptions  Pending Prescriptions Disp Refills   losartan  (COZAAR ) 50 MG tablet 90 tablet 1    Sig: Take 1 tablet (50 mg total) by mouth daily. Per Cardiologist     Cardiovascular:  Angiotensin Receptor Blockers Failed - 12/24/2023  4:50 PM      Failed - Cr in normal range and within 180 days    Creatinine  Date Value Ref Range Status  02/22/2019 116.9 20.0 - 300.0 mg/dL Final   Creatinine, Ser  Date Value Ref Range Status  12/03/2022 0.66 0.57 - 1.00 mg/dL Final         Failed - K in normal range and within 180 days    Potassium  Date Value Ref Range Status  12/03/2022 5.0 3.5 - 5.2 mmol/L Final         Failed - Last BP in normal range    BP Readings from Last 1 Encounters:  10/13/23 (!) 156/85         Passed - Patient is not pregnant      Passed - Valid encounter within last 6 months    Recent Outpatient Visits           2 months ago Primary hypertension   [place] [facility] [place], [place], DO

## 2023-12-29 NOTE — Telephone Encounter (Signed)
 Orders have been placed and printed for providers signature. Will fax once both have been signed.

## 2023-12-31 NOTE — Telephone Encounter (Signed)
 Orders have been faxed in for the patient.

## 2024-01-07 ENCOUNTER — Other Ambulatory Visit: Payer: Self-pay | Admitting: Family Medicine

## 2024-01-09 NOTE — Telephone Encounter (Signed)
 Requested Prescriptions  Pending Prescriptions Disp Refills   venlafaxine  XR (EFFEXOR -XR) 75 MG 24 hr capsule [Pharmacy Med Name: VENLAFAXINE  HCL ER 75 MG CAP] 90 capsule 0    Sig: TAKE 1 CAPSULE (75 MG TOTAL) BY MOUTH DAILY WITH BREAKFAST. TAKE WITH 150MG  FOR 225MG  TOTAL DAILY     Psychiatry: Antidepressants - SNRI - desvenlafaxine & venlafaxine  Failed - 01/09/2024  9:04 AM      Failed - Cr in normal range and within 360 days    Creatinine  Date Value Ref Range Status  02/22/2019 116.9 20.0 - 300.0 mg/dL Final   Creatinine, Ser  Date Value Ref Range Status  12/03/2022 0.66 0.57 - 1.00 mg/dL Final         Failed - Completed PHQ-2 or PHQ-9 in the last 360 days      Failed - Last BP in normal range    BP Readings from Last 1 Encounters:  10/13/23 (!) 156/85         Failed - Lipid Panel in normal range within the last 12 months    Cholesterol, Total  Date Value Ref Range Status  12/03/2022 225 (H) 100 - 199 mg/dL Final   LDL Chol Calc (NIH)  Date Value Ref Range Status  12/03/2022 124 (H) 0 - 99 mg/dL Final   HDL  Date Value Ref Range Status  12/03/2022 87 >39 mg/dL Final   Triglycerides  Date Value Ref Range Status  12/03/2022 80 0 - 149 mg/dL Final         Passed - Valid encounter within last 6 months    Recent Outpatient Visits           2 months ago Primary hypertension   [place] [facility] [place], [place], DO

## 2024-01-12 ENCOUNTER — Other Ambulatory Visit: Payer: Self-pay | Admitting: Family Medicine

## 2024-01-12 ENCOUNTER — Encounter: Payer: Self-pay | Admitting: Family Medicine

## 2024-01-12 NOTE — Progress Notes (Signed)
 "  [facility] at [facility] [address] [place], [place] [address]  Phone: [phone] Fax: [phone]  Date of Service: 01/13/2024  Patient Clinic Note  PCP: Referring Provider:  Vicci Duwaine SQUIBB, DO [address] [place] [address] Phone: [phone] Fax: [phone] Gayle Verneita Molt, FNP [address] [place],  [place] [address] Phone: [phone] Fax: [phone]    Assessment and Plan:   Casey Summers is a 78 y.o. female with past medical history of chronic migraines, HFpEF, hypertension who presents for cardiology evaluation.  Assessment & Plan Hyperlipidemia LDL cholesterol elevated at 153.  Attenuation CT and SPECT perfusion study 2024 notes no notable coronary artery calcium.  - Defer medical therapy at this time per patient preference  Heart failure with preserved ejection fraction HFpEF well-controlled, minimal medication, no recent exacerbations. - Continue Lasix  PRN for fluid retention. - No regular cardiology follow-up unless symptoms worsen.  Hypertension Blood pressure slightly elevated due to missed dose of losartan  50, generally well-controlled. - Continue current antihypertensive regimen.      Lab Results  Component Value Date   CHOL 247 (H) 07/29/2023   CHOL 157 04/29/2022   Lab Results  Component Value Date   HDL 76 07/29/2023   HDL 61 (H) 04/29/2022   Lab Results  Component Value Date   LDL 153 (H) 07/29/2023   LDL 74 04/29/2022   Lab Results  Component Value Date   VLDL 21.8 04/29/2022   Lab Results  Component Value Date   CHOLHDLRATIO 2.6 04/29/2022   Lab Results  Component Value Date   TRIG 99 07/29/2023   TRIG 109 04/29/2022    The 10-yr ASCVD Risk score Verdon DC Jr., et al., 2013) failed to calculate due to the following reason: The 2013 10-yr ASCVD risk score is only valid if the patient does not have prior/existing clinical ASCVD (myocardial infarction, stroke, CABG, coronary angioplasty, angina or peripheral arterial  disease, coronary atherosclerosis, ischemic heart disease, or cerebrovascular disease). The patient has prior/existing ASCVD. Has Peripheral Arterial Disease   No results found for: A1C   Return if symptoms worsen or fail to improve.     Subjective:   Chief Complaint: Chief Complaint  Patient presents with   Follow-up    Pt denies having any pain     Referring Provider: Gayle Verneita Molt, FNP  History of Present Illness:   Casey Summers is a 78 y.o. female, with past medical history of chronic migraines, HFpEF, hypertension who presents for cardiology evaluation.  History of Present Illness    Casey Summers is a 78 year old female with heart failure with preserved ejection fraction who presents for a cardiology evaluation.  She has heart failure with preserved ejection fraction (HFpEF) and experiences no recent episodes of chest pain, syncope, or significant swelling. She uses Jardiance  infrequently, approximately once every other week, and rarely uses Lasix , only as needed. No swelling in her legs, and she notes that her legs are 'skinny'.  She reports that she has not taken her blood pressure medication today. She generally manages her hypertension with Losartan .  She experiences frequent headaches, which she attributes to weather changes, and describes a sensation of 'electricity' when touching a specific spot on her head. The etiology of this sensation is unclear.  In her family history, she mentions a nephew's son who has diabetes and is using an inhaled insulin device, and her daughter was diagnosed with diabetes last year.  Socially, she is originally from  [place]  and currently resides in [place]. She is in the process of scheduling various medical appointments before the end of the year due to potential insurance changes.    Last visit ----- Patient notes increased dyspnea with exertion over the past several days.  She is now getting winded while eating,  having a discussion with somebody, or while carrying laundry around her house which are new over the past several days.  She notes she has taken her Lasix  about 3 times in the past month, has not noted any significant lower extremity swelling but notes her knees have been a little more swollen recently.  Notes no orthopnea or weight gain.  We discussed that her symptoms could be related to the beginnings of a HFpEF exacerbation and we will treat with low-dose Lasix  over the next several days to see if she improves.  No chest pain.   -----presenting HPI Patient states she is doing okay since leaving the hospital.  She denies chest pain with exertion, but does note progressive dyspnea with exertion.  She notes no syncope, orthopnea, weight gain, lower extremity swelling.  She recently had a temporal artery biopsy done as a workup for her chronic worsening headaches.  She has seen neurology and they are changing some of her medications around.     Cardiovascular History & Procedures: Cardiovascular Problems: HFpEF Hypertension   Cath / PCI: None  CV Surgery: None  EP Procedures and Devices: None  Non-Invasive Evaluation(s): independently reviewed the most recent study.  Echocardiogram 04/29/2022-normal biventricular size and function, no significant valvular abnormalities SPECT myocardial perfusion study 05/31/2022-normal myocardial perfusion, LVEF 60%, no coronary artery calcifications Echocardiogram 07/30/2023-LV normal size and function RV normal size and function no notable valvular abnormalities Zio patch 07/2023-no clearly significant arrhythmias or heart block  Medical History: Past Medical History:  Diagnosis Date   Breast cancer    (CMS-HCC)     Surgical History: Past Surgical History:  Procedure Laterality Date   BREAST BIOPSY Right    2011   BREAST LUMPECTOMY Right 2011   HYSTERECTOMY     OOPHORECTOMY     RADIATION     2011    Social History:  reports that  she has quit smoking. Her smoking use included cigarettes. She does not have any smokeless tobacco history on file. She reports current alcohol use of about 14.0 standard drinks of alcohol per week. She reports that she does not use drugs.  Family History: family history includes Breast cancer (age of onset: 64) in her sister; Cancer in her brother; Prostate cancer in her brother.  Review of Systems:  Except as noted in the HPI, the remainder of 10 systems reviewed is negative.   Allergies: Allergies  Allergen Reactions   Latex Rash   Nickel Rash    Medications:  Prior to Admission medications  Medication Dose, Route, Frequency  alendronate  (FOSAMAX ) 70 MG tablet 70 mg, Every 7 days  aspirin (ADULT LOW DOSE ASPIRIN) 81 MG tablet 81 mg, Daily (standard) Patient not taking: Reported on 09/10/2023  carBAMazepine  (TEGRETOL   XR) 100 MG 12 hr tablet 1 tablet, Daily (standard)  empagliflozin  (JARDIANCE ) 10 mg tablet 10 mg, Oral, Daily (standard)  furosemide  (LASIX ) 20 MG tablet 20 mg, Oral, Daily (standard)  gabapentin  (NEURONTIN ) 300 MG capsule 900 mg, Oral, 3 times a day (standard) Patient not taking: Reported on 09/10/2023  ibuprofen  (MOTRIN ) 800 MG tablet 800 mg, Every 8 hours PRN  losartan  (COZAAR ) 50 MG tablet 50 mg, Oral, Daily (standard)  omeprazole  (PRILOSEC) 20 MG capsule 20 mg, Daily (standard)  propranolol  (INDERAL  LA) 80 mg 24 hr capsule 80 mg, Daily (standard)  venlafaxine  (EFFEXOR -XR) 150 MG 24 hr capsule 150 mg, Daily (standard)     Objective:   Vitals BP 149/70 (BP Site: R Arm, BP Position: Sitting, BP Cuff Size: Large)   Pulse 71   Wt 53.6 kg (118 lb 1.6 oz)   SpO2 98%   BMI 23.06 kg/m    Wt Readings from Last 3 Encounters:  01/13/24 53.6 kg (118 lb 1.6 oz)  09/10/23 52.6 kg (115 lb 14.4 oz)  07/29/23 52 kg (114 lb 9.6 oz)    Physical Exam General:  Pleasant female sitting in chair in nad.  Neck: Supple, JVP normal.  Resp:   CTAB bilaterally with normal  WOB.  Cardio:  RRR without m/r/g.  Abdomen:   Soft, non-distended, non-tender.  Extremities: Warm well-perfused bilaterally. No edema .  MSK: No joint swelling or erythema. No gross deformities.  Skin: No rashes  Neuro: CN II-XII grossly intact. Strength grossly intact.   Psych: Alert and oriented x3. Appropriate mood.    ECG (01/13/24) - independently interpreted.  None today.  Last ECG in epic independently interpreted 07/29/2023-normal sinus rhythm normal axis prior inferior infarct, old overall unchanged from last ECG April 2024  Most Recent Labs  Lab Results  Component Value Date   NA 137 07/29/2023   K 5.0 (H) 07/29/2023   CL 102 07/29/2023   CO2 30.0 07/29/2023   Lab Results  Component Value Date   BUN 14 07/29/2023   BUN 13 04/29/2022   Lab Results  Component Value Date   Creatinine 0.68 07/29/2023   Creatinine 0.53 (L) 04/29/2022   No results found for: PROBNP Lab Results  Component Value Date   Cholesterol, Total 247 (H) 07/29/2023   Triglycerides 99 07/29/2023   Cholesterol, HDL 76 07/29/2023   Cholesterol, Non-HDL, Calculated 171 (H) 07/29/2023   Cholesterol, LDL, Calculated 153 (H) 07/29/2023     "

## 2024-01-13 ENCOUNTER — Ambulatory Visit
Admission: RE | Admit: 2024-01-13 | Discharge: 2024-01-13 | Disposition: A | Source: Ambulatory Visit | Attending: Acute Care | Admitting: Acute Care

## 2024-01-13 ENCOUNTER — Telehealth: Payer: Self-pay | Admitting: Acute Care

## 2024-01-13 DIAGNOSIS — Z122 Encounter for screening for malignant neoplasm of respiratory organs: Secondary | ICD-10-CM | POA: Diagnosis present

## 2024-01-13 DIAGNOSIS — Z87891 Personal history of nicotine dependence: Secondary | ICD-10-CM | POA: Diagnosis present

## 2024-01-13 NOTE — Telephone Encounter (Signed)
 Returned call from VM.  Patient left phone number but not reason for call.  She has LDCT appointment today.  Called but no answer.  Phone seems to have been disconnected after two rings.  Unable to leave message.

## 2024-01-15 NOTE — Telephone Encounter (Signed)
 Requested  by interface surescripts. Future visit 02/03/24.  Requested Prescriptions  Pending Prescriptions Disp Refills   alendronate  (FOSAMAX ) 70 MG tablet [Pharmacy Med Name: ALENDRONATE  SODIUM 70 MG TAB] 12 tablet 0    Sig: TAKE 1 TABLET EVERY 7 DAYS. TAKE WITH A FULL GLASS OF WATER ON AN EMPTY STOMACH.     Endocrinology:  Bisphosphonates Failed - 01/15/2024 11:37 AM      Failed - Ca in normal range and within 360 days    Calcium  Date Value Ref Range Status  12/03/2022 9.8 8.7 - 10.3 mg/dL Final         Failed - Vitamin D in normal range and within 360 days    Vit D, 25-Hydroxy  Date Value Ref Range Status  06/29/2020 76.6 30.0 - 100.0 ng/mL Final    Comment:    Vitamin D deficiency has been defined by the Institute of Medicine and an Endocrine Society practice guideline as a level of serum 25-OH vitamin D less than 20 ng/mL (1,2). The Endocrine Society went on to further define vitamin D insufficiency as a level between 21 and 29 ng/mL (2). 1. IOM (Institute of Medicine). 2010. Dietary reference    intakes for calcium and D. [place]: The    [employer]. 2. Holick MF, Binkley [place], Bischoff-Ferrari HA, et al.    Evaluation, treatment, and prevention of vitamin D    deficiency: an Endocrine Society clinical practice    guideline. JCEM. 2011 Jul; 96(7):1911-30.          Failed - Cr in normal range and within 360 days    Creatinine  Date Value Ref Range Status  02/22/2019 116.9 20.0 - 300.0 mg/dL Final   Creatinine, Ser  Date Value Ref Range Status  12/03/2022 0.66 0.57 - 1.00 mg/dL Final         Failed - Mg Level in normal range and within 360 days    No results found for: MG       Failed - Phosphate in normal range and within 360 days    No results found for: PHOS       Failed - eGFR is 30 or above and within 360 days    GFR calc Af Amer  Date Value Ref Range Status  10/12/2019 >60 >60 mL/min Final   GFR calc non Af Amer  Date Value Ref  Range Status  10/12/2019 >60 >60 mL/min Final   eGFR  Date Value Ref Range Status  12/03/2022 91 >59 mL/min/1.73 Final         Failed - Bone Mineral Density or Dexa Scan completed in the last 2 years      Passed - Valid encounter within last 12 months    Recent Outpatient Visits           3 months ago Primary hypertension   [place] [facility] [place], [place], DO

## 2024-01-16 ENCOUNTER — Telehealth: Payer: Self-pay | Admitting: Family Medicine

## 2024-01-16 LAB — HM DEXA SCAN

## 2024-01-16 LAB — HM MAMMOGRAPHY

## 2024-01-16 NOTE — Telephone Encounter (Signed)
 Contacted patient to inform her the radiologist is recommending diagnostic breast imaging. I explained the reason for the recommendation and answered all patient questions to the best of my ability. The patient is aware that an order request has been sent to her provider and once the orders are received, a member of the breast imaging team will call her to schedule the appointment. Patient verbalized understanding.

## 2024-01-16 NOTE — Telephone Encounter (Signed)
 I do not see any messages or notes on this encounter. Will be closed

## 2024-01-19 ENCOUNTER — Telehealth: Payer: Self-pay

## 2024-01-19 ENCOUNTER — Telehealth: Payer: Self-pay | Admitting: Family Medicine

## 2024-01-19 ENCOUNTER — Encounter: Payer: Self-pay | Admitting: Family Medicine

## 2024-01-19 ENCOUNTER — Other Ambulatory Visit: Payer: Self-pay

## 2024-01-19 DIAGNOSIS — R160 Hepatomegaly, not elsewhere classified: Secondary | ICD-10-CM

## 2024-01-19 DIAGNOSIS — Z87891 Personal history of nicotine dependence: Secondary | ICD-10-CM

## 2024-01-19 DIAGNOSIS — Z122 Encounter for screening for malignant neoplasm of respiratory organs: Secondary | ICD-10-CM

## 2024-01-19 NOTE — Addendum Note (Signed)
 Addended by: VICCI DUWAINE SQUIBB on: 01/19/2024 10:13 AM   Modules accepted: Orders

## 2024-01-19 NOTE — Telephone Encounter (Signed)
 Call Report from Tiffany  IMPRESSION: 1. Lung-RADS 2, benign appearance or behavior. Continue annual screening with low-dose chest CT without contrast in 12 months. 2. Somewhat heterogeneous and circumscribed low-attenuation mass in the peripheral right hepatic lobe has enlarged from MR abdomen 08/05/2017. Recommend repeat MR abdomen without and with contrast in further evaluation as malignancy cannot be excluded. These results will be called to the ordering clinician or representative by the Radiologist Assistant, and communication documented in the PACS or [employer]. 3.  Aortic atherosclerosis (ICD10-I70.0). 4.  Emphysema (ICD10-J43.9).

## 2024-01-19 NOTE — Telephone Encounter (Signed)
 Called patient and reviewed recent Lung CT results. She will complete an annual Lung CT again next year. Order placed. She is aware that she had a liver lesion that was evaluated in 2019. Advised there was growth noted on the recent Lung screening and radiology recommended additional imaging. Pt states she will see Dr. Vicci 02/03/2024 for a physical and will discuss additional imaging at that time.   Dr. Laymond have been forwarded to your office per patient request. I advised the patient I would ask your office to please reach out to the patient prior to her physical if you feel follow up imaging is needed prior to appt in January. Please let us  know if we can assist you any further with this patient.   Isaiah, RN

## 2024-01-19 NOTE — Telephone Encounter (Signed)
Called and notified patient of Dr. Johnson's message.  

## 2024-01-19 NOTE — Telephone Encounter (Signed)
 Copied from CRM #[id number]. Topic: General - Other >> Jan 19, 2024  8:42 AM Kevelyn M wrote: Reason for CRM: Patient calling back again because she is very concerned because she's had breast cancer in this breast before.  Patient was called with the results from physical exam today- waiting for a referral to be approved for a second mammograph to [facility] - needs before the end of the year   Please call the patient back at [phone] with an update

## 2024-01-19 NOTE — Telephone Encounter (Signed)
 Called over to [facility] to see what needed to be done for the patient. Spoke with Aldona and she states that we just need to send over the order for the diagnostic mammogram. She states she will then send the order to centralized scheduling and they will contact the patient to be scheduled.   Called and notified patient of the above. Advised patient that order has been sent and that she should be expecting a call to be scheduled.

## 2024-01-19 NOTE — Telephone Encounter (Signed)
 Please let patient know I put the order in to check on her liver and we'll hopefully talk about the results when I see her in the new year. They'll call her to schedule it. Thanks!

## 2024-01-20 NOTE — Telephone Encounter (Unsigned)
 Copied from CRM #[id number]. Topic: Clinical - Request for Lab/Test Order >> Jan 20, 2024  8:29 AM Myrick T wrote: Reason for CRM: Apolinar from [facility] [phone] called stated they ordered a right diagnostic bilateral but it needs to be changed to right only. Please f/u with Wanda

## 2024-01-20 NOTE — Telephone Encounter (Signed)
 Apolinar with [facility] calling to request correct order to be sent in for right diagnostic mammogram and ultrasound. Current order they have is for bilateral diagnostic mammogram and ultrasound.

## 2024-01-20 NOTE — Telephone Encounter (Signed)
 Order form for the breast imaging was received, signed by Dr. Vicci, and faxed back to [facility].

## 2024-01-20 NOTE — Telephone Encounter (Signed)
 [facility] requesting callback, [phone]

## 2024-01-20 NOTE — Telephone Encounter (Signed)
 Called and LVM asking for Apolinar to please return my call.

## 2024-01-21 ENCOUNTER — Encounter: Payer: Self-pay | Admitting: Family Medicine

## 2024-01-27 ENCOUNTER — Ambulatory Visit
Admission: RE | Admit: 2024-01-27 | Discharge: 2024-01-27 | Disposition: A | Discharge status: Home / Self Care | Source: Ambulatory Visit | Attending: Family Medicine | Admitting: Family Medicine

## 2024-01-27 DIAGNOSIS — R160 Hepatomegaly, not elsewhere classified: Secondary | ICD-10-CM | POA: Diagnosis present

## 2024-01-27 MED ORDER — GADOBUTROL 1 MMOL/ML IV SOLN
5.0000 mL | Freq: Once | INTRAVENOUS | Status: AC | PRN
  Administered 2024-01-27: 5 mL via INTRAVENOUS

## 2024-01-28 ENCOUNTER — Ambulatory Visit: Payer: Self-pay | Admitting: Family Medicine

## 2024-01-28 LAB — HM MAMMOGRAPHY

## 2024-02-03 ENCOUNTER — Encounter: Admitting: Family Medicine

## 2024-02-04 ENCOUNTER — Encounter: Payer: Self-pay | Admitting: Family Medicine

## 2024-02-06 ENCOUNTER — Ambulatory Visit
Admission: EM | Admit: 2024-02-06 | Discharge: 2024-02-06 | Disposition: A | Discharge status: Home / Self Care | Attending: Family Medicine | Admitting: Family Medicine

## 2024-02-06 DIAGNOSIS — L409 Psoriasis, unspecified: Secondary | ICD-10-CM

## 2024-02-06 MED ORDER — CLOBETASOL PROPIONATE 0.05 % EX OINT: 1.0000 | TOPICAL_OINTMENT | Freq: Two times a day (BID) | CUTANEOUS | 0 refills | Status: AC

## 2024-02-06 MED ORDER — DEXAMETHASONE SOD PHOSPHATE PF 10 MG/ML IJ SOLN
10.0000 mg | Freq: Once | INTRAMUSCULAR | Status: AC
  Administered 2024-02-06: 10 mg via INTRAMUSCULAR

## 2024-02-06 NOTE — ED Triage Notes (Signed)
 Pt c/o rash x2weeks  Pt states that the rash is along her abdomen and under her breasts  Pt states that she has psoriasis

## 2024-02-06 NOTE — Discharge Instructions (Addendum)
 You are having a flare up of your psoriasis.  You were given a steroid injection today.  Apply the steroid ointment twice a day.   Stop by the pharmacy to pick up your prescriptions.  Follow up with your primary care provider or return to the urgent care, if not improving.

## 2024-02-06 NOTE — ED Provider Notes (Signed)
 " [facility]    CSN: [id number] Arrival date & time: 02/06/24  1218      History   Chief Complaint Chief Complaint  Patient presents with   Rash    HPI Casey Summers is a 79 y.o. female.   HPI  Casey Summers presents for rash for the past 2 weeks on her abdomen and under her breasts. The rash started on her abdomen and spread up to her bra line. Has scales in her ears and patches on her head.  She has history of psoriasis.     There is been no new products including soaps and detergents.  No eye irritation, sore throat, difficulty breathing, nausea, vomiting or diarrhea.  Denies belly pain, joint pain and fever.  There has been no medication changes or new supplements.  Denies any new foods or drinks.   Notes she has had increased stress related to the death of her aunt and resulting family tensions.    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   Congestive heart failure (HCC) 05/02/2022   Chronic intractable headache 04/01/2022   Trigeminal neuralgia of left side of face 04/01/2022   Age-related osteoporosis without current pathological fracture 06/29/2020   Aortic atherosclerosis 09/09/2017   Liver hemangioma 08/12/2017   Depression 05/29/2017   Advanced care planning/counseling discussion 08/22/2016   S/P TAH-BSO 08/22/2016   Low back pain 11/28/2014   Hypertension     Past Surgical History:  Procedure Laterality Date   BREAST LUMPECTOMY  01/29/2008   TOTAL ABDOMINAL HYSTERECTOMY W/ BILATERAL SALPINGOOPHORECTOMY      OB History   No obstetric history on file.      Home Medications    Prior to Admission medications  Medication Sig Start Date End Date Taking? Authorizing Provider   alendronate  (FOSAMAX ) 70 MG tablet TAKE 1 TABLET EVERY 7 DAYS. TAKE WITH A FULL GLASS OF WATER ON AN EMPTY STOMACH. 01/15/24  Yes Johnson, Megan P, DO  clobetasol  ointment (TEMOVATE ) 0.05 % Apply 1 Application topically 2 (two) times daily. Do not use for more than 1 week at a time. 02/06/24  Yes Raahil Ong, DO  losartan  (COZAAR ) 50 MG tablet Take 1 tablet (50 mg total) by mouth daily. Per Cardiologist 10/13/23  Yes Vicci, Megan P, DO  omeprazole  (PRILOSEC) 20 MG capsule Take 1 capsule (20 mg total) by mouth daily. 10/13/23  Yes Johnson, Megan P, DO  propranolol  ER (INDERAL  LA) 80 MG 24 hr capsule Take 1 capsule (80 mg total) by mouth daily. 10/13/23  Yes Johnson, Megan P, DO  venlafaxine  XR (EFFEXOR  XR) 150 MG 24 hr capsule Take 1 capsule (150 mg total) by mouth daily with breakfast. 10/13/23  Yes Johnson, Megan P, DO  venlafaxine  XR (EFFEXOR -XR) 75 MG 24 hr capsule TAKE 1 CAPSULE (75 MG TOTAL) BY MOUTH DAILY WITH BREAKFAST. TAKE WITH 150MG  FOR 225MG  TOTAL DAILY 01/09/24  Yes Johnson, Megan P, DO  empagliflozin  (JARDIANCE ) 10 MG TABS tablet Take 1 tablet (10 mg total) by mouth daily. Patient not taking: Reported on 10/13/2023 12/03/22   Vicci Duwaine SQUIBB, DO    Family History Family History  Problem Relation Age of  Onset   Congestive Heart Failure Mother    Cancer Sister        breast   Diabetes Daughter     Social History Social History[1]   Allergies   Latex and Nickel   Review of Systems Review of Systems :negative unless otherwise stated in HPI.      Physical Exam Triage Vital Signs ED Triage Vitals  Encounter Vitals Group     BP 02/06/24 1229 116/68     Girls Systolic BP Percentile --      Girls Diastolic BP Percentile --      Boys Systolic BP Percentile --      Boys Diastolic BP Percentile --      Pulse Rate 02/06/24 1229 80     Resp 02/06/24 1229 16     Temp 02/06/24 1229 98.1 F (36.7 C)     Temp Source 02/06/24 1229 Oral     SpO2 02/06/24 1229 100 %      Weight 02/06/24 1227 117 lb (53.1 kg)     Height --      Head Circumference --      Peak Flow --      Pain Score 02/06/24 1227 0     Pain Loc --      Pain Education --      Exclude from Growth Chart --    No data found.  Updated Vital Signs BP 116/68 (BP Location: Left Arm)   Pulse 80   Temp 98.1 F (36.7 C) (Oral)   Resp 16   Wt 53.1 kg   SpO2 100%   BMI 22.85 kg/m   Visual Acuity Right Eye Distance:   Left Eye Distance:   Bilateral Distance:    Right Eye Near:   Left Eye Near:    Bilateral Near:     Physical Exam  GEN: alert, well appearing female, in no acute distress  EYES: no scleral injection or discharge RESP: no increased work of breathing NEURO: alert, moves all extremities appropriately SKIN: warm and dry; silvery erythematous plaques in the scalp and external ear, under breasts, abdomen and back      UC Treatments / Results  Labs (all labs ordered are listed, but only abnormal results are displayed) Labs Reviewed - No data to display  EKG   Radiology No results found.  Procedures Procedures (including critical care time)  Medications Ordered in UC Medications  dexamethasone  (DECADRON ) injection 10 mg (10 mg Intramuscular Given 02/06/24 1258)    Initial Impression / Assessment and Plan / UC Course  I have reviewed the triage vital signs and the nursing notes.  Pertinent labs & imaging results that were available during my care of the patient were reviewed by me and considered in my medical decision making (see chart for details).     Patient is a 79 y.o. femalewho presents for rash.  Overall, patient is well-appearing and well-hydrated.  Vital signs stable.  Casey Summers is afebrile.  Exam concerning for psoriasis flare up. Discussed steroid options.  Treat with Decadron  30 mg IM as pt doesn't want to take oral steroids. She is agreeable to steroid ointment.  No sign of infection to suggest antibiotics or antifungals at this time.  Not likely  viral exanthem.     Reviewed expectations regarding course of current medical issues.  All questions asked were answered.  Outlined signs and symptoms indicating need for more acute intervention. Patient verbalized understanding. After Visit Summary given.   Final Clinical Impressions(s) /  UC Diagnoses   Final diagnoses:  Psoriasis     Discharge Instructions      You are having a flare up of your psoriasis.  You were given a steroid injection today.  Apply the steroid ointment twice a day.   Stop by the pharmacy to pick up your prescriptions.  Follow up with your primary care provider or return to the urgent care, if not improving.       ED Prescriptions     Medication Sig Dispense Auth. Provider   clobetasol  ointment (TEMOVATE ) 0.05 % Apply 1 Application topically 2 (two) times daily. Do not use for more than 1 week at a time. 60 g Ekin Pilar, DO      PDMP not reviewed this encounter.               [1]  Social History Tobacco Use   Smoking status: Former    Current packs/day: 0.00    Average packs/day: 1.3 packs/day for 24.0 years (30.0 ttl pk-yrs)    Types: Cigarettes    Start date: 08/28/1984    Quit date: 08/28/2008    Years since quitting: 15.4   Smokeless tobacco: Never  Vaping Use   Vaping status: Never Used  Substance Use Topics   Alcohol use: Yes    Alcohol/week: 14.0 standard drinks of alcohol    Types: 14 Glasses of wine per week   Drug use: No     Kriste Berth, DO 02/06/24 1625  "

## 2024-02-12 ENCOUNTER — Ambulatory Visit

## 2024-02-12 ENCOUNTER — Encounter: Payer: Self-pay | Admitting: Family Medicine

## 2024-02-12 VITALS — Ht 60.0 in | Wt 115.0 lb

## 2024-02-12 DIAGNOSIS — Z0000 Encounter for general adult medical examination without abnormal findings: Secondary | ICD-10-CM

## 2024-02-12 NOTE — Progress Notes (Signed)
 "  Chief Complaint  Patient presents with   Medicare Wellness     Subjective:   Casey Summers is a 79 y.o. female who presents for a Medicare Annual Wellness Visit.  Visit info / Clinical Intake: Medicare Wellness Visit Type:: Subsequent Annual Wellness Visit Persons participating in visit and providing information:: patient Medicare Wellness Visit Mode:: Telephone If telephone:: video declined Since this visit was completed virtually, some vitals may be partially provided or unavailable. Missing vitals are due to the limitations of the virtual format.: Documented vitals are patient reported If Telephone or Video please confirm:: I connected with patient using audio/video enable telemedicine. I verified patient identity with two identifiers, discussed telehealth limitations, and patient agreed to proceed. Patient Location:: home Provider Location:: clinic office Interpreter Needed?: No Pre-visit prep was completed: yes AWV questionnaire completed by patient prior to visit?: no Living arrangements:: with family/others Patient's Overall Health Status Rating: very good Typical amount of pain: some Does pain affect daily life?: (!) yes Are you currently prescribed opioids?: (!) yes  Dietary Habits and Nutritional Risks How many meals a day?: (!) 0 (grazes all day) Eats fruit and vegetables daily?: yes Most meals are obtained by: preparing own meals In the last 2 weeks, have you had any of the following?: none Diabetic:: no  Functional Status Activities of Daily Living (to include ambulation/medication): Independent Ambulation: Independent with device- listed below Home Assistive Devices/Equipment: Eyeglasses Medication Administration: Independent Home Management (perform basic housework or laundry): Independent Manage your own finances?: yes Primary transportation is: driving Concerns about vision?: no *vision screening is required for WTM* Concerns about hearing?:  no  Fall Screening Falls in the past year?: 0 Number of falls in past year: 0 Was there an injury with Fall?: 0 Fall Risk Category Calculator: 0 Patient Fall Risk Level: Low Fall Risk  Fall Risk Patient at Risk for Falls Due to: No Fall Risks Fall risk Follow up: Falls evaluation completed  Home and Transportation Safety: All rugs have non-skid backing?: yes All stairs or steps have railings?: yes Grab bars in the bathtub or shower?: (!) no Have non-skid surface in bathtub or shower?: yes Good home lighting?: yes Regular seat belt use?: yes Hospital stays in the last year:: no  Cognitive Assessment Difficulty concentrating, remembering, or making decisions? : no Will 6CIT or Mini Cog be Completed: yes What year is it?: 0 points What month is it?: 0 points Give patient an address phrase to remember (5 components): [address] [place] About what time is it?: 0 points Count backwards from 20 to 1: 0 points Say the months of the year in reverse: 0 points Repeat the address phrase from earlier: 0 points 6 CIT Score: 0 points  Advance Directives (For Healthcare) Does Patient Have a Medical Advance Directive?: No Would patient like information on creating a medical advance directive?: Yes (MAU/Ambulatory/Procedural Areas - Information given)  Reviewed/Updated  Reviewed/Updated: Reviewed All (Medical, Surgical, Family, Medications, Allergies, Care Teams, Patient Goals)    Allergies (verified) Latex and Nickel   Current Medications (verified) Outpatient Encounter Medications as of 02/12/2024  Medication Sig   alendronate  (FOSAMAX ) 70 MG tablet TAKE 1 TABLET EVERY 7 DAYS. TAKE WITH A FULL GLASS OF WATER ON AN EMPTY STOMACH.   clobetasol  ointment (TEMOVATE ) 0.05 % Apply 1 Application topically 2 (two) times daily. Do not use for more than 1 week at a time.   losartan  (COZAAR ) 50 MG tablet Take 1 tablet (50 mg total) by mouth  daily. Per Cardiologist   omeprazole  (PRILOSEC)  20 MG capsule Take 1 capsule (20 mg total) by mouth daily.   propranolol  ER (INDERAL  LA) 80 MG 24 hr capsule Take 1 capsule (80 mg total) by mouth daily.   traMADol  (ULTRAM ) 50 MG tablet Take 50 mg by mouth every 6 (six) hours as needed.   venlafaxine  XR (EFFEXOR  XR) 150 MG 24 hr capsule Take 1 capsule (150 mg total) by mouth daily with breakfast.   empagliflozin  (JARDIANCE ) 10 MG TABS tablet Take 1 tablet (10 mg total) by mouth daily. (Patient not taking: Reported on 02/12/2024)   venlafaxine  XR (EFFEXOR -XR) 75 MG 24 hr capsule TAKE 1 CAPSULE (75 MG TOTAL) BY MOUTH DAILY WITH BREAKFAST. TAKE WITH 150MG  FOR 225MG  TOTAL DAILY (Patient not taking: Reported on 02/12/2024)   No facility-administered encounter medications on file as of 02/12/2024.    History: Past Medical History:  Diagnosis Date   Allergy    Cancer (HCC)    breast   Chronic mid back pain    Depression    Hypertension    Osteoporosis    Past Surgical History:  Procedure Laterality Date   BREAST LUMPECTOMY  01/29/2008   TOTAL ABDOMINAL HYSTERECTOMY W/ BILATERAL SALPINGOOPHORECTOMY     Family History  Problem Relation Age of Onset   Congestive Heart Failure Mother    Cancer Sister        breast   Diabetes Daughter    Social History   Occupational History   Occupation: retired  Tobacco Use   Smoking status: Former    Current packs/day: 0.00    Average packs/day: 1.3 packs/day for 24.0 years (30.0 ttl pk-yrs)    Types: Cigarettes    Start date: 08/28/1984    Quit date: 08/28/2008    Years since quitting: 15.4   Smokeless tobacco: Never  Vaping Use   Vaping status: Never Used  Substance and Sexual Activity   Alcohol use: Yes    Alcohol/week: 14.0 standard drinks of alcohol    Types: 14 Glasses of wine per week    Comment: 2 glasses of wine nightly   Drug use: No   Sexual activity: Not Currently   Tobacco Counseling Counseling given: Not Answered  SDOH Screenings   Food Insecurity: No Food Insecurity  (02/12/2024)  Housing: Low Risk (02/12/2024)  Transportation Needs: No Transportation Needs (02/12/2024)  Utilities: Not At Risk (02/12/2024)  Alcohol Screen: Low Risk (11/12/2021)  Depression (PHQ2-9): Low Risk (02/12/2024)  Financial Resource Strain: Low Risk (04/29/2022)   Received from [facility]  Physical Activity: Insufficiently Active (02/12/2024)  Social Connections: Moderately Isolated (02/12/2024)  Stress: No Stress Concern Present (02/12/2024)  Tobacco Use: Medium Risk (02/12/2024)  Health Literacy: Adequate Health Literacy (02/12/2024)   See flowsheets for full screening details  Depression Screen PHQ 2 & 9 Depression Scale- Over the past 2 weeks, how often have you been bothered by any of the following problems? Little interest or pleasure in doing things: 0 Feeling down, depressed, or hopeless (PHQ Adolescent also includes...irritable): 1 PHQ-2 Total Score: 1 Trouble falling or staying asleep, or sleeping too much: 1 Feeling tired or having little energy: 0 Poor appetite or overeating (PHQ Adolescent also includes...weight loss): 1 Feeling bad about yourself - or that you are a failure or have let yourself or your family down: 0 Trouble concentrating on things, such as reading the newspaper or watching television (PHQ Adolescent also includes...like school work): 0 Moving or speaking so slowly that other people  could have noticed. Or the opposite - being so fidgety or restless that you have been moving around a lot more than usual: 0 Thoughts that you would be better off dead, or of hurting yourself in some way: 0 PHQ-9 Total Score: 3 If you checked off any problems, how difficult have these problems made it for you to do your work, take care of things at home, or get along with other people?: Not difficult at all  Depression Treatment Depression Interventions/Treatment : [id number] Score <4 Follow-up Not Indicated; Currently on Treatment     Goals Addressed                This Visit's Progress     Increase physical activity, walk more (pt-stated)               Objective:    Today's Vitals   02/12/24 1355  Weight: 115 lb (52.2 kg)  Height: 5' (1.524 m)   Body mass index is 22.46 kg/m.  Hearing/Vision screen Hearing Screening - Comments:: Denies hearing loss  Vision Screening - Comments:: UTD w/ Dr. Lynwood Hasting @ [facility] [place] Immunizations and Health Maintenance Health Maintenance  Topic Date Due   Zoster Vaccines- Shingrix (1 of 2) 08/28/1966   Influenza Vaccine  04/27/2024 (Originally 08/29/2023)   Mammogram  01/27/2025   Medicare Annual Wellness (AWV)  02/11/2025   DTaP/Tdap/Td (3 - Td or Tdap) 07/18/2025   Bone Density Scan  01/15/2026   Pneumococcal Vaccine: 50+ Years  Completed   Hepatitis C Screening  Completed   Meningococcal B Vaccine  Aged Out   Lung Cancer Screening  Discontinued   Colonoscopy  Discontinued   COVID-19 Vaccine  Discontinued        Assessment/Plan:  This is a routine wellness examination for [place].  Patient Care Team: Vicci Duwaine SQUIBB, DO as PCP - General (Family Medicine) Hasting Lynwood, OD (Optometry) Jama Margery ORN, MD as Referring Physician (Cardiology)  I have personally reviewed and noted the following in the patients chart:   Medical and social history Use of alcohol, tobacco or illicit drugs  Current medications and supplements including opioid prescriptions. Functional ability and status Nutritional status Physical activity Advanced directives List of other physicians Hospitalizations, surgeries, and ER visits in previous 12 months Vitals Screenings to include cognitive, depression, and falls Referrals and appointments  No orders of the defined types were placed in this encounter.  In addition, I have reviewed and discussed with patient certain preventive protocols, quality metrics, and best practice recommendations. A written personalized care plan for preventive services as well as  general preventive health recommendations were provided to patient.   Vina Ned, CMA   02/12/2024   Return in 53 weeks (on 02/17/2025) for Medicare Annual Wellness Visit.  After Visit Summary: (MyChart) Due to this being a telephonic visit, the after visit summary with patients personalized plan was offered to patient via MyChart   Nurse Notes:  RS cancelled CPE appt from 02/03/24 to 02/19/24 Needs shingles vaccines (pharmacy) Declined flu and covid vaccines "

## 2024-02-12 NOTE — Patient Instructions (Signed)
 [name],  Thank you for taking the time for your Medicare Wellness Visit. I appreciate your continued commitment to your health goals. Please review the care plan we discussed, and feel free to reach out if I can assist you further.  Please note that Annual Wellness Visits do not include a physical exam. Some assessments may be limited, especially if the visit was conducted virtually. If needed, we may recommend an in-person follow-up with your provider.  Ongoing Care Seeing your primary care provider every 3 to 6 months helps us  monitor your health and provide consistent, personalized care. I have scheduled you an appointment for your physical on 02/19/24 @ 8:00am with Dr. Vicci.  Referrals If a referral was made during today's visit and you haven't received any updates within two weeks, please contact the referred provider directly to check on the status.  Recommended Screenings:  You may get the shingles vaccines at your local pharmacy at your convenience.   Health Maintenance  Topic Date Due   Zoster (Shingles) Vaccine (1 of 2) 08[birth year]28/1966   Medicare Annual Wellness Visit  12/03/2023   Flu Shot  04/27/2024*   Breast Cancer Screening  01/27/2025   DTaP/Tdap/Td vaccine (3 - Td or Tdap) 07/18/2025   Osteoporosis screening with Bone Density Scan  01/15/2026   Pneumococcal Vaccine for age over 23  Completed   Hepatitis C Screening  Completed   Meningitis B Vaccine  Aged Out   Screening for Lung Cancer  Discontinued   Colon Cancer Screening  Discontinued   COVID-19 Vaccine  Discontinued  *Topic was postponed. The date shown is not the original due date.       02/12/2024    2:01 PM  Advanced Directives  Does Patient Have a Medical Advance Directive? No  Would patient like information on creating a medical advance directive? Yes (MAU/Ambulatory/Procedural Areas - Information given)   Information on Advanced Care Planning can be found at [place]  Secretary of [facility]  Advance Health Care Directives Advance Health Care Directives ([email or web address]  You may also get the forms at your doctor's office.  Vision: Annual vision screenings are recommended for early detection of glaucoma, cataracts, and diabetic retinopathy. These exams can also reveal signs of chronic conditions such as diabetes and high blood pressure.  Dental: Annual dental screenings help detect early signs of oral cancer, gum disease, and other conditions linked to overall health, including heart disease and diabetes.  Please see the attached documents for additional preventive care recommendations.    Managing Pain Without Opioids Opioids are strong medicines used to treat moderate to severe pain. For some people, especially those who have long-term (chronic) pain, opioids may not be the best choice for pain management due to: Side effects like nausea, constipation, and sleepiness. The risk of addiction (opioid use disorder). The longer you take opioids, the greater your risk of addiction. Pain that lasts for more than 3 months is called chronic pain. Managing chronic pain usually requires more than one approach and is often provided by a team of health care providers working together (multidisciplinary approach). Pain management may be done at a pain management center or pain clinic. How to manage pain without the use of opioids Use non-opioid medicines Non-opioid medicines for pain may include: Over-the-counter or prescription non-steroidal anti-inflammatory drugs (NSAIDs). These may be the first medicines used for pain. They work well for muscle and bone pain, and they reduce swelling. Acetaminophen . This over-the-counter medicine may work well for milder pain but not  swelling. Antidepressants. These may be used to treat chronic pain. A certain type of antidepressant (tricyclics) is often used. These medicines are given in lower doses for pain than when used for depression. Anticonvulsants. These  are usually used to treat seizures but may also reduce nerve (neuropathic) pain. Muscle relaxants. These relieve pain caused by sudden muscle tightening (spasms). You may also use a pain medicine that is applied to the skin as a patch, cream, or gel (topical analgesic), such as a numbing medicine. These may cause fewer side effects than medicines taken by mouth. Do certain therapies as directed Some therapies can help with pain management. They include: Physical therapy. You will do exercises to gain strength and flexibility. A physical therapist may teach you exercises to move and stretch parts of your body that are weak, stiff, or painful. You can learn these exercises at physical therapy visits and practice them at home. Physical therapy may also involve: Massage. Heat wraps or applying heat or cold to affected areas. Electrical signals that interrupt pain signals (transcutaneous electrical nerve stimulation, TENS). Weak lasers that reduce pain and swelling (low-level laser therapy). Signals from your body that help you learn to regulate pain (biofeedback). Occupational therapy. This helps you to learn ways to function at home and work with less pain. Recreational therapy. This involves trying new activities or hobbies, such as a physical activity or drawing. Mental health therapy, including: Cognitive behavioral therapy (CBT). This helps you learn coping skills for dealing with pain. Acceptance and commitment therapy (ACT) to change the way you think and react to pain. Relaxation therapies, including muscle relaxation exercises and mindfulness-based stress reduction. Pain management counseling. This may be individual, family, or group counseling.  Receive medical treatments Medical treatments for pain management include: Nerve block injections. These may include a pain blocker and anti-inflammatory medicines. You may have injections: Near the spine to relieve chronic back or neck  pain. Into joints to relieve back or joint pain. Into nerve areas that supply a painful area to relieve body pain. Into muscles (trigger point injections) to relieve some painful muscle conditions. A medical device placed near your spine to help block pain signals and relieve nerve pain or chronic back pain (spinal cord stimulation device). Acupuncture. Follow these instructions at home Medicines Take over-the-counter and prescription medicines only as told by your health care provider. If you are taking pain medicine, ask your health care providers about possible side effects to watch out for. Do not drive or use heavy machinery while taking prescription opioid pain medicine. Lifestyle  Do not use drugs or alcohol to reduce pain. If you drink alcohol, limit how much you have to: 0-1 drink a day for women who are not pregnant. 0-2 drinks a day for men. Know how much alcohol is in a drink. In the [place] one drink equals one 12 oz bottle of beer (355 mL), one 5 oz glass of wine (148 mL), or one 1 oz glass of hard liquor (44 mL). Do not use any products that contain nicotine or tobacco. These products include cigarettes, chewing tobacco, and vaping devices, such as e-cigarettes. If you need help quitting, ask your health care provider. Eat a healthy diet and maintain a healthy weight. Poor diet and excess weight may make pain worse. Eat foods that are high in fiber. These include fresh fruits and vegetables, whole grains, and beans. Limit foods that are high in fat and processed sugars, such as fried and sweet foods. Exercise regularly. Exercise  lowers stress and may help relieve pain. Ask your health care provider what activities and exercises are safe for you. If your health care provider approves, join an exercise class that combines movement and stress reduction. Examples include yoga and tai chi. Get enough sleep. Lack of sleep may make pain worse. Lower stress as much as possible.  Practice stress reduction techniques as told by your therapist. General instructions Work with all your pain management providers to find the treatments that work best for you. You are an important member of your pain management team. There are many things you can do to reduce pain on your own. Consider joining an online or in-person support group for people who have chronic pain. Keep all follow-up visits. This is important. Where to find more information You can find more information about managing pain without opioids from: American Academy of Pain Medicine: painmed.org Institute for Chronic Pain: instituteforchronicpain.org American Chronic Pain Association: theacpa.org Contact a health care provider if: You have side effects from pain medicine. Your pain gets worse or does not get better with treatments or home therapy. You are struggling with anxiety or depression. Summary Many types of pain can be managed without opioids. Chronic pain may respond better to pain management without opioids. Pain is best managed when you and a team of health care providers work together. Pain management without opioids may include non-opioid medicines, medical treatments, physical therapy, mental health therapy, and lifestyle changes. Tell your health care providers if your pain gets worse or is not being managed well enough. This information is not intended to replace advice given to you by your health care provider. Make sure you discuss any questions you have with your health care provider. Document Revised: 04/26/2020 Document Reviewed: 04/26/2020 Elsevier Patient Education  2024 [employer].

## 2024-02-19 ENCOUNTER — Encounter: Admitting: Family Medicine

## 2024-03-04 ENCOUNTER — Ambulatory Visit
Admission: EM | Admit: 2024-03-04 | Discharge: 2024-03-04 | Disposition: A | Discharge status: Home / Self Care | Source: Home / Self Care

## 2024-03-04 DIAGNOSIS — L304 Erythema intertrigo: Secondary | ICD-10-CM

## 2024-03-04 MED ORDER — CLOTRIMAZOLE 1 % EX CREA: TOPICAL_CREAM | CUTANEOUS | 0 refills | Status: AC

## 2024-03-04 NOTE — ED Provider Notes (Signed)
 " [facility]    CSN: [id number] Arrival date & time: 03/04/24  1339      History   Chief Complaint Chief Complaint  Patient presents with   Rash    HPI Casey Summers is a 79 y.o. female with history of psoriasis presents for rash of abdomen under lower abdominal fold. She is not sure how long the rash has been present. Reports the rash weeps sometimes and has an odor.   There has been no new products including soaps, body washes and detergents. No eye irritation, sore throat, difficulty breathing, nausea, vomiting or diarrhea. Denies abdominal pain, arthralgias, and fever. There has been no medication changes or new supplements. Denies any new foods or beverages.   Patient was seen in this department last month for psoriasis flare. She was treated with clobetasol  topical and dexamethasone  IM injection. She says the psoriasis rash has mostly cleared up.  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   Congestive heart failure (HCC) 05/02/2022   Chronic intractable headache 04/01/2022   Trigeminal neuralgia of left side of face 04/01/2022   Age-related osteoporosis without current pathological fracture 06/29/2020   Aortic atherosclerosis 09/09/2017   Liver hemangioma 08/12/2017   Depression 05/29/2017   Advanced care planning/counseling discussion 08/22/2016   S/P TAH-BSO 08/22/2016   Low back pain 11/28/2014   Hypertension     Past Surgical History:  Procedure Laterality Date   BREAST LUMPECTOMY  01/29/2008   TOTAL ABDOMINAL HYSTERECTOMY W/ BILATERAL SALPINGOOPHORECTOMY      OB History   No obstetric history on file.      Home Medications    Prior to Admission medications  Medication  Sig Start Date End Date Taking? Authorizing Provider  clotrimazole  (LOTRIMIN ) 1 % cream Apply to affected area 2 times daily. Use until 3 days after symptoms resolved 03/04/24  Yes Arvis Huxley B, PA-C  alendronate  (FOSAMAX ) 70 MG tablet TAKE 1 TABLET EVERY 7 DAYS. TAKE WITH A FULL GLASS OF WATER ON AN EMPTY STOMACH. 01/15/24   Vicci Bouchard P, DO  clobetasol  ointment (TEMOVATE ) 0.05 % Apply 1 Application topically 2 (two) times daily. Do not use for more than 1 week at a time. 02/06/24   Brimage, Vondra, DO  empagliflozin  (JARDIANCE ) 10 MG TABS tablet Take 1 tablet (10 mg total) by mouth daily. Patient not taking: Reported on 02/12/2024 12/03/22   Vicci Bouchard P, DO  losartan  (COZAAR ) 50 MG tablet Take 1 tablet (50 mg total) by mouth daily. Per Cardiologist 10/13/23   Vicci Bouchard P, DO  omeprazole  (PRILOSEC) 20 MG capsule Take 1 capsule (20 mg total) by mouth daily. 10/13/23   Vicci Bouchard P, DO  propranolol  ER (INDERAL  LA) 80 MG 24 hr capsule Take 1 capsule (80 mg total) by mouth daily. 10/13/23   Johnson, Megan P, DO  traMADol  (ULTRAM ) 50 MG tablet Take 50 mg by mouth every 6 (six) hours as needed. 11/08/23   [provider]  venlafaxine  XR (EFFEXOR  XR) 150 MG 24 hr capsule Take 1 capsule (150 mg total) by mouth daily with breakfast. 10/13/23   Vicci Bouchard P, DO  venlafaxine  XR (EFFEXOR -XR)  75 MG 24 hr capsule TAKE 1 CAPSULE (75 MG TOTAL) BY MOUTH DAILY WITH BREAKFAST. TAKE WITH 150MG  FOR 225MG  TOTAL DAILY Patient not taking: Reported on 02/12/2024 01/09/24   Vicci Duwaine SQUIBB, DO    Family History Family History  Problem Relation Age of Onset   Congestive Heart Failure Mother    Cancer Sister        breast   Diabetes Daughter     Social History Social History[1]   Allergies   Latex and Nickel   Review of Systems Review of Systems  Constitutional:  Negative for fatigue and fever.  Skin:  Positive for color change, rash and wound.     Physical Exam Triage Vital  Signs ED Triage Vitals  Encounter Vitals Group     BP      Girls Systolic BP Percentile      Girls Diastolic BP Percentile      Boys Systolic BP Percentile      Boys Diastolic BP Percentile      Pulse      Resp      Temp      Temp src      SpO2      Weight      Height      Head Circumference      Peak Flow      Pain Score      Pain Loc      Pain Education      Exclude from Growth Chart    No data found.  Updated Vital Signs BP 136/87 (BP Location: Left Arm)   Pulse 99   Temp 98.3 F (36.8 C) (Oral)   Resp 17   SpO2 100%     Physical Exam Vitals and nursing note reviewed.  Constitutional:      General: She is not in acute distress.    Appearance: Normal appearance. She is not ill-appearing or toxic-appearing.  HENT:     Head: Normocephalic and atraumatic.  Eyes:     General: No scleral icterus.       Right eye: No discharge.        Left eye: No discharge.     Conjunctiva/sclera: Conjunctivae normal.  Cardiovascular:     Rate and Rhythm: Normal rate.  Pulmonary:     Effort: Pulmonary effort is normal. No respiratory distress.  Musculoskeletal:     Cervical back: Neck supple.  Skin:    General: Skin is dry.     Findings: Rash present.     Comments: See image included in chart. There is an area of erythema and macerated skin of lower abdomen.  Neurological:     General: No focal deficit present.     Mental Status: She is alert. Mental status is at baseline.     Motor: No weakness.     Gait: Gait normal.  Psychiatric:        Mood and Affect: Mood normal.        Behavior: Behavior normal.      UC Treatments / Results  Labs (all labs ordered are listed, but only abnormal results are displayed) Labs Reviewed - No data to display  EKG   Radiology No results found.  Procedures Procedures (including critical care time)  Medications Ordered in UC Medications - No data to display  Initial Impression / Assessment and Plan / UC Course  I have  reviewed the triage vital signs and the nursing notes.  Pertinent labs & imaging results  that were available during my care of the patient were reviewed by me and considered in my medical decision making (see chart for details).   79 y/o female with history of psoriasis presents for rash of lower abdomen. Seen here 1 month ago for psoriasis flare up and treated with dexamethasone  IM injection and topical clobetasol .  See image included in chart. Rash is consistent with intertrigo. Possible fungal infection. Sent clotrimazole . Discussed practicing good hygiene and importance of keeping area dry. Reviewed return precautions.    Final Clinical Impressions(s) / UC Diagnoses   Final diagnoses:  Intertrigo     Discharge Instructions      - Rash on your abdomen is consistent with intertrigo.  This happens when skin rubs together.  It can also be related to underlying fungal infection. - I sent a topical antifungal cream to the pharmacy. - Keep the area clean and dry. - Consider some sort of barrier so that your skin is not rubbing together.  Can try putting a bandage over the area during part of the day and letting it air out at night when you are laying down. - This can take a couple weeks to resolve but you should return if symptoms are worsening.     ED Prescriptions     Medication Sig Dispense Auth. Provider   clotrimazole  (LOTRIMIN ) 1 % cream Apply to affected area 2 times daily. Use until 3 days after symptoms resolved 42 g Arvis Jolan NOVAK, PA-C      PDMP not reviewed this encounter.     [1]  Social History Tobacco Use   Smoking status: Former    Current packs/day: 0.00    Average packs/day: 1.3 packs/day for 24.0 years (30.0 ttl pk-yrs)    Types: Cigarettes    Start date: 08/28/1984    Quit date: 08/28/2008    Years since quitting: 15.5   Smokeless tobacco: Never  Vaping Use   Vaping status: Never Used  Substance Use Topics   Alcohol use: Yes    Alcohol/week: 14.0  standard drinks of alcohol    Types: 14 Glasses of wine per week    Comment: 2 glasses of wine nightly   Drug use: No     Arvis Jolan NOVAK, PA-C 03/04/24 1451  "

## 2024-03-04 NOTE — Discharge Instructions (Addendum)
-   Rash on your abdomen is consistent with intertrigo.  This happens when skin rubs together.  It can also be related to underlying fungal infection. - I sent a topical antifungal cream to the pharmacy. - Keep the area clean and dry. - Consider some sort of barrier so that your skin is not rubbing together.  Can try putting a bandage over the area during part of the day and letting it air out at night when you are laying down. - This can take a couple weeks to resolve but you should return if symptoms are worsening.

## 2024-03-04 NOTE — ED Triage Notes (Signed)
 Patient was seen for an abdominal rash 3 weeks ago. States there is now a bright red, draining rash along her old hysterectomy scar. First noticed the rash and weeping today.   Has not used anything for the rash today.

## 2024-04-02 ENCOUNTER — Encounter: Admitting: Family Medicine

## 2025-02-17 ENCOUNTER — Ambulatory Visit
